# Patient Record
Sex: Female | Born: 1960 | Race: White | Hispanic: No | Marital: Married | Smoking: Never smoker
Health system: Southern US, Community
[De-identification: ages and names within clinical notes are randomized; demographics above are authoritative.]

## PROBLEM LIST (undated history)

## (undated) DIAGNOSIS — I480 Paroxysmal atrial fibrillation: Secondary | ICD-10-CM

## (undated) DIAGNOSIS — R0600 Dyspnea, unspecified: Secondary | ICD-10-CM

## (undated) DIAGNOSIS — I499 Cardiac arrhythmia, unspecified: Secondary | ICD-10-CM

## (undated) DIAGNOSIS — G709 Myoneural disorder, unspecified: Secondary | ICD-10-CM

## (undated) DIAGNOSIS — J189 Pneumonia, unspecified organism: Secondary | ICD-10-CM

## (undated) DIAGNOSIS — M1990 Unspecified osteoarthritis, unspecified site: Secondary | ICD-10-CM

---

## 1998-04-30 ENCOUNTER — Other Ambulatory Visit: Admission: RE | Admit: 1998-04-30 | Discharge: 1998-04-30 | Payer: Self-pay | Admitting: *Deleted

## 2001-09-24 ENCOUNTER — Other Ambulatory Visit: Admission: RE | Admit: 2001-09-24 | Discharge: 2001-09-24 | Payer: Self-pay | Admitting: *Deleted

## 2002-10-07 ENCOUNTER — Other Ambulatory Visit: Admission: RE | Admit: 2002-10-07 | Discharge: 2002-10-07 | Payer: Self-pay | Admitting: *Deleted

## 2002-10-17 ENCOUNTER — Encounter: Admission: RE | Admit: 2002-10-17 | Discharge: 2002-10-17 | Payer: Self-pay | Admitting: *Deleted

## 2003-03-13 ENCOUNTER — Encounter (INDEPENDENT_AMBULATORY_CARE_PROVIDER_SITE_OTHER): Payer: Self-pay | Admitting: Specialist

## 2003-03-13 ENCOUNTER — Ambulatory Visit (HOSPITAL_COMMUNITY): Admission: RE | Admit: 2003-03-13 | Discharge: 2003-03-13 | Payer: Self-pay | Admitting: *Deleted

## 2004-02-29 ENCOUNTER — Encounter: Admission: RE | Admit: 2004-02-29 | Discharge: 2004-02-29 | Payer: Self-pay | Admitting: *Deleted

## 2005-03-28 ENCOUNTER — Encounter: Admission: RE | Admit: 2005-03-28 | Discharge: 2005-03-28 | Payer: Self-pay | Admitting: *Deleted

## 2006-04-12 ENCOUNTER — Encounter: Admission: RE | Admit: 2006-04-12 | Discharge: 2006-04-12 | Payer: Self-pay | Admitting: *Deleted

## 2008-09-24 ENCOUNTER — Encounter: Admission: RE | Admit: 2008-09-24 | Discharge: 2008-09-24 | Payer: Self-pay | Admitting: Obstetrics

## 2009-09-27 ENCOUNTER — Encounter: Admission: RE | Admit: 2009-09-27 | Discharge: 2009-09-27 | Payer: Self-pay | Admitting: Obstetrics

## 2009-10-06 ENCOUNTER — Encounter: Admission: RE | Admit: 2009-10-06 | Discharge: 2009-10-06 | Payer: Self-pay | Admitting: Obstetrics

## 2010-01-04 ENCOUNTER — Encounter (INDEPENDENT_AMBULATORY_CARE_PROVIDER_SITE_OTHER): Payer: Self-pay | Admitting: *Deleted

## 2010-01-21 ENCOUNTER — Encounter (INDEPENDENT_AMBULATORY_CARE_PROVIDER_SITE_OTHER): Payer: Self-pay

## 2010-01-25 ENCOUNTER — Ambulatory Visit: Payer: Self-pay | Admitting: Gastroenterology

## 2010-02-16 ENCOUNTER — Ambulatory Visit: Payer: Self-pay | Admitting: Gastroenterology

## 2010-09-20 NOTE — Letter (Signed)
Summary: [facility] Instructions  [facility]  [address] [place], [place] [address]   Phone: [phone]  Fax: [phone]       Terri Drake    Jan 31, 1961    MRN: [id number]        Procedure Day Dorna Bloom:  [facility]  02/16/10     Arrival Time:  8:30AM     Procedure Time:  9:30AM     Location of Procedure:                    _X _  [facility] (4th Floor)                        PREPARATION FOR COLONOSCOPY WITH MOVIPREP   Starting 5 days prior to your procedure 02/11/10 do not eat nuts, seeds, popcorn, corn, beans, peas,  salads, or any raw vegetables.  Do not take any fiber supplements (e.g. Metamucil, Citrucel, and Benefiber).  THE DAY BEFORE YOUR PROCEDURE         DATE: 02/15/10  DAY: TUESDAY  1.  Drink clear liquids the entire day-NO SOLID FOOD  2.  Do not drink anything colored red or purple.  Avoid juices with pulp.  No orange juice.  3.  Drink at least 64 oz. (8 glasses) of fluid/clear liquids during the day to prevent dehydration and help the prep work efficiently.  CLEAR LIQUIDS INCLUDE: Water Jello Ice Popsicles Tea (sugar ok, no milk/cream) Powdered fruit flavored drinks Coffee (sugar ok, no milk/cream) Gatorade Juice: apple, white grape, white cranberry  Lemonade Clear bullion, consomm, broth Carbonated beverages (any kind) Strained chicken noodle soup Hard Candy                             4.  In the morning, mix first dose of MoviPrep solution:    Empty 1 Pouch A and 1 Pouch B into the disposable container    Add lukewarm drinking water to the top line of the container. Mix to dissolve    Refrigerate (mixed solution should be used within 24 hrs)  5.  Begin drinking the prep at 5:00 p.m. The MoviPrep container is divided by 4 marks.   Every 15 minutes drink the solution down to the next mark (approximately 8 oz) until the full liter is complete.   6.  Follow completed prep with 16 oz of clear liquid of your choice  (Nothing red or purple).  Continue to drink clear liquids until bedtime.  7.  Before going to bed, mix second dose of MoviPrep solution:    Empty 1 Pouch A and 1 Pouch B into the disposable container    Add lukewarm drinking water to the top line of the container. Mix to dissolve    Refrigerate  THE DAY OF YOUR PROCEDURE      DATE: 02/16/10  DAY: WEDNESDAY  Beginning at 4:30AM (5 hours before procedure):         1. Every 15 minutes, drink the solution down to the next mark (approx 8 oz) until the full liter is complete.  2. Follow completed prep with 16 oz. of clear liquid of your choice.    3. You may drink clear liquids until 7:30AM (2 HOURS BEFORE PROCEDURE).   MEDICATION INSTRUCTIONS  Unless otherwise instructed, you should take regular prescription medications with a small sip of water   as early as possible the morning  of your procedure.         OTHER INSTRUCTIONS  You will need a responsible adult at least 51 years of age to accompany you and drive you home.   This person must remain in the waiting room during your procedure.  Wear loose fitting clothing that is easily removed.  Leave jewelry and other valuables at home.  However, you may wish to bring a book to read or  an iPod/MP3 player to listen to music as you wait for your procedure to start.  Remove all body piercing jewelry and leave at home.  Total time from sign-in until discharge is approximately 2-3 hours.  You should go home directly after your procedure and rest.  You can resume normal activities the  day after your procedure.  The day of your procedure you should not:   Drive   Make legal decisions   Operate machinery   Drink alcohol   Return to work  You will receive specific instructions about eating, activities and medications before you leave.    The above instructions have been reviewed and explained to me by   Ulis Rias RN  January 25, 2010 10:14 AM     I fully understand  and can verbalize these instructions _____________________________ Date _________

## 2010-09-20 NOTE — Procedures (Signed)
Summary: Colonoscopy  Patient: [place] Spainhower Note: All result statuses are Final unless otherwise noted.  Tests: (1) Colonoscopy (COL)   COL Colonoscopy           DONE     [facility]     [address]     [place], [place]  [address]           COLONOSCOPY PROCEDURE REPORT           PATIENT:  Terri Drake, Terri Drake  MR#:  [id number]     BIRTHDATE:  04/18/1961, 49 yrs. old  GENDER:  female     ENDOSCOPIST:  Rachael Fee, MD     REF. BY:  Noland Fordyce, M.D.     PROCEDURE DATE:  02/16/2010     PROCEDURE:  Diagnostic Colonoscopy     ASA CLASS:  Class II     INDICATIONS:  Routine Risk Screening     MEDICATIONS:   Fentanyl 75 mcg IV, Versed 8 mg IV           DESCRIPTION OF PROCEDURE:   After the risks benefits and     alternatives of the procedure were thoroughly explained, informed     consent was obtained.  Digital rectal exam was performed and     revealed no rectal masses.   The LB PCF-H180AL [id number] endoscope     was introduced through the anus and advanced to the cecum, which     was identified by both the appendix and ileocecal valve, without     limitations.  The quality of the prep was good, using MoviPrep.     The instrument was then slowly withdrawn as the colon was fully     examined.     <<PROCEDUREIMAGES>>           FINDINGS:  A normal appearing cecum, ileocecal valve, and     appendiceal orifice were identified. The ascending, hepatic     flexure, transverse, splenic flexure, descending, sigmoid colon,     and rectum appeared unremarkable (see image1, image2, and image3).     Retroflexed views in the rectum revealed no abnormalities.    The     scope was then withdrawn from the patient and the procedure     completed.           COMPLICATIONS:  None           ENDOSCOPIC IMPRESSION:     1) Normal colon     2) No polyps or cancers           RECOMMENDATIONS:     1) Continue current colorectal screening recommendations for     "routine risk" patients  with a repeat colonoscopy in 10 years.           REPEAT EXAM:  10 years           ______________________________     Rachael Fee, MD           n.     eSIGNED:   Rachael Fee at 02/16/2010 09:20 AM           Carlynn Spry, [id number]  Note: An exclamation mark (!) indicates a result that was not dispersed into the flowsheet. Document Creation Date: 02/16/2010 9:20 AM _______________________________________________________________________  (1) Order result status: Final Collection or observation date-time: 02/16/2010 09:16 Requested date-time:  Receipt date-time:  Reported date-time:  Referring Physician:   Ordering Physician: Rob Bunting [phone]) Specimen Source:  Source: Launa Grill  Order Number: [id number] Lab site:   Appended Document: Colonoscopy    Clinical Lists Changes  Observations: Added new observation of COLONNXTDUE: 01/2020 (02/16/2010 12:12)

## 2010-09-20 NOTE — Letter (Signed)
Summary: Previsit letter  [facility]  [address] [place], [place] [address]   Phone: [phone]  Fax: [phone]       01/04/2010 MRN: [id number]  Terri Drake [address] [place], [place]  [address]  Dear Ms. Fofana,  Welcome to the Gastroenterology Division at [facility].    You are scheduled to see a nurse for your pre-procedure visit on 01/25/2010 at 10:00AM on the 3rd floor at [facility], [address]  We ask that you try to arrive at our office 15 minutes prior to your appointment time to allow for check-in.  Your nurse visit will consist of discussing your medical and surgical history, your immediate family medical history, and your medications.    Please bring a complete list of all your medications or, if you prefer, bring the medication bottles and we will list them.  We will need to be aware of both prescribed and over the counter drugs.  We will need to know exact dosage information as well.  If you are on blood thinners (Coumadin, Plavix, Aggrenox, Ticlid, etc.) please call our office today/prior to your appointment, as we need to consult with your physician about holding your medication.   Please be prepared to read and sign documents such as consent forms, a financial agreement, and acknowledgement forms.  If necessary, and with your consent, a friend or relative is welcome to sit-in on the nurse visit with you.  Please bring your insurance card so that we may make a copy of it.  If your insurance requires a referral to see a specialist, please bring your referral form from your primary care physician.  No co-pay is required for this nurse visit.     If you cannot keep your appointment, please call [phone] to cancel or reschedule prior to your appointment date.  This allows [place] the opportunity to schedule an appointment for another patient in need of care.    Thank you for choosing  Gastroenterology for your medical  needs.  We appreciate the opportunity to care for you.  Please visit [place] at our website  to learn more about our practice.                     Sincerely.                                                                                                                   The Gastroenterology Division

## 2010-09-20 NOTE — Miscellaneous (Signed)
Summary: Lec previsit  Clinical Lists Changes  Medications: Added new medication of MOVIPREP 100 GM  SOLR (PEG-KCL-NACL-NASULF-NA ASC-C) As per prep instructions. - Signed Rx of MOVIPREP 100 GM  SOLR (PEG-KCL-NACL-NASULF-NA ASC-C) As per prep instructions.;  #1 x 0;  Signed;  Entered by: Ulis Rias RN;  Authorized by: Rachael Fee MD;  Method used: Electronically to [facility]. #[id number]*, [address] [place], [place], [place]  [address], Ph: [phone], Fax: [phone] Allergies: Added new allergy or adverse reaction of * SULFUR Observations: Added new observation of NKA: F (01/25/2010 9:48)    Prescriptions: MOVIPREP 100 GM  SOLR (PEG-KCL-NACL-NASULF-NA ASC-C) As per prep instructions.  #1 x 0   Entered by:   Ulis Rias RN   Authorized by:   Rachael Fee MD   Signed by:   Ulis Rias RN on 01/25/2010   Method used:   Electronically to        [facility]. [phone]* (retail)       [address]       [place], [place]  [address]       Ph: [phone]       Fax: [phone]   RxID:   [id number]

## 2011-01-06 NOTE — Op Note (Signed)
NAME:  Terri Drake, Terri Drake                       ACCOUNT NO.:  [id number]   MEDICAL RECORD NO.:  [id number]                   PATIENT TYPE:  AMB   LOCATION:  DAY                                  FACILITY:  [facility]   PHYSICIAN:  Pershing Cox, M.D.            DATE OF BIRTH:  Mar 30, 1961   DATE OF PROCEDURE:  03/13/2003  DATE OF DISCHARGE:                                 OPERATIVE REPORT   PREOPERATIVE DIAGNOSES:  1. Dysfunctional uterine bleeding.  2. Abnormal hydrosonogram with suggested submucosal myoma and endometrial     polyp.   POSTOPERATIVE DIAGNOSES:  1. Dysfunctional uterine bleeding.  2. Normal endometrial cavity.  3. No evidence of submucosal myoma.   PROCEDURES:  1. Exam under anesthesia.  2. Fractional D&C hysteroscopy.   SURGEON:  Pershing Cox, M.D.   ANESTHESIA:  MAC plus Marcaine paracervical block.   SPECIMENS:  1. Endocervical curettings.  2. Endometrial curettings.   INDICATIONS FOR PROCEDURE:  Since March of this year, the patient has had  two menstrual cycles.  Each of these lasted 7-8 days, and then there was  bleeding between the cycles lasting 3-4 days.  She had a hydrosonogram  performed in my office which showed a very thickened lining of the anterior  uterine wall and question of a submucosal myoma in the lower uterine  segment.  This led [place] today to the operating room to assess the presence of  a submucosal myoma and to remove any small polyps.   FINDINGS:  Three inch cavity, no submucosal myoma seen, fragments of  polypoid endometrium submitted for D&C.  No uterine descensus.  The  patient's uterus is anteverted.  It is approximately eight weeks in size.  No uterine myomas were palpated on an exam under anesthesia, and no uterine  myomas were seen in the endometrial canal.  Both tubal ostia were seen.  There were fragments of polypoid endometrium, and attempts were made to  curette all of these fragments at the end of the procedure.   Photographs  were taken to document our activities.   DESCRIPTION OF PROCEDURE:  Terri Drake was brought to the operating room  with an IV in place.  She received 1 g of Ancef in the holding area.  Supine  on the OR table, IV sedation was administered.  General anesthesia was  established by placement of an LMA without difficulty.  The patient was  placed into Allen stirrups.  Exam under anesthesia was performed.  Hibiclens  was used to prep the anterior abdominal wall, perineum, and vagina.  The  patient was draped for a sterile vaginal procedure.   Bivalve speculum was inserted to the vagina.  Cervix was visualized.  It was  grasped with a single-tooth tenaculum and 0.25% Marcaine was used to  administer a paracervical block to the 3, 4, 7, and 8 positions.  Kevorkian  curette was used to obtain endocervical curettings onto  a Telfa.  Serial  Pratt dilators were used to dilate the cervix to size 31 Pratt.  The  hysteroscope was then inserted.  Using using through-and-through sorbitol  irrigation, the cavity was visualized and photographed.  Seeing no evidence  of large polyp and no evidence of submucosal myoma, the hysteroscope was  removed.  Small sharp curette was used to curette the wall in a serial  fashion.  The serrated curette was then used to curette the walls as well.  There was a feeling of a sliding piece of tissue on the anterior uterine  wall, and so I curetted this vigorously and used the Randall-Stone forceps  to try to explore for polypoid fragments.  All of the resected material was  placed on Telfa and sent to pathology for diagnosis.  The procedure was  terminated after the uterine passed again to a depth of three inches.  There  was no evidence of perforation during this procedure.  The patient was taken  to the recovery room in excellent condition where she will be discharged to  home.                                               Pershing Cox,  M.D.    MAJ/MEDQ  D:  03/13/2003  T:  03/13/2003  Job:  [id number]

## 2011-02-03 ENCOUNTER — Other Ambulatory Visit: Payer: Self-pay | Admitting: Obstetrics

## 2011-02-03 DIAGNOSIS — Z1231 Encounter for screening mammogram for malignant neoplasm of breast: Secondary | ICD-10-CM

## 2011-03-01 ENCOUNTER — Ambulatory Visit
Admission: RE | Admit: 2011-03-01 | Discharge: 2011-03-01 | Disposition: A | Discharge status: Home / Self Care | Payer: BC Managed Care – PPO | Source: Ambulatory Visit | Attending: Obstetrics | Admitting: Obstetrics

## 2011-03-01 DIAGNOSIS — Z1231 Encounter for screening mammogram for malignant neoplasm of breast: Secondary | ICD-10-CM

## 2011-03-21 ENCOUNTER — Other Ambulatory Visit: Payer: Self-pay | Admitting: Podiatry

## 2011-03-21 DIAGNOSIS — M79672 Pain in left foot: Secondary | ICD-10-CM

## 2011-03-21 DIAGNOSIS — M25572 Pain in left ankle and joints of left foot: Secondary | ICD-10-CM

## 2011-03-27 ENCOUNTER — Other Ambulatory Visit: Payer: BC Managed Care – PPO

## 2011-03-29 ENCOUNTER — Ambulatory Visit
Admission: RE | Admit: 2011-03-29 | Discharge: 2011-03-29 | Disposition: A | Discharge status: Home / Self Care | Payer: BC Managed Care – PPO | Source: Ambulatory Visit | Attending: Podiatry | Admitting: Podiatry

## 2011-03-29 DIAGNOSIS — M25572 Pain in left ankle and joints of left foot: Secondary | ICD-10-CM

## 2011-03-29 DIAGNOSIS — M79672 Pain in left foot: Secondary | ICD-10-CM

## 2012-10-29 ENCOUNTER — Other Ambulatory Visit: Payer: Self-pay

## 2012-10-29 DIAGNOSIS — Z1231 Encounter for screening mammogram for malignant neoplasm of breast: Secondary | ICD-10-CM

## 2012-11-28 ENCOUNTER — Ambulatory Visit
Admission: RE | Admit: 2012-11-28 | Discharge: 2012-11-28 | Disposition: A | Discharge status: Home / Self Care | Payer: BC Managed Care – PPO | Source: Ambulatory Visit

## 2012-11-28 DIAGNOSIS — Z1231 Encounter for screening mammogram for malignant neoplasm of breast: Secondary | ICD-10-CM

## 2013-03-28 ENCOUNTER — Other Ambulatory Visit: Payer: Self-pay | Admitting: Orthopaedic Surgery

## 2013-03-28 DIAGNOSIS — M25531 Pain in right wrist: Secondary | ICD-10-CM

## 2013-04-04 ENCOUNTER — Ambulatory Visit
Admission: RE | Admit: 2013-04-04 | Discharge: 2013-04-04 | Disposition: A | Discharge status: Home / Self Care | Payer: BC Managed Care – PPO | Source: Ambulatory Visit | Attending: Orthopaedic Surgery | Admitting: Orthopaedic Surgery

## 2013-04-04 DIAGNOSIS — M25531 Pain in right wrist: Secondary | ICD-10-CM

## 2013-04-04 MED ORDER — IOHEXOL 180 MG/ML  SOLN
7.5000 mL | Freq: Once | INTRAMUSCULAR | Status: AC | PRN
  Administered 2013-04-04: 7.5 mL via INTRA_ARTICULAR

## 2013-12-23 ENCOUNTER — Other Ambulatory Visit: Payer: Self-pay

## 2013-12-23 DIAGNOSIS — Z1231 Encounter for screening mammogram for malignant neoplasm of breast: Secondary | ICD-10-CM

## 2014-01-05 ENCOUNTER — Ambulatory Visit
Admission: RE | Admit: 2014-01-05 | Discharge: 2014-01-05 | Disposition: A | Discharge status: Home / Self Care | Payer: BC Managed Care – PPO | Source: Ambulatory Visit

## 2014-01-05 DIAGNOSIS — Z1231 Encounter for screening mammogram for malignant neoplasm of breast: Secondary | ICD-10-CM

## 2014-05-19 ENCOUNTER — Encounter: Payer: Self-pay | Admitting: Gastroenterology

## 2014-08-25 ENCOUNTER — Other Ambulatory Visit: Payer: Self-pay | Admitting: Family Medicine

## 2014-08-25 ENCOUNTER — Ambulatory Visit
Admission: RE | Admit: 2014-08-25 | Discharge: 2014-08-25 | Disposition: A | Discharge status: Home / Self Care | Payer: BC Managed Care – PPO | Source: Ambulatory Visit | Attending: Family Medicine | Admitting: Family Medicine

## 2014-08-25 DIAGNOSIS — M545 Low back pain: Secondary | ICD-10-CM

## 2021-01-13 ENCOUNTER — Telehealth: Payer: Self-pay | Admitting: Cardiovascular Disease

## 2021-01-13 NOTE — Telephone Encounter (Signed)
Dr. Excell Seltzer personally spoke with Dr. Luciana Axe. Per Dr. Luciana Axe, HR in 130s, EKG shows afib/flutter. She will start the patient on Eliquis 5 mg BID, Toprol 25 mg daily, and will arrange AFib Clinic visit.  Med list updated.  Will call the patient tomorrow to confirm appointment 6/1 at 1400 and give directions.    AFIB CLINIC INFORMATION: Your appointment is scheduled on: 01/19/21 at 2:00PM Please arrive 15 minutes early for check-in. The AFib Clinic is located in the Heart and Vascular [facility]ty Clinics at [facility]. Parking instructions/directions: Government social research [address] ([address]). When you pull in to Entrance C, there is an underground parking garage to your right. The code to enter the garage is 4233. Take the elevators to the first floor. Follow the signs to the Heart and Vascular Specialty Clinics. You will see registration at the end of the hallway.  Phone number: [phone]

## 2021-01-13 NOTE — Telephone Encounter (Signed)
Spoke with Dr Luciana Axe. Pt completely asymptomatic. Recommendations as outlined.

## 2021-01-13 NOTE — Telephone Encounter (Signed)
Dr. Luciana Axe calling to speak with DOD in regards to patient having afib flutter and being asymptomatic.

## 2021-01-14 NOTE — Telephone Encounter (Signed)
Spoke with pt and reviewed information about appt with AF Clinic.  Gave directions, parking code and phone number.  Pt appreciative for call.

## 2021-01-19 ENCOUNTER — Encounter (HOSPITAL_COMMUNITY): Payer: Self-pay | Admitting: Physician Assistant

## 2021-01-19 ENCOUNTER — Other Ambulatory Visit: Payer: Self-pay

## 2021-01-19 ENCOUNTER — Ambulatory Visit (HOSPITAL_COMMUNITY)
Admission: RE | Admit: 2021-01-19 | Discharge: 2021-01-19 | Disposition: A | Discharge status: Home / Self Care | Payer: BC Managed Care – PPO | Source: Ambulatory Visit | Attending: Physician Assistant | Admitting: Physician Assistant

## 2021-01-19 VITALS — BP 152/96 | HR 53 | Ht 66.75 in | Wt 187.2 lb

## 2021-01-19 DIAGNOSIS — R030 Elevated blood-pressure reading, without diagnosis of hypertension: Secondary | ICD-10-CM | POA: Insufficient documentation

## 2021-01-19 DIAGNOSIS — Z79899 Other long term (current) drug therapy: Secondary | ICD-10-CM | POA: Insufficient documentation

## 2021-01-19 DIAGNOSIS — I480 Paroxysmal atrial fibrillation: Secondary | ICD-10-CM | POA: Diagnosis present

## 2021-01-19 DIAGNOSIS — Z7901 Long term (current) use of anticoagulants: Secondary | ICD-10-CM | POA: Insufficient documentation

## 2021-01-19 DIAGNOSIS — Z882 Allergy status to sulfonamides status: Secondary | ICD-10-CM | POA: Diagnosis not present

## 2021-01-19 DIAGNOSIS — R001 Bradycardia, unspecified: Secondary | ICD-10-CM | POA: Insufficient documentation

## 2021-01-19 NOTE — Progress Notes (Addendum)
Primary Care Physician: Clayborn Heron, MD Primary Cardiologist: none Primary Electrophysiologist: none Referring Physician: Dr Oneida Arenas is a 60 y.o. female with a history of new onset atrial fibrillation who presents for consultation in the [facility].  The patient was initially diagnosed with atrial fibrillation 01/13/21 incidentally at a routine follow up at PCP. ECG showed afib with RVR. Patient is on Eliquis for a CHADS2VASC score of 1. She was also started on metoprolol for rate control. She denies significant snore but does have a history of alcohol and caffeine use. She is back in SR today.  Today, she denies symptoms of palpitations, chest pain, shortness of breath, orthopnea, PND, lower extremity edema, dizziness, presyncope, syncope, snoring, daytime somnolence, bleeding, or neurologic sequela. The patient is tolerating medications without difficulties and is otherwise without complaint today.    Atrial Fibrillation Risk Factors:  she does not have symptoms or diagnosis of sleep apnea. she does not have a history of rheumatic fever. she does have a history of alcohol use. The patient does not have a history of early familial atrial fibrillation or other arrhythmias.  she has a BMI of Body mass index is 29.54 kg/m.Marland Kitchen Filed Weights   01/19/21 1400  Weight: 84.9 kg    History reviewed. No pertinent family history.   Atrial Fibrillation Management history:  Previous antiarrhythmic drugs: none Previous cardioversions: none Previous ablations: none CHADS2VASC score: 1 Anticoagulation history: Eliquis   History reviewed. No pertinent past medical history. History reviewed. No pertinent surgical history.  Current Outpatient Medications  Medication Sig Dispense Refill  . apixaban (ELIQUIS) 5 MG TABS tablet Take 5 mg by mouth 2 (two) times daily.    Marland Kitchen latanoprost (XALATAN) 0.005 % ophthalmic solution 1 drop at  bedtime.    . metoprolol succinate (TOPROL-XL) 25 MG 24 hr tablet Take 25 mg by mouth daily.     No current facility-administered medications for this encounter.    Allergies  Allergen Reactions  . Elemental Sulfur     REACTION: hives  . Sulfa Antibiotics Rash    Social History   Socioeconomic History  . Marital status: Married    Spouse name: Not on file  . Number of children: Not on file  . Years of education: Not on file  . Highest education level: Not on file  Occupational History  . Not on file  Tobacco Use  . Smoking status: Not on file  . Smokeless tobacco: Not on file  Substance and Sexual Activity  . Alcohol use: Not on file  . Drug use: Not on file  . Sexual activity: Not on file  Other Topics Concern  . Not on file  Social History Narrative  . Not on file   Social Determinants of Health   Financial Resource Strain: Not on file  Food Insecurity: Not on file  Transportation Needs: Not on file  Physical Activity: Not on file  Stress: Not on file  Social Connections: Not on file  Intimate Partner Violence: Not on file     ROS- All systems are reviewed and negative except as per the HPI above.  Physical Exam: Vitals:   01/19/21 1400  BP: (!) 152/96  Pulse: (!) 53  Weight: 84.9 kg  Height: 5' 6.75" (1.695 m)    GEN- The patient is a well appearing female, alert and oriented x 3 today.   Head- normocephalic, atraumatic Eyes-  Sclera clear, conjunctiva pink Ears- hearing intact  Oropharynx- clear Neck- supple  Lungs- Clear to ausculation bilaterally, normal work of breathing Heart- Regular rate and rhythm, bradycardia, no murmurs, rubs or gallops  GI- soft, NT, ND, + BS Extremities- no clubbing, cyanosis, or edema MS- no significant deformity or atrophy Skin- no rash or lesion Psych- euthymic mood, full affect Neuro- strength and sensation are intact  Wt Readings from Last 3 Encounters:  01/19/21 84.9 kg    EKG today demonstrates   SB Vent. rate 53 BPM PR interval 162 ms QRS duration 78 ms QT/QTcB 468/439 ms  Epic records are reviewed at length today  Labs from PCP 01/13/21 Cr 0.88, K+ 4.6, AST 21, ALT 19 WBC 5.9, Hgb 14.8, Hct 43.2 TSH 2.18  CHA2DS2-VASc Score = 1  The patient's score is based upon: CHF History: No HTN History: No Diabetes History: No Stroke History: No Vascular Disease History: No Age Score: 0 Gender Score: 1      ASSESSMENT AND PLAN: 1. Paroxysmal Atrial Fibrillation (ICD10:  I48.0) The patient's CHA2DS2-VASc score is 1, indicating a 0.6% annual risk of stroke.   General education about afib provided and questions answered. We also discussed her stroke risk and the risks and benefits of anticoagulation. Check echocardiogram Continue Eliquis 5 mg BID for at least one month post spontaneous conversion. Continue Toprol 25 mg daily  2. Elevated BP BP mildly elevated today, she does not have a h/o HTN. Encouraged her to get home BP machine to trend over time.   Follow up in the AF clinic in one month.    Jorja Loa PA-C Afib Clinic [facility] [address] [place], [place] [address] [phone] 01/19/2021 3:58 PM

## 2021-01-19 NOTE — Addendum Note (Signed)
Encounter addended by: Danice Goltz, PA on: 01/19/2021 4:04 PM  Actions taken: Clinical Note Signed

## 2021-01-20 ENCOUNTER — Other Ambulatory Visit (HOSPITAL_COMMUNITY): Payer: Self-pay

## 2021-01-20 MED ORDER — METOPROLOL SUCCINATE ER 25 MG PO TB24: 25.0000 mg | ORAL_TABLET | Freq: Every day | ORAL | 1 refills | Status: DC

## 2021-02-24 ENCOUNTER — Ambulatory Visit (HOSPITAL_COMMUNITY)
Admission: RE | Admit: 2021-02-24 | Discharge: 2021-02-24 | Disposition: A | Discharge status: Home / Self Care | Payer: BC Managed Care – PPO | Source: Ambulatory Visit | Attending: Physician Assistant | Admitting: Physician Assistant

## 2021-02-24 ENCOUNTER — Other Ambulatory Visit: Payer: Self-pay

## 2021-02-24 DIAGNOSIS — I082 Rheumatic disorders of both aortic and tricuspid valves: Secondary | ICD-10-CM | POA: Diagnosis not present

## 2021-02-24 DIAGNOSIS — I119 Hypertensive heart disease without heart failure: Secondary | ICD-10-CM | POA: Insufficient documentation

## 2021-02-24 DIAGNOSIS — I480 Paroxysmal atrial fibrillation: Secondary | ICD-10-CM | POA: Diagnosis present

## 2021-02-24 LAB — ECHOCARDIOGRAM COMPLETE
Area-P 1/2: 2.67 cm2
S' Lateral: 2.7 cm
Single Plane A4C EF: 67.1 %

## 2021-02-24 NOTE — Progress Notes (Signed)
  Echocardiogram 2D Echocardiogram has been performed.  Terri Drake 02/24/2021, 2:36 PM

## 2021-03-03 ENCOUNTER — Encounter (HOSPITAL_COMMUNITY): Payer: Self-pay | Admitting: Physician Assistant

## 2021-03-03 ENCOUNTER — Other Ambulatory Visit: Payer: Self-pay

## 2021-03-03 ENCOUNTER — Ambulatory Visit (HOSPITAL_COMMUNITY)
Admission: RE | Admit: 2021-03-03 | Discharge: 2021-03-03 | Disposition: A | Discharge status: Home / Self Care | Payer: BC Managed Care – PPO | Source: Ambulatory Visit | Attending: Physician Assistant | Admitting: Physician Assistant

## 2021-03-03 VITALS — BP 136/90 | HR 49 | Ht 66.75 in | Wt 186.8 lb

## 2021-03-03 DIAGNOSIS — Z882 Allergy status to sulfonamides status: Secondary | ICD-10-CM | POA: Diagnosis not present

## 2021-03-03 DIAGNOSIS — Z8616 Personal history of COVID-19: Secondary | ICD-10-CM | POA: Insufficient documentation

## 2021-03-03 DIAGNOSIS — I480 Paroxysmal atrial fibrillation: Secondary | ICD-10-CM

## 2021-03-03 DIAGNOSIS — Z79899 Other long term (current) drug therapy: Secondary | ICD-10-CM | POA: Insufficient documentation

## 2021-03-03 DIAGNOSIS — Z7901 Long term (current) use of anticoagulants: Secondary | ICD-10-CM | POA: Insufficient documentation

## 2021-03-03 HISTORY — DX: Paroxysmal atrial fibrillation: I48.0

## 2021-03-03 MED ORDER — METOPROLOL SUCCINATE ER 25 MG PO TB24: 12.5000 mg | ORAL_TABLET | Freq: Every day | ORAL | 1 refills | Status: DC

## 2021-03-03 NOTE — Progress Notes (Signed)
Primary Care Physician: Clayborn Heron, MD Primary Cardiologist: none Primary Electrophysiologist: none Referring Physician: Dr Oneida Arenas is a 60 y.o. female with a history of new onset atrial fibrillation who presents for follow up in the [facility].  The patient was initially diagnosed with atrial fibrillation 01/13/21 incidentally at a routine follow up at PCP. ECG showed afib with RVR. Patient is on Eliquis for a CHADS2VASC score of 1. She was also started on metoprolol for rate control. She denies significant snore but does have a history of alcohol and caffeine use.   On follow up today, patient reports that she has done well since her last visit. She is in SR today. Echo showed normal EF 60-65%. She denies any bleeding issues on anticoagulation. Of note, she did have COVID since her previous visit.   Today, she denies symptoms of palpitations, chest pain, shortness of breath, orthopnea, PND, lower extremity edema, dizziness, presyncope, syncope, snoring, daytime somnolence, bleeding, or neurologic sequela. The patient is tolerating medications without difficulties and is otherwise without complaint today.    Atrial Fibrillation Risk Factors:  she does not have symptoms or diagnosis of sleep apnea. she does not have a history of rheumatic fever. she does have a history of alcohol use. The patient does not have a history of early familial atrial fibrillation or other arrhythmias.  she has a BMI of Body mass index is 29.48 kg/m.Marland Kitchen Filed Weights   03/03/21 1357  Weight: 84.7 kg     No family history on file.   Atrial Fibrillation Management history:  Previous antiarrhythmic drugs: none Previous cardioversions: none Previous ablations: none CHADS2VASC score: 1 Anticoagulation history: Eliquis   Past Medical History:  Diagnosis Date   Paroxysmal atrial fibrillation (HCC)    No past surgical history on file.  Current  Outpatient Medications  Medication Sig Dispense Refill   latanoprost (XALATAN) 0.005 % ophthalmic solution 1 drop at bedtime.     metoprolol succinate (TOPROL-XL) 25 MG 24 hr tablet Take 0.5 tablets (12.5 mg total) by mouth daily. 45 tablet 1   No current facility-administered medications for this encounter.    Allergies  Allergen Reactions   Elemental Sulfur     REACTION: hives   Sulfa Antibiotics Rash    Social History   Socioeconomic History   Marital status: Married    Spouse name: Not on file   Number of children: Not on file   Years of education: Not on file   Highest education level: Not on file  Occupational History   Not on file  Tobacco Use   Smoking status: Never   Smokeless tobacco: Never  Substance and Sexual Activity   Alcohol use: Yes    Alcohol/week: 2.0 standard drinks    Types: 2 Glasses of wine per week    Comment: 2 glasses nightly   Drug use: Never   Sexual activity: Not on file  Other Topics Concern   Not on file  Social History Narrative   Not on file   Social Determinants of Health   Financial Resource Strain: Not on file  Food Insecurity: Not on file  Transportation Needs: Not on file  Physical Activity: Not on file  Stress: Not on file  Social Connections: Not on file  Intimate Partner Violence: Not on file     ROS- All systems are reviewed and negative except as per the HPI above.  Physical Exam: Vitals:   03/03/21 1357  BP: 136/90  Pulse: (!) 49  Weight: 84.7 kg  Height: 5' 6.75" (1.695 m)    GEN- The patient is a well appearing female, alert and oriented x 3 today.   HEENT-head normocephalic, atraumatic, sclera clear, conjunctiva pink, hearing intact, trachea midline. Lungs- Clear to ausculation bilaterally, normal work of breathing Heart- Regular rate and rhythm, bradycardia, no murmurs, rubs or gallops  GI- soft, NT, ND, + BS Extremities- no clubbing, cyanosis, or edema MS- no significant deformity or atrophy Skin-  no rash or lesion Psych- euthymic mood, full affect Neuro- strength and sensation are intact   Wt Readings from Last 3 Encounters:  03/03/21 84.7 kg  01/19/21 84.9 kg    EKG today demonstrates  SB Vent. rate 49 BPM PR interval 152 ms QRS duration 74 ms QT/QTcB 486/439 ms  Echo 02/24/21 demonstrated   1. Left ventricular ejection fraction, by estimation, is 60 to 65%. The  left ventricle has normal function. The left ventricle has no regional  wall motion abnormalities. Left ventricular diastolic parameters were  normal.   2. Right ventricular systolic function is normal. The right ventricular  size is normal. Tricuspid regurgitation signal is inadequate for assessing  PA pressure.   3. The mitral valve is normal in structure. No evidence of mitral valve  regurgitation.   4. The aortic valve is tricuspid. Aortic valve regurgitation is trivial.  No aortic stenosis is present.   5. The inferior vena cava is normal in size with greater than 50%  respiratory variability, suggesting right atrial pressure of 3 mmHg.   Epic records are reviewed at length today  Labs from PCP 01/13/21 Cr 0.88, K+ 4.6, AST 21, ALT 19 WBC 5.9, Hgb 14.8, Hct 43.2 TSH 2.18  CHA2DS2-VASc Score = 1  The patient's score is based upon: CHF History: No HTN History: No Diabetes History: No Stroke History: No Vascular Disease History: No Age Score: 0 Gender Score: 1      ASSESSMENT AND PLAN: 1. Paroxysmal Atrial Fibrillation (ICD10:  I48.0) The patient's CHA2DS2-VASc score is 1, indicating a 0.6% annual risk of stroke.   Patient appears to be maintaining SR. Stop Eliquis given low CV score. Decrease Toprol to 12.5 mg daily    Follow up in the AF clinic in 6 months.    Jorja Loa PA-C Afib Clinic [facility] [address] [place], [place] [address] [phone] 03/03/2021 3:07 PM

## 2021-03-03 NOTE — Patient Instructions (Signed)
Stop Eliquis  Decrease metoprolol to 1/2 tablet once a day  Follow up in 6 months

## 2021-04-03 ENCOUNTER — Emergency Department (HOSPITAL_COMMUNITY)
Admission: EM | Admit: 2021-04-03 | Discharge: 2021-04-03 | Disposition: A | Discharge status: Home / Self Care | Payer: BC Managed Care – PPO | Attending: Emergency Medicine | Admitting: Emergency Medicine

## 2021-04-03 ENCOUNTER — Encounter (HOSPITAL_COMMUNITY): Payer: Self-pay | Admitting: Emergency Medicine

## 2021-04-03 ENCOUNTER — Emergency Department (HOSPITAL_COMMUNITY): Payer: BC Managed Care – PPO

## 2021-04-03 DIAGNOSIS — R110 Nausea: Secondary | ICD-10-CM | POA: Diagnosis not present

## 2021-04-03 DIAGNOSIS — R42 Dizziness and giddiness: Secondary | ICD-10-CM | POA: Diagnosis not present

## 2021-04-03 DIAGNOSIS — R002 Palpitations: Secondary | ICD-10-CM | POA: Diagnosis present

## 2021-04-03 DIAGNOSIS — Z7982 Long term (current) use of aspirin: Secondary | ICD-10-CM | POA: Diagnosis not present

## 2021-04-03 DIAGNOSIS — I480 Paroxysmal atrial fibrillation: Secondary | ICD-10-CM | POA: Insufficient documentation

## 2021-04-03 DIAGNOSIS — Z20822 Contact with and (suspected) exposure to covid-19: Secondary | ICD-10-CM | POA: Insufficient documentation

## 2021-04-03 LAB — CBC
HCT: 45 % (ref 36.0–46.0)
Hemoglobin: 15.7 g/dL — ABNORMAL HIGH (ref 12.0–15.0)
MCH: 34.3 pg — ABNORMAL HIGH (ref 26.0–34.0)
MCHC: 34.9 g/dL (ref 30.0–36.0)
MCV: 98.3 fL (ref 80.0–100.0)
Platelets: 240 10*3/uL (ref 150–400)
RBC: 4.58 MIL/uL (ref 3.87–5.11)
RDW: 12.8 % (ref 11.5–15.5)
WBC: 7.7 10*3/uL (ref 4.0–10.5)
nRBC: 0 % (ref 0.0–0.2)

## 2021-04-03 LAB — RESP PANEL BY RT-PCR (FLU A&B, COVID) ARPGX2
Influenza A by PCR: NEGATIVE
Influenza B by PCR: NEGATIVE
SARS Coronavirus 2 by RT PCR: NEGATIVE

## 2021-04-03 LAB — TROPONIN I (HIGH SENSITIVITY): Troponin I (High Sensitivity): 3 ng/L (ref <18)

## 2021-04-03 LAB — T4, FREE: Free T4: 1.09 ng/dL (ref 0.61–1.12)

## 2021-04-03 LAB — TSH: TSH: 0.695 u[IU]/mL (ref 0.350–4.500)

## 2021-04-03 MED ORDER — ASPIRIN 325 MG PO TABS
325.0000 mg | ORAL_TABLET | Freq: Once | ORAL | Status: AC
  Administered 2021-04-03: 325 mg via ORAL
  Filled 2021-04-03: qty 1

## 2021-04-03 MED ORDER — ASPIRIN 81 MG PO CHEW: 81.0000 mg | CHEWABLE_TABLET | Freq: Every day | ORAL | 0 refills | Status: DC

## 2021-04-03 MED ORDER — METOPROLOL TARTRATE 25 MG PO TABS
25.0000 mg | ORAL_TABLET | Freq: Once | ORAL | Status: AC
  Administered 2021-04-03: 25 mg via ORAL
  Filled 2021-04-03: qty 1

## 2021-04-03 MED ORDER — DILTIAZEM HCL-DEXTROSE 125-5 MG/125ML-% IV SOLN (PREMIX): 5.0000 mg/h | INTRAVENOUS | Status: DC

## 2021-04-03 MED ORDER — METOPROLOL SUCCINATE ER 25 MG PO TB24: 25.0000 mg | ORAL_TABLET | Freq: Every day | ORAL | 1 refills | Status: DC

## 2021-04-03 NOTE — ED Notes (Signed)
Urine sample at bedside

## 2021-04-03 NOTE — Discharge Instructions (Addendum)
Please follow up with AFIB clinic in next 24 hours. Thanks!

## 2021-04-03 NOTE — ED Notes (Signed)
Spoke with MD, decided to cancel dilt drip due to HR being better and second troponin due to first being regular

## 2021-04-03 NOTE — ED Triage Notes (Signed)
Pt bib ems from [facility] c/o afib rvr. Pt was recently dx afib in May 2022. She was placed on a beta blocker and blood thinner at the time. The beta blocker was reduced July 14th and the blood thinners were stopped. She presented with dizziness, diaphoresis, and nauseous during onset of afib rvr HR 140. Pt denies chest pain and sob.   BP 164/100 CBG 114 Room air 98%

## 2021-04-03 NOTE — ED Provider Notes (Signed)
[facility] EMERGENCY DEPARTMENT Provider Note   CSN: [id number] Arrival date & time: 04/03/21  1315     History No chief complaint on file.   Terri Drake is a 60 y.o. female.  History of atrial fibrillation, not currently on anticoagulation due to discontinue proxy 1 month ago atrial fibrillation clinic.  She is on metoprolol for rate control.  She was sent to emergency department from clinic secondary to elevated heart rate, diaphoretic, nauseated, lightheaded. Room spinning sensation, worsened with head moving or ambulating; improved with rest and sitting still. Symptom onset yesterday afternoon after eating lunch. She has been compliant with her home medications. Last took beta blocker this morning. She has ongoing chest discomfort, palpations. No dyspnea, leg swelling. No fevers or chills. No longer nauseated.   The history is provided by the patient. No language interpreter was used.      Past Medical History:  Diagnosis Date   Paroxysmal atrial fibrillation [facility])     Patient Active Problem List   Diagnosis Date Noted   Paroxysmal atrial fibrillation (HCC) 01/19/2021    History reviewed. No pertinent surgical history.   OB History   No obstetric history on file.     History reviewed. No pertinent family history.  Social History   Tobacco Use   Smoking status: Never   Smokeless tobacco: Never  Substance Use Topics   Alcohol use: Yes    Alcohol/week: 2.0 standard drinks    Types: 2 Glasses of wine per week    Comment: 2 glasses nightly   Drug use: Never    Home Medications Prior to Admission medications   Medication Sig Start Date End Date Taking? Authorizing Provider  aspirin 81 MG chewable tablet Chew 1 tablet (81 mg total) by mouth daily. 04/03/21 05/03/21 Yes Tanda Rockers A, DO  latanoprost (XALATAN) 0.005 % ophthalmic solution Place 1 drop into both eyes at bedtime. 01/07/21  Yes [provider]  metoprolol succinate  (TOPROL-XL) 25 MG 24 hr tablet Take 1 tablet (25 mg total) by mouth daily. 04/03/21   Sloan Leiter, DO    Allergies    Elemental sulfur and Sulfa antibiotics  Review of Systems   Review of Systems  Constitutional:  Positive for diaphoresis. Negative for chills and fever.  HENT:  Negative for facial swelling and trouble swallowing.   Eyes:  Negative for photophobia and visual disturbance.  Respiratory:  Negative for cough and shortness of breath.   Cardiovascular:  Positive for chest pain. Negative for palpitations.  Gastrointestinal:  Positive for nausea. Negative for abdominal pain and vomiting.  Endocrine: Negative for polydipsia and polyuria.  Genitourinary:  Negative for difficulty urinating and hematuria.  Musculoskeletal:  Negative for gait problem and joint swelling.  Skin:  Negative for pallor and rash.  Neurological:  Positive for light-headedness. Negative for syncope and headaches.  Psychiatric/Behavioral:  Negative for agitation and confusion.    Physical Exam Updated Vital Signs BP 121/81   Pulse 99   Temp (!) 97.5 F (36.4 C) (Oral)   Resp 15   SpO2 98%   Physical Exam Vitals and nursing note reviewed.  Constitutional:      General: She is not in acute distress.    Appearance: Normal appearance.  HENT:     Head: Normocephalic and atraumatic.     Right Ear: External ear normal.     Left Ear: External ear normal.     Nose: Nose normal.     Mouth/Throat:  Mouth: Mucous membranes are moist.  Eyes:     General: No scleral icterus.       Right eye: No discharge.        Left eye: No discharge.     Extraocular Movements: Extraocular movements intact.     Pupils: Pupils are equal, round, and reactive to light.  Cardiovascular:     Rate and Rhythm: Tachycardia present. Rhythm irregular.     Pulses: Normal pulses.     Heart sounds: Normal heart sounds.  Pulmonary:     Effort: Pulmonary effort is normal. No respiratory distress.     Breath sounds: Normal  breath sounds.  Abdominal:     General: Abdomen is flat.     Tenderness: There is no abdominal tenderness.  Musculoskeletal:        General: Normal range of motion.     Cervical back: Normal range of motion.     Right lower leg: No edema.     Left lower leg: No edema.  Skin:    General: Skin is warm and dry.     Capillary Refill: Capillary refill takes less than 2 seconds.  Neurological:     Mental Status: She is alert and oriented to person, place, and time.     GCS: GCS eye subscore is 4. GCS verbal subscore is 5. GCS motor subscore is 6.     Cranial Nerves: Cranial nerves are intact.     Sensory: Sensation is intact.     Motor: Motor function is intact.     Coordination: Coordination is intact.     Gait: Gait is intact.  Psychiatric:        Mood and Affect: Mood normal.        Behavior: Behavior normal.    ED Results / Procedures / Treatments   Labs (all labs ordered are listed, but only abnormal results are displayed) Labs Reviewed  CBC - Abnormal; Notable for the following components:      Result Value   Hemoglobin 15.7 (*)    MCH 34.3 (*)    All other components within normal limits  RESP PANEL BY RT-PCR (FLU A&B, COVID) [id number]  TSH  T4, FREE  TROPONIN I (HIGH SENSITIVITY)    EKG EKG Interpretation  Date/Time:  Sunday April 03 2021 13:22:42 EDT Ventricular Rate:  134 PR Interval:    QRS Duration: 80 QT Interval:  305 QTC Calculation: 456 R Axis:   72 Text Interpretation: Atrial fibrillation with RVR Borderline repolarization abnormality Confirmed by Tanda Rockers ([id number]) on 04/03/2021 1:27:23 PM  Radiology DG Chest Port 1 View  Result Date: 04/03/2021 CLINICAL DATA:  Atrial fibrillation, diaphoresis, chest pain. EXAM: PORTABLE CHEST 1 VIEW COMPARISON:  None. FINDINGS: Heart size and mediastinal contours are within normal limits. Coarse interstitial markings bilaterally, basilar predominant, of uncertain chronicity. No pleural effusion or pneumothorax is seen.  Osseous structures about the chest are unremarkable. IMPRESSION: 1. Coarse interstitial markings bilaterally, basilar predominant, without prior exams to establish chronicity, compatible with either chronic interstitial lung disease/fibrosis or acute edema of infectious or inflammatory etiology. 2. No evidence of consolidating pneumonia or alveolar pulmonary edema. Electronically Signed   By: Bary Richard M.D.   On: 04/03/2021 13:49    Procedures .Critical Care  Date/Time: 04/03/2021 9:25 PM Performed by: Sloan Leiter, DO Authorized by: Sloan Leiter, DO   Critical care provider statement:    Critical care time (minutes):  32   Critical care time was exclusive of:  Separately billable procedures and treating other patients   Critical care was necessary to treat or prevent imminent or life-threatening deterioration of the following conditions: atrial fibrillation with RVR.   Critical care was time spent personally by me on the following activities:  Discussions with consultants, evaluation of patient's response to treatment, examination of patient, ordering and performing treatments and interventions, ordering and review of laboratory studies, ordering and review of radiographic studies, pulse oximetry, re-evaluation of patient's condition, obtaining history from patient or surrogate and review of old charts   Medications Ordered in ED Medications  metoprolol tartrate (LOPRESSOR) tablet 25 mg (25 mg Oral Given 04/03/21 1406)  aspirin tablet 325 mg (325 mg Oral Given 04/03/21 1357)    ED Course  I have reviewed the triage vital signs and the nursing notes.  Pertinent labs & imaging results that were available during my care of the patient were reviewed by me and considered in my medical decision making (see chart for details).    MDM Rules/Calculators/A&P                           This is 60 yo female with history as above presenting to ER with chest pain/ palpitations/ afib with RVR.  Symptoms have improved since arrival. Vital signs reviewed, hr remains elevated, BP is stable. Her mentation is appropriate. Serious etiology considered.   ECG with afib with RVR.  No troponin leak, symptoms ongoing >3 hours. No chest pain. CXR reviewed and is stable.   Labs reviewed otherwise stable  Given Lopressor, her rate is controlled around 90. CHADSVASC score is 1, she was previously on eliquis, will give her ASA. Advised to take this daily. Will not restart eliquis at this time and will defer to afib clinc/cards as they recently d/c's it. Advised her to resume increased lopressor dose going forward (25mg  from 12.5mg  qd), daily asa, and to f/u with cardiology tomorrow in office. She is agreeable. Will RTED if unable to see cardiology.  She is ambulatory, tolerating PO, asymptomatic.   The patient improved significantly and was discharged in stable condition. Detailed discussions were had with the patient regarding current findings, and need for close f/u with PCP or on call doctor. The patient has been instructed to return immediately if the symptoms worsen in any way for re-evaluation. Patient verbalized understanding and is in agreement with current care plan. All questions answered prior to discharge.   Final Clinical Impression(s) / ED Diagnoses Final diagnoses:  Paroxysmal atrial fibrillation with rapid ventricular response (HCC)    Rx / DC Orders ED Discharge Orders          Ordered    metoprolol succinate (TOPROL-XL) 25 MG 24 hr tablet  Daily        04/03/21 1559    aspirin 81 MG chewable tablet  Daily        04/03/21 1559             04/05/21, DO 04/03/21 2127

## 2021-04-03 NOTE — ED Notes (Signed)
Patient given discharge instructions, all questions answered. Patient in possession of all belongings, directed to the discharge area  

## 2021-04-03 NOTE — ED Notes (Signed)
X-ray at bedside

## 2021-04-04 ENCOUNTER — Other Ambulatory Visit (HOSPITAL_COMMUNITY): Payer: Self-pay

## 2021-04-04 ENCOUNTER — Telehealth (HOSPITAL_COMMUNITY): Payer: Self-pay

## 2021-04-04 MED ORDER — APIXABAN 5 MG PO TABS: 5.0000 mg | ORAL_TABLET | Freq: Two times a day (BID) | ORAL | 0 refills | Status: DC

## 2021-04-04 NOTE — Telephone Encounter (Signed)
Contacted patient and she states she is feeling real tired since she was seen at the ER over the weekend. On Saturday her blood pressure  was registering at 185/90, heart rate- 140 range. Her metoprolol medication was changed over the weekend by one of the ER doctors to 25mg  once daily. She seems to be doing better.   Consulted with and patient informed her tiredness is probably due to the increase in her Metoprolol medication and from the A-fib over the weekend. Blood pressure this morning was 101/75 and her heart rate 57. She will contact A-fib clinic back if she has any further concerns with her A-fib. Scheduled her appointment to be seen next week with Clint Fenton-PA on August 23rd @ 9:00am. Patient verbalized understanding.

## 2021-04-12 ENCOUNTER — Ambulatory Visit (HOSPITAL_COMMUNITY)
Admission: RE | Admit: 2021-04-12 | Discharge: 2021-04-12 | Disposition: A | Discharge status: Home / Self Care | Payer: BC Managed Care – PPO | Source: Ambulatory Visit | Attending: Physician Assistant | Admitting: Physician Assistant

## 2021-04-12 ENCOUNTER — Encounter (HOSPITAL_COMMUNITY): Payer: Self-pay | Admitting: Physician Assistant

## 2021-04-12 ENCOUNTER — Other Ambulatory Visit: Payer: Self-pay

## 2021-04-12 VITALS — BP 120/90 | HR 93 | Ht 66.75 in | Wt 184.4 lb

## 2021-04-12 DIAGNOSIS — I4819 Other persistent atrial fibrillation: Secondary | ICD-10-CM | POA: Insufficient documentation

## 2021-04-12 DIAGNOSIS — Z7901 Long term (current) use of anticoagulants: Secondary | ICD-10-CM | POA: Diagnosis not present

## 2021-04-12 MED ORDER — METOPROLOL SUCCINATE ER 25 MG PO TB24: ORAL_TABLET | ORAL | 1 refills | Status: DC

## 2021-04-12 MED ORDER — APIXABAN 5 MG PO TABS: 5.0000 mg | ORAL_TABLET | Freq: Two times a day (BID) | ORAL | 3 refills | Status: DC

## 2021-04-12 NOTE — Patient Instructions (Signed)
Stop aspirin  Start Eliquis 5mg  twice a day  Increase metoprolol to 1 tablet in the morning and 1/2 tablet in the evening.

## 2021-04-12 NOTE — Progress Notes (Signed)
Primary Care Physician: Clayborn Heron, MD Primary Cardiologist: none Primary Electrophysiologist: none Referring Physician: Dr Oneida Arenas is a 60 y.o. female with a history of new onset atrial fibrillation who presents for follow up in the [facility].  The patient was initially diagnosed with atrial fibrillation 01/13/21 incidentally at a routine follow up at PCP. ECG showed afib with RVR. Patient is on Eliquis for a CHADS2VASC score of 1. She was also started on metoprolol for rate control. She denies significant snore but does have a history of alcohol and caffeine use.   On follow up today, patient presented to the ED 04/03/21 with symptoms of elevated heart rate, nausea, and lightheadedness. She was rate controlled on a higher dose of BB and discharged. She remains in afib with symptoms of fatigue, chest fullness, and lightheadedness. Her heart rate at home ranges from 90s-130s on Kardia mobile device. There were no specific triggers that she could identify.   Today, she denies symptoms of shortness of breath, orthopnea, PND, lower extremity edema, presyncope, syncope, snoring, daytime somnolence, bleeding, or neurologic sequela. The patient is tolerating medications without difficulties and is otherwise without complaint today.    Atrial Fibrillation Risk Factors:  she does not have symptoms or diagnosis of sleep apnea. she does not have a history of rheumatic fever. she does have a history of alcohol use. The patient does not have a history of early familial atrial fibrillation or other arrhythmias.  she has a BMI of Body mass index is 29.1 kg/m.Marland Kitchen Filed Weights   04/12/21 0915  Weight: 83.6 kg      No family history on file.   Atrial Fibrillation Management history:  Previous antiarrhythmic drugs: none Previous cardioversions: none Previous ablations: none CHADS2VASC score: 1 Anticoagulation history: Eliquis   Past  Medical History:  Diagnosis Date   Paroxysmal atrial fibrillation (HCC)    No past surgical history on file.  Current Outpatient Medications  Medication Sig Dispense Refill   latanoprost (XALATAN) 0.005 % ophthalmic solution Place 1 drop into both eyes at bedtime.     apixaban (ELIQUIS) 5 MG TABS tablet Take 1 tablet (5 mg total) by mouth 2 (two) times daily. 60 tablet 3   metoprolol succinate (TOPROL-XL) 25 MG 24 hr tablet Take 1 tablet in the AM and 1/2 tablet in the PM 45 tablet 1   No current facility-administered medications for this encounter.    Allergies  Allergen Reactions   Elemental Sulfur     REACTION: hives   Sulfa Antibiotics Rash    Social History   Socioeconomic History   Marital status: Married    Spouse name: Not on file   Number of children: Not on file   Years of education: Not on file   Highest education level: Not on file  Occupational History   Not on file  Tobacco Use   Smoking status: Never   Smokeless tobacco: Never  Substance and Sexual Activity   Alcohol use: Not Currently    Alcohol/week: 2.0 standard drinks    Types: 2 Glasses of wine per week    Comment: 2 glasses nightly   Drug use: Never   Sexual activity: Not on file  Other Topics Concern   Not on file  Social History Narrative   Not on file   Social Determinants of Health   Financial Resource Strain: Not on file  Food Insecurity: Not on file  Transportation Needs: Not on  file  Physical Activity: Not on file  Stress: Not on file  Social Connections: Not on file  Intimate Partner Violence: Not on file     ROS- All systems are reviewed and negative except as per the HPI above.  Physical Exam: Vitals:   04/12/21 0915  BP: 120/90  Pulse: 93  Weight: 83.6 kg  Height: 5' 6.75" (1.695 m)    GEN- The patient is a well appearing female, alert and oriented x 3 today.   HEENT-head normocephalic, atraumatic, sclera clear, conjunctiva pink, hearing intact, trachea  midline. Lungs- Clear to ausculation bilaterally, normal work of breathing Heart- irregular rate and rhythm, no murmurs, rubs or gallops  GI- soft, NT, ND, + BS Extremities- no clubbing, cyanosis, or edema MS- no significant deformity or atrophy Skin- no rash or lesion Psych- euthymic mood, full affect Neuro- strength and sensation are intact   Wt Readings from Last 3 Encounters:  04/12/21 83.6 kg  03/03/21 84.7 kg  01/19/21 84.9 kg    EKG today demonstrates  Afib Vent. rate 93 BPM PR interval * ms QRS duration 72 ms QT/QTcB 378/469 ms  Echo 02/24/21 demonstrated   1. Left ventricular ejection fraction, by estimation, is 60 to 65%. The  left ventricle has normal function. The left ventricle has no regional  wall motion abnormalities. Left ventricular diastolic parameters were  normal.   2. Right ventricular systolic function is normal. The right ventricular  size is normal. Tricuspid regurgitation signal is inadequate for assessing  PA pressure.   3. The mitral valve is normal in structure. No evidence of mitral valve  regurgitation.   4. The aortic valve is tricuspid. Aortic valve regurgitation is trivial.  No aortic stenosis is present.   5. The inferior vena cava is normal in size with greater than 50%  respiratory variability, suggesting right atrial pressure of 3 mmHg.   Epic records are reviewed at length today  Labs from PCP 01/13/21 Cr 0.88, K+ 4.6, AST 21, ALT 19 WBC 5.9, Hgb 14.8, Hct 43.2 TSH 2.18  CHA2DS2-VASc Score = 1  The patient's score is based upon: CHF History: No HTN History: No Diabetes History: No Stroke History: No Vascular Disease History: No Age Score: 0 Gender Score: 1      ASSESSMENT AND PLAN: 1. Persistent atrial fibrillation  The patient's CHA2DS2-VASc score is 1, indicating a 0.6% annual risk of stroke. Patient now appears persistently in afib.  Will stop ASA and resume Eliquis 5 mg BID.  We discussed therapeutic options today  including DCCV, AAD (flecainide), and ablation. Offered TEE guided DCCV but patient opted to wait for 3 weeks of uninterrupted anticoagulation. At that time, will discuss starting flecainide for chemical conversion vs DCCV.  Increase Toprol to 25 mg AM and 12.5 mg PM for better rate control.   Follow up in the AF clinic in 3 weeks.    Jorja Loa PA-C Afib Clinic [facility] [address] [place], [place] [address] [phone] 04/12/2021 1:27 PM

## 2021-05-03 ENCOUNTER — Encounter (HOSPITAL_COMMUNITY): Payer: Self-pay | Admitting: Physician Assistant

## 2021-05-03 ENCOUNTER — Other Ambulatory Visit: Payer: Self-pay

## 2021-05-03 ENCOUNTER — Ambulatory Visit (HOSPITAL_COMMUNITY)
Admission: RE | Admit: 2021-05-03 | Discharge: 2021-05-03 | Disposition: A | Discharge status: Home / Self Care | Payer: BC Managed Care – PPO | Source: Ambulatory Visit | Attending: Physician Assistant | Admitting: Physician Assistant

## 2021-05-03 VITALS — BP 128/88 | HR 51 | Ht 66.75 in | Wt 190.6 lb

## 2021-05-03 DIAGNOSIS — I4819 Other persistent atrial fibrillation: Secondary | ICD-10-CM | POA: Insufficient documentation

## 2021-05-03 DIAGNOSIS — Z79899 Other long term (current) drug therapy: Secondary | ICD-10-CM | POA: Insufficient documentation

## 2021-05-03 DIAGNOSIS — Z7901 Long term (current) use of anticoagulants: Secondary | ICD-10-CM | POA: Insufficient documentation

## 2021-05-03 MED ORDER — FLECAINIDE ACETATE 50 MG PO TABS: 50.0000 mg | ORAL_TABLET | Freq: Two times a day (BID) | ORAL | 3 refills | Status: DC

## 2021-05-03 MED ORDER — METOPROLOL SUCCINATE ER 25 MG PO TB24: 25.0000 mg | ORAL_TABLET | Freq: Every day | ORAL | 3 refills | Status: DC

## 2021-05-03 MED ORDER — APIXABAN 5 MG PO TABS
5.0000 mg | ORAL_TABLET | Freq: Two times a day (BID) | ORAL | 3 refills | Status: DC
Start: 2021-05-03 — End: 2021-05-11

## 2021-05-03 MED ORDER — METOPROLOL SUCCINATE ER 25 MG PO TB24
25.0000 mg | ORAL_TABLET | Freq: Every day | ORAL | 1 refills | Status: DC
Start: 2021-05-03 — End: 2021-05-03

## 2021-05-03 NOTE — Progress Notes (Signed)
Primary Care Physician: Clayborn Heron, MD Primary Cardiologist: none Primary Electrophysiologist: none Referring Physician: Dr Terri Drake is a 60 y.o. female with a history of new onset atrial fibrillation who presents for follow up in the [facility].  The patient was initially diagnosed with atrial fibrillation 01/13/21 incidentally at a routine follow up at PCP. ECG showed afib with RVR. Patient is on Eliquis for a CHADS2VASC score of 1. She was also started on metoprolol for rate control. She denies significant snore but does have a history of alcohol and caffeine use. Patient presented to the ED 04/03/21 with symptoms of elevated heart rate, nausea, and lightheadedness. She was rate controlled on a higher dose of BB and discharged. There were no specific triggers that she could identify.   On follow up today, patient reports that she was back in SR the day after her last visit. However, she continues to have paroxysms of afib. She denies any bleeding issues on anticoagulation. She has noted bradycardia in the mornings on the higher dose of BB but is asymptomatic.   Today, she denies symptoms of chest pain, shortness of breath, orthopnea, PND, lower extremity edema, presyncope, syncope, snoring, daytime somnolence, bleeding, or neurologic sequela. The patient is tolerating medications without difficulties and is otherwise without complaint today.    Atrial Fibrillation Risk Factors:  she does not have symptoms or diagnosis of sleep apnea. she does not have a history of rheumatic fever. she does have a history of alcohol use. The patient does not have a history of early familial atrial fibrillation or other arrhythmias.  she has a BMI of Body mass index is 30.08 kg/m.Marland Kitchen Filed Weights   05/03/21 0843  Weight: 86.5 kg      No family history on file.   Atrial Fibrillation Management history:  Previous antiarrhythmic drugs:  none Previous cardioversions: none Previous ablations: none CHADS2VASC score: 1 Anticoagulation history: Eliquis   Past Medical History:  Diagnosis Date   Paroxysmal atrial fibrillation (HCC)    No past surgical history on file.  Current Outpatient Medications  Medication Sig Dispense Refill   flecainide (TAMBOCOR) 50 MG tablet Take 1 tablet (50 mg total) by mouth 2 (two) times daily. 60 tablet 3   latanoprost (XALATAN) 0.005 % ophthalmic solution Place 1 drop into both eyes at bedtime.     apixaban (ELIQUIS) 5 MG TABS tablet Take 1 tablet (5 mg total) by mouth 2 (two) times daily. 60 tablet 3   metoprolol succinate (TOPROL-XL) 25 MG 24 hr tablet Take 1 tablet (25 mg total) by mouth daily. 30 tablet 3   No current facility-administered medications for this encounter.    Allergies  Allergen Reactions   Elemental Sulfur     REACTION: hives   Sulfa Antibiotics Rash    Social History   Socioeconomic History   Marital status: Married    Spouse name: Not on file   Number of children: Not on file   Years of education: Not on file   Highest education level: Not on file  Occupational History   Not on file  Tobacco Use   Smoking status: Never   Smokeless tobacco: Never  Substance and Sexual Activity   Alcohol use: Yes    Alcohol/week: 2.0 standard drinks    Types: 2 Glasses of wine per week    Comment: 2 glasses nightly   Drug use: Never   Sexual activity: Not on file  Other  Topics Concern   Not on file  Social History Narrative   Not on file   Social Determinants of Health   Financial Resource Strain: Not on file  Food Insecurity: Not on file  Transportation Needs: Not on file  Physical Activity: Not on file  Stress: Not on file  Social Connections: Not on file  Intimate Partner Violence: Not on file     ROS- All systems are reviewed and negative except as per the HPI above.  Physical Exam: Vitals:   05/03/21 0843  BP: 128/88  Pulse: (!) 51  Weight:  86.5 kg  Height: 5' 6.75" (1.695 m)    GEN- The patient is a well appearing obese female, alert and oriented x 3 today.   HEENT-head normocephalic, atraumatic, sclera clear, conjunctiva pink, hearing intact, trachea midline. Lungs- Clear to ausculation bilaterally, normal work of breathing Heart- Regular rate and rhythm, bradycardia, no murmurs, rubs or gallops  GI- soft, NT, ND, + BS Extremities- no clubbing, cyanosis, or edema MS- no significant deformity or atrophy Skin- no rash or lesion Psych- euthymic mood, full affect Neuro- strength and sensation are intact   Wt Readings from Last 3 Encounters:  05/03/21 86.5 kg  04/12/21 83.6 kg  03/03/21 84.7 kg    EKG today demonstrates  SB Vent. rate 51 BPM PR interval 168 ms QRS duration 74 ms QT/QTcB 498/458 ms  Echo 02/24/21 demonstrated   1. Left ventricular ejection fraction, by estimation, is 60 to 65%. The  left ventricle has normal function. The left ventricle has no regional  wall motion abnormalities. Left ventricular diastolic parameters were  normal.   2. Right ventricular systolic function is normal. The right ventricular  size is normal. Tricuspid regurgitation signal is inadequate for assessing  PA pressure.   3. The mitral valve is normal in structure. No evidence of mitral valve  regurgitation.   4. The aortic valve is tricuspid. Aortic valve regurgitation is trivial.  No aortic stenosis is present.   5. The inferior vena cava is normal in size with greater than 50%  respiratory variability, suggesting right atrial pressure of 3 mmHg.   Epic records are reviewed at length today  Labs from PCP 01/13/21 Cr 0.88, K+ 4.6, AST 21, ALT 19 WBC 5.9, Hgb 14.8, Hct 43.2 TSH 2.18  CHA2DS2-VASc Score = 1  The patient's score is based upon: CHF History: 0 HTN History: 0 Diabetes History: 0 Stroke History: 0 Vascular Disease History: 0 Age Score: 0 Gender Score: 1      ASSESSMENT AND PLAN: 1. Persistent  atrial fibrillation  The patient's CHA2DS2-VASc score is 1, indicating a 0.6% annual risk of stroke. Patient back in SR but still having frequent episodes.  Will start flecainide 50 mg BID.  Continue Eliquis 5 mg BID for now.  Decrease Toprol to 25 mg daily.   Follow up later this week for ECG after starting flecainide.    Jorja Loa PA-C [facility] [address] [place], [place] [address] [phone] 05/03/2021 10:55 AM

## 2021-05-03 NOTE — Patient Instructions (Signed)
Start flecainide 50mg  twice a day  Decrease metoprolol to 25mg  once a day

## 2021-05-06 ENCOUNTER — Other Ambulatory Visit: Payer: Self-pay

## 2021-05-06 ENCOUNTER — Ambulatory Visit (HOSPITAL_COMMUNITY)
Admission: RE | Admit: 2021-05-06 | Discharge: 2021-05-06 | Disposition: A | Discharge status: Home / Self Care | Payer: BC Managed Care – PPO | Source: Ambulatory Visit | Attending: Physician Assistant | Admitting: Physician Assistant

## 2021-05-06 DIAGNOSIS — Z7901 Long term (current) use of anticoagulants: Secondary | ICD-10-CM | POA: Insufficient documentation

## 2021-05-06 DIAGNOSIS — I4819 Other persistent atrial fibrillation: Secondary | ICD-10-CM | POA: Diagnosis not present

## 2021-05-06 MED ORDER — METOPROLOL SUCCINATE ER 25 MG PO TB24: 12.5000 mg | ORAL_TABLET | Freq: Every day | ORAL | 3 refills | Status: DC

## 2021-05-06 NOTE — Progress Notes (Signed)
Patient returns for ECG after starting flecainide. ECG shows SB HR 47, PR 164, QRS 74, QTc 447. Will decrease Toprol to 12.5 mg daily given bradycardia. Patient to continue Eliquis until 05/27/21, then discontinue with low CV score. F/u in the AF clinic in 3 months.

## 2021-05-06 NOTE — Patient Instructions (Signed)
Decrease metoprolol to 1/2 tablet once a day (12.5mg )  After current Eliquis finished can stop

## 2021-05-11 ENCOUNTER — Other Ambulatory Visit (HOSPITAL_COMMUNITY): Payer: Self-pay

## 2021-05-11 MED ORDER — APIXABAN 5 MG PO TABS: 5.0000 mg | ORAL_TABLET | Freq: Two times a day (BID) | ORAL | 0 refills | Status: DC

## 2021-08-01 ENCOUNTER — Other Ambulatory Visit (HOSPITAL_COMMUNITY): Payer: Self-pay | Admitting: Physician Assistant

## 2021-08-05 ENCOUNTER — Other Ambulatory Visit: Payer: Self-pay

## 2021-08-05 ENCOUNTER — Ambulatory Visit (HOSPITAL_COMMUNITY)
Admission: RE | Admit: 2021-08-05 | Discharge: 2021-08-05 | Disposition: A | Discharge status: Home / Self Care | Payer: BC Managed Care – PPO | Source: Ambulatory Visit | Attending: Physician Assistant | Admitting: Physician Assistant

## 2021-08-05 ENCOUNTER — Encounter (HOSPITAL_COMMUNITY): Payer: Self-pay | Admitting: Physician Assistant

## 2021-08-05 VITALS — BP 132/88 | HR 57 | Ht 66.75 in | Wt 190.0 lb

## 2021-08-05 DIAGNOSIS — F1090 Alcohol use, unspecified, uncomplicated: Secondary | ICD-10-CM | POA: Diagnosis not present

## 2021-08-05 DIAGNOSIS — Z79899 Other long term (current) drug therapy: Secondary | ICD-10-CM | POA: Insufficient documentation

## 2021-08-05 DIAGNOSIS — I4819 Other persistent atrial fibrillation: Secondary | ICD-10-CM | POA: Insufficient documentation

## 2021-08-05 DIAGNOSIS — R001 Bradycardia, unspecified: Secondary | ICD-10-CM | POA: Diagnosis not present

## 2021-08-05 MED ORDER — FLECAINIDE ACETATE 50 MG PO TABS: 50.0000 mg | ORAL_TABLET | Freq: Two times a day (BID) | ORAL | 1 refills | Status: DC

## 2021-08-05 MED ORDER — DILTIAZEM HCL ER COATED BEADS 120 MG PO CP24: 120.0000 mg | ORAL_CAPSULE | Freq: Every day | ORAL | 1 refills | Status: DC

## 2021-08-05 NOTE — Patient Instructions (Signed)
Stop metoprolol  Start Cardizem (Diltiazem) 120mg  once a day

## 2021-08-05 NOTE — Progress Notes (Addendum)
Primary Care Physician: Clayborn Heron, MD Primary Cardiologist: none Primary Electrophysiologist: none Referring Physician: Dr Oneida Arenas is a 60 y.o. female with a history of new onset atrial fibrillation who presents for follow up in the [facility].  The patient was initially diagnosed with atrial fibrillation 01/13/21 incidentally at a routine follow up at PCP. ECG showed afib with RVR. Patient is on Eliquis for a CHADS2VASC score of 1. She was also started on metoprolol for rate control. She denies significant snore but does have a history of alcohol and caffeine use. Patient presented to the ED 04/03/21 with symptoms of elevated heart rate, nausea, and lightheadedness. She was rate controlled on a higher dose of BB and discharged. There were no specific triggers that she could identify.   On follow up today, patient reports that she has done well from a cardiac standpoint. She has not had any heart racing or palpitations since starting flecainide. However, she has noticed fatigue and weight gain on the BB.   Today, she denies symptoms of palpitations, chest pain, shortness of breath, orthopnea, PND, lower extremity edema, presyncope, syncope, snoring, daytime somnolence, bleeding, or neurologic sequela. The patient is tolerating medications without difficulties and is otherwise without complaint today.    Atrial Fibrillation Risk Factors:  she does not have symptoms or diagnosis of sleep apnea. she does not have a history of rheumatic fever. she does have a history of alcohol use. The patient does not have a history of early familial atrial fibrillation or other arrhythmias.  she has a BMI of Body mass index is 29.98 kg/m.Marland Kitchen Filed Weights   08/05/21 0828  Weight: 86.2 kg       No family history on file.   Atrial Fibrillation Management history:  Previous antiarrhythmic drugs: flecainide  Previous cardioversions:  none Previous ablations: none CHADS2VASC score: 1 Anticoagulation history: Eliquis   Past Medical History:  Diagnosis Date   Paroxysmal atrial fibrillation (HCC)    No past surgical history on file.  Current Outpatient Medications  Medication Sig Dispense Refill   diltiazem (CARDIZEM CD) 120 MG 24 hr capsule Take 1 capsule (120 mg total) by mouth daily. 30 capsule 1   latanoprost (XALATAN) 0.005 % ophthalmic solution Place 1 drop into both eyes at bedtime.     flecainide (TAMBOCOR) 50 MG tablet Take 1 tablet (50 mg total) by mouth 2 (two) times daily. 180 tablet 1   No current facility-administered medications for this encounter.    Allergies  Allergen Reactions   Elemental Sulfur     REACTION: hives   Sulfa Antibiotics Rash    Social History   Socioeconomic History   Marital status: Married    Spouse name: Not on file   Number of children: Not on file   Years of education: Not on file   Highest education level: Not on file  Occupational History   Not on file  Tobacco Use   Smoking status: Never   Smokeless tobacco: Never  Substance and Sexual Activity   Alcohol use: Yes    Alcohol/week: 5.0 - 10.0 standard drinks    Types: 5 - 10 Glasses of wine per week    Comment: 2 glasses nightly 08/05/2021   Drug use: Never   Sexual activity: Not on file  Other Topics Concern   Not on file  Social History Narrative   Not on file   Social Determinants of Health   Financial Resource  Strain: Not on file  Food Insecurity: Not on file  Transportation Needs: Not on file  Physical Activity: Not on file  Stress: Not on file  Social Connections: Not on file  Intimate Partner Violence: Not on file     ROS- All systems are reviewed and negative except as per the HPI above.  Physical Exam: Vitals:   08/05/21 0828  BP: 132/88  Pulse: (!) 57  Weight: 86.2 kg  Height: 5' 6.75" (1.695 m)   GEN- The patient is a well appearing female, alert and oriented x 3 today.    HEENT-head normocephalic, atraumatic, sclera clear, conjunctiva pink, hearing intact, trachea midline. Lungs- Clear to ausculation bilaterally, normal work of breathing Heart- Regular rate and rhythm, no murmurs, rubs or gallops  GI- soft, NT, ND, + BS Extremities- no clubbing, cyanosis, or edema MS- no significant deformity or atrophy Skin- no rash or lesion Psych- euthymic mood, full affect Neuro- strength and sensation are intact   Wt Readings from Last 3 Encounters:  08/05/21 86.2 kg  05/03/21 86.5 kg  04/12/21 83.6 kg    EKG today demonstrates  SB Vent. rate 57 BPM PR interval 192 ms QRS duration 80 ms QT/QTcB 482/469 ms  Echo 02/24/21 demonstrated   1. Left ventricular ejection fraction, by estimation, is 60 to 65%. The  left ventricle has normal function. The left ventricle has no regional  wall motion abnormalities. Left ventricular diastolic parameters were  normal.   2. Right ventricular systolic function is normal. The right ventricular  size is normal. Tricuspid regurgitation signal is inadequate for assessing PA pressure.   3. The mitral valve is normal in structure. No evidence of mitral valve  regurgitation.   4. The aortic valve is tricuspid. Aortic valve regurgitation is trivial.  No aortic stenosis is present.   5. The inferior vena cava is normal in size with greater than 50%  respiratory variability, suggesting right atrial pressure of 3 mmHg.   Epic records are reviewed at length today  Labs from PCP 01/13/21 Cr 0.88, K+ 4.6, AST 21, ALT 19 WBC 5.9, Hgb 14.8, Hct 43.2 TSH 2.18  CHA2DS2-VASc Score = 1  The patient's score is based upon: CHF History: 0 HTN History: 0 Diabetes History: 0 Stroke History: 0 Vascular Disease History: 0 Age Score: 0 Gender Score: 1      ASSESSMENT AND PLAN: 1. Persistent atrial fibrillation  The patient's CHA2DS2-VASc score is 1, indicating a 0.6% annual risk of stroke. Patient appears to be maintaining SR on  present dose of flecainide.  Will arrange for treadmill stress test.  Continue flecainide 50 mg BID Will change Toprol to diltiazem 120 mg daily given side effects.  Anticoagulation not indicated at this time with low CV score.    Follow up in the AF clinic in 3 months.    [facility] [address] [place], [place] [address] [phone] 08/05/2021 10:02 AM

## 2021-08-09 ENCOUNTER — Telehealth (HOSPITAL_COMMUNITY): Payer: Self-pay | Admitting: *Deleted

## 2021-08-09 NOTE — Telephone Encounter (Signed)
Close encounter 

## 2021-08-11 ENCOUNTER — Ambulatory Visit (HOSPITAL_COMMUNITY)
Admission: RE | Admit: 2021-08-11 | Discharge: 2021-08-11 | Disposition: A | Discharge status: Home / Self Care | Payer: BC Managed Care – PPO | Source: Ambulatory Visit | Attending: Cardiovascular Disease | Admitting: Cardiovascular Disease

## 2021-08-11 ENCOUNTER — Other Ambulatory Visit: Payer: Self-pay

## 2021-08-11 DIAGNOSIS — I4819 Other persistent atrial fibrillation: Secondary | ICD-10-CM

## 2021-08-12 LAB — EXERCISE TOLERANCE TEST
Angina Index: 0
Duke Treadmill Score: 8
Estimated workload: 10.1
Exercise duration (min): 8 min
Exercise duration (sec): 0 s
MPHR: 159 {beats}/min
Peak HR: 146 {beats}/min
Percent HR: 91 %
Rest HR: 59 {beats}/min
ST Depression (mm): 0 mm

## 2021-08-18 ENCOUNTER — Other Ambulatory Visit (HOSPITAL_COMMUNITY): Payer: Self-pay | Admitting: Physician Assistant

## 2021-08-19 ENCOUNTER — Telehealth (HOSPITAL_COMMUNITY): Payer: Self-pay | Admitting: *Deleted

## 2021-08-19 MED ORDER — METOPROLOL SUCCINATE ER 25 MG PO TB24: 12.5000 mg | ORAL_TABLET | Freq: Every day | ORAL | 3 refills | Status: DC

## 2021-08-19 NOTE — Telephone Encounter (Signed)
Patient decided she would prefer to stay on metoprolol rather than change to cardizem after reading possible side effects of cardizem Rx updated for patient per her request.

## 2021-11-03 NOTE — Progress Notes (Signed)
? ? ?Primary Care Physician: Clayborn Heron, MD ?Primary Cardiologist: none ?Primary Electrophysiologist: none ?Referring Physician: Dr Barbaraann Barthel ? ? ?Terri Drake is a 61 y.o. female with a history of new onset atrial fibrillation who presents for follow up in the [facility].  The patient was initially diagnosed with atrial fibrillation 01/13/21 incidentally at a routine follow up at PCP. ECG showed afib with RVR. Patient is on Eliquis for a CHADS2VASC score of 1. She was also started on metoprolol for rate control. She denies significant snore but does have a history of alcohol and caffeine use. Patient presented to the ED 04/03/21 with symptoms of elevated heart rate, nausea, and lightheadedness. She was rate controlled on a higher dose of BB and discharged. There were no specific triggers that she could identify.  ? ?On follow up today, patient reports that she has done well since her last visit. She denies any heart racing or palpitations.  ? ?Today, she denies symptoms of palpitations, chest pain, shortness of breath, orthopnea, PND, lower extremity edema, presyncope, syncope, snoring, daytime somnolence, bleeding, or neurologic sequela. The patient is tolerating medications without difficulties and is otherwise without complaint today.  ? ? ?Atrial Fibrillation Risk Factors: ? ?she does not have symptoms or diagnosis of sleep apnea. ?she does not have a history of rheumatic fever. ?she does have a history of alcohol use. ?The patient does not have a history of early familial atrial fibrillation or other arrhythmias. ? ?she has a BMI of Body mass index is 30.71 kg/m?Marland KitchenMarland Kitchen ?Filed Weights  ? 11/04/21 0831  ?Weight: 88.3 kg  ? ? ? ?No family history on file. ? ? ?Atrial Fibrillation Management history: ? ?Previous antiarrhythmic drugs: flecainide  ?Previous cardioversions: none ?Previous ablations: none ?CHADS2VASC score: 1 ?Anticoagulation history: Eliquis ? ? ?Past Medical  History:  ?Diagnosis Date  ? Paroxysmal atrial fibrillation (HCC)   ? ?No past surgical history on file. ? ?Current Outpatient Medications  ?Medication Sig Dispense Refill  ? flecainide (TAMBOCOR) 50 MG tablet TAKE 1 TABLET(50 MG) BY MOUTH TWICE DAILY 60 tablet 3  ? fluticasone (CUTIVATE) 0.05 % cream Apply topically 2 (two) times daily as needed.    ? latanoprost (XALATAN) 0.005 % ophthalmic solution Place 1 drop into both eyes at bedtime.    ? metoprolol succinate (TOPROL XL) 25 MG 24 hr tablet Take 0.5 tablets (12.5 mg total) by mouth daily. 30 tablet 3  ? ?No current facility-administered medications for this encounter.  ? ? ?Allergies  ?Allergen Reactions  ? Elemental Sulfur   ?  REACTION: hives  ? Sulfa Antibiotics Rash  ? ? ?Social History  ? ?Socioeconomic History  ? Marital status: Married  ?  Spouse name: Not on file  ? Number of children: Not on file  ? Years of education: Not on file  ? Highest education level: Not on file  ?Occupational History  ? Not on file  ?Tobacco Use  ? Smoking status: Never  ? Smokeless tobacco: Never  ? Tobacco comments:  ?  Never smoke 11/04/21  ?Substance and Sexual Activity  ? Alcohol use: Yes  ?  Alcohol/week: 5.0 - 10.0 standard drinks  ?  Types: 5 - 10 Glasses of wine per week  ?  Comment: 1-2 glasses nightly 08/05/2021  ? Drug use: Never  ? Sexual activity: Not on file  ?Other Topics Concern  ? Not on file  ?Social History Narrative  ? Not on file  ? ?Social Determinants  of Health  ? ?Financial Resource Strain: Not on file  ?Food Insecurity: Not on file  ?Transportation Needs: Not on file  ?Physical Activity: Not on file  ?Stress: Not on file  ?Social Connections: Not on file  ?Intimate Partner Violence: Not on file  ? ? ? ?ROS- All systems are reviewed and negative except as per the HPI above. ? ?Physical Exam: ?Vitals:  ? 11/04/21 0831  ?BP: 124/80  ?Pulse: 61  ?Weight: 88.3 kg  ?Height: 5' 6.75" (1.695 m)  ? ? ?GEN- The patient is a well appearing obese female, alert  and oriented x 3 today.   ?HEENT-head normocephalic, atraumatic, sclera clear, conjunctiva pink, hearing intact, trachea midline. ?Lungs- Clear to ausculation bilaterally, normal work of breathing ?Heart- Regular rate and rhythm, no murmurs, rubs or gallops  ?GI- soft, NT, ND, + BS ?Extremities- no clubbing, cyanosis, or edema ?MS- no significant deformity or atrophy ?Skin- no rash or lesion ?Psych- euthymic mood, full affect ?Neuro- strength and sensation are intact ? ? ?Wt Readings from Last 3 Encounters:  ?11/04/21 88.3 kg  ?08/05/21 86.2 kg  ?05/03/21 86.5 kg  ? ? ?EKG today demonstrates  ?SR, PACs ?Vent. rate 61 BPM ?PR interval 176 ms ?QRS duration 76 ms ?QT/QTcB 474/477 ms ? ?Echo 02/24/21 demonstrated  ? 1. Left ventricular ejection fraction, by estimation, is 60 to 65%. The  ?left ventricle has normal function. The left ventricle has no regional  ?wall motion abnormalities. Left ventricular diastolic parameters were  ?normal.  ? 2. Right ventricular systolic function is normal. The right ventricular  ?size is normal. Tricuspid regurgitation signal is inadequate for assessing PA pressure.  ? 3. The mitral valve is normal in structure. No evidence of mitral valve  ?regurgitation.  ? 4. The aortic valve is tricuspid. Aortic valve regurgitation is trivial.  ?No aortic stenosis is present.  ? 5. The inferior vena cava is normal in size with greater than 50%  ?respiratory variability, suggesting right atrial pressure of 3 mmHg.  ? ?Epic records are reviewed at length today ? ? ?CHA2DS2-VASc Score = 1  ?The patient's score is based upon: ?CHF History: 0 ?HTN History: 0 ?Diabetes History: 0 ?Stroke History: 0 ?Vascular Disease History: 0 ?Age Score: 0 ?Gender Score: 1 ? ?   ? ?ASSESSMENT AND PLAN: ?1. Persistent atrial fibrillation  ?The patient's CHA2DS2-VASc score is 1, indicating a 0.6% annual risk of stroke. ?Patient maintaining SR. 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 If she has return of  her arrhythmia, would restart flecainide vs using PIP vs consider ablation.  ?Continue flecainide 50 mg BID for now ?Continue Toprol 12.5 mg daily ?Anticoagulation not indicated at this time with low CV score.  ? ? ?Follow up in the AF clinic in 3 months.  ? ? ?Ricky Aidin Doane PA-C ?Afib Clinic ?[facility] ?[address] ?[place], [place] [address] ?[phone] ?11/04/2021 ?8:45 AM ?

## 2021-11-04 ENCOUNTER — Encounter (HOSPITAL_COMMUNITY): Payer: Self-pay | Admitting: Physician Assistant

## 2021-11-04 ENCOUNTER — Ambulatory Visit (HOSPITAL_COMMUNITY)
Admission: RE | Admit: 2021-11-04 | Discharge: 2021-11-04 | Disposition: A | Discharge status: Home / Self Care | Payer: BC Managed Care – PPO | Source: Ambulatory Visit | Attending: Physician Assistant | Admitting: Physician Assistant

## 2021-11-04 ENCOUNTER — Other Ambulatory Visit: Payer: Self-pay

## 2021-11-04 VITALS — BP 124/80 | HR 61 | Ht 66.75 in | Wt 194.6 lb

## 2021-11-04 DIAGNOSIS — E669 Obesity, unspecified: Secondary | ICD-10-CM | POA: Insufficient documentation

## 2021-11-04 DIAGNOSIS — Z6830 Body mass index (BMI) 30.0-30.9, adult: Secondary | ICD-10-CM | POA: Diagnosis not present

## 2021-11-04 DIAGNOSIS — I491 Atrial premature depolarization: Secondary | ICD-10-CM | POA: Diagnosis not present

## 2021-11-04 DIAGNOSIS — I4819 Other persistent atrial fibrillation: Secondary | ICD-10-CM | POA: Insufficient documentation

## 2021-11-04 DIAGNOSIS — Z79899 Other long term (current) drug therapy: Secondary | ICD-10-CM | POA: Diagnosis not present

## 2021-11-04 DIAGNOSIS — F1090 Alcohol use, unspecified, uncomplicated: Secondary | ICD-10-CM | POA: Diagnosis not present

## 2021-11-04 MED ORDER — FLECAINIDE ACETATE 50 MG PO TABS: ORAL_TABLET | ORAL | 3 refills | Status: DC

## 2021-11-04 MED ORDER — METOPROLOL SUCCINATE ER 25 MG PO TB24: 12.5000 mg | ORAL_TABLET | Freq: Every day | ORAL | 3 refills | Status: DC

## 2021-12-27 ENCOUNTER — Inpatient Hospital Stay (HOSPITAL_COMMUNITY)
Admission: EM | Admit: 2021-12-27 | Discharge: 2022-01-04 | DRG: 958 | Disposition: A | Discharge status: Rehabilitation Facility | Payer: No Typology Code available for payment source | Attending: General Surgery | Admitting: General Surgery

## 2021-12-27 ENCOUNTER — Inpatient Hospital Stay (HOSPITAL_COMMUNITY): Payer: No Typology Code available for payment source

## 2021-12-27 ENCOUNTER — Emergency Department (HOSPITAL_COMMUNITY): Payer: No Typology Code available for payment source

## 2021-12-27 DIAGNOSIS — S22078A Other fracture of T9-T10 vertebra, initial encounter for closed fracture: Secondary | ICD-10-CM | POA: Diagnosis present

## 2021-12-27 DIAGNOSIS — S82851S Displaced trimalleolar fracture of right lower leg, sequela: Secondary | ICD-10-CM | POA: Diagnosis not present

## 2021-12-27 DIAGNOSIS — E559 Vitamin D deficiency, unspecified: Secondary | ICD-10-CM | POA: Diagnosis present

## 2021-12-27 DIAGNOSIS — Z79899 Other long term (current) drug therapy: Secondary | ICD-10-CM

## 2021-12-27 DIAGNOSIS — A499 Bacterial infection, unspecified: Secondary | ICD-10-CM | POA: Diagnosis not present

## 2021-12-27 DIAGNOSIS — S82851A Displaced trimalleolar fracture of right lower leg, initial encounter for closed fracture: Secondary | ICD-10-CM | POA: Diagnosis present

## 2021-12-27 DIAGNOSIS — N390 Urinary tract infection, site not specified: Secondary | ICD-10-CM | POA: Diagnosis not present

## 2021-12-27 DIAGNOSIS — I480 Paroxysmal atrial fibrillation: Secondary | ICD-10-CM | POA: Diagnosis not present

## 2021-12-27 DIAGNOSIS — S42112D Displaced fracture of body of scapula, left shoulder, subsequent encounter for fracture with routine healing: Secondary | ICD-10-CM | POA: Diagnosis not present

## 2021-12-27 DIAGNOSIS — S42109A Fracture of unspecified part of scapula, unspecified shoulder, initial encounter for closed fracture: Secondary | ICD-10-CM | POA: Diagnosis present

## 2021-12-27 DIAGNOSIS — S40212A Abrasion of left shoulder, initial encounter: Secondary | ICD-10-CM | POA: Diagnosis present

## 2021-12-27 DIAGNOSIS — S93431A Sprain of tibiofibular ligament of right ankle, initial encounter: Secondary | ICD-10-CM | POA: Diagnosis present

## 2021-12-27 DIAGNOSIS — R339 Retention of urine, unspecified: Secondary | ICD-10-CM | POA: Diagnosis not present

## 2021-12-27 DIAGNOSIS — S2242XA Multiple fractures of ribs, left side, initial encounter for closed fracture: Secondary | ICD-10-CM | POA: Diagnosis present

## 2021-12-27 DIAGNOSIS — S9304XA Dislocation of right ankle joint, initial encounter: Secondary | ICD-10-CM | POA: Diagnosis present

## 2021-12-27 DIAGNOSIS — S42102A Fracture of unspecified part of scapula, left shoulder, initial encounter for closed fracture: Principal | ICD-10-CM

## 2021-12-27 DIAGNOSIS — W208XXA Other cause of strike by thrown, projected or falling object, initial encounter: Secondary | ICD-10-CM | POA: Diagnosis present

## 2021-12-27 DIAGNOSIS — I4819 Other persistent atrial fibrillation: Secondary | ICD-10-CM | POA: Diagnosis present

## 2021-12-27 DIAGNOSIS — S5402XA Injury of ulnar nerve at forearm level, left arm, initial encounter: Secondary | ICD-10-CM | POA: Diagnosis present

## 2021-12-27 DIAGNOSIS — I10 Essential (primary) hypertension: Secondary | ICD-10-CM | POA: Diagnosis present

## 2021-12-27 DIAGNOSIS — Z91018 Allergy to other foods: Secondary | ICD-10-CM | POA: Diagnosis not present

## 2021-12-27 DIAGNOSIS — M898X9 Other specified disorders of bone, unspecified site: Secondary | ICD-10-CM | POA: Diagnosis present

## 2021-12-27 DIAGNOSIS — G8918 Other acute postprocedural pain: Secondary | ICD-10-CM | POA: Diagnosis not present

## 2021-12-27 DIAGNOSIS — S46002S Unspecified injury of muscle(s) and tendon(s) of the rotator cuff of left shoulder, sequela: Secondary | ICD-10-CM | POA: Diagnosis not present

## 2021-12-27 DIAGNOSIS — E876 Hypokalemia: Secondary | ICD-10-CM | POA: Diagnosis not present

## 2021-12-27 DIAGNOSIS — R7401 Elevation of levels of liver transaminase levels: Secondary | ICD-10-CM | POA: Diagnosis not present

## 2021-12-27 DIAGNOSIS — Z23 Encounter for immunization: Secondary | ICD-10-CM | POA: Diagnosis not present

## 2021-12-27 DIAGNOSIS — Y92214 College as the place of occurrence of the external cause: Secondary | ICD-10-CM

## 2021-12-27 DIAGNOSIS — S2242XD Multiple fractures of ribs, left side, subsequent encounter for fracture with routine healing: Secondary | ICD-10-CM | POA: Diagnosis not present

## 2021-12-27 DIAGNOSIS — Z20822 Contact with and (suspected) exposure to covid-19: Secondary | ICD-10-CM | POA: Diagnosis present

## 2021-12-27 DIAGNOSIS — S93431D Sprain of tibiofibular ligament of right ankle, subsequent encounter: Secondary | ICD-10-CM | POA: Diagnosis not present

## 2021-12-27 DIAGNOSIS — K5900 Constipation, unspecified: Secondary | ICD-10-CM | POA: Diagnosis not present

## 2021-12-27 DIAGNOSIS — Z882 Allergy status to sulfonamides status: Secondary | ICD-10-CM

## 2021-12-27 DIAGNOSIS — S42112A Displaced fracture of body of scapula, left shoulder, initial encounter for closed fracture: Secondary | ICD-10-CM | POA: Diagnosis present

## 2021-12-27 DIAGNOSIS — Y990 Civilian activity done for income or pay: Secondary | ICD-10-CM

## 2021-12-27 DIAGNOSIS — I959 Hypotension, unspecified: Secondary | ICD-10-CM | POA: Diagnosis not present

## 2021-12-27 DIAGNOSIS — I4891 Unspecified atrial fibrillation: Secondary | ICD-10-CM | POA: Diagnosis not present

## 2021-12-27 DIAGNOSIS — S82851D Displaced trimalleolar fracture of right lower leg, subsequent encounter for closed fracture with routine healing: Secondary | ICD-10-CM | POA: Diagnosis not present

## 2021-12-27 DIAGNOSIS — D62 Acute posthemorrhagic anemia: Secondary | ICD-10-CM | POA: Diagnosis not present

## 2021-12-27 DIAGNOSIS — R001 Bradycardia, unspecified: Secondary | ICD-10-CM | POA: Diagnosis not present

## 2021-12-27 DIAGNOSIS — S27321A Contusion of lung, unilateral, initial encounter: Secondary | ICD-10-CM | POA: Diagnosis not present

## 2021-12-27 DIAGNOSIS — M7989 Other specified soft tissue disorders: Secondary | ICD-10-CM | POA: Diagnosis not present

## 2021-12-27 DIAGNOSIS — T148XXA Other injury of unspecified body region, initial encounter: Secondary | ICD-10-CM | POA: Diagnosis not present

## 2021-12-27 DIAGNOSIS — I951 Orthostatic hypotension: Secondary | ICD-10-CM | POA: Diagnosis not present

## 2021-12-27 HISTORY — DX: Cardiac arrhythmia, unspecified: I49.9

## 2021-12-27 LAB — COMPREHENSIVE METABOLIC PANEL
ALT: 25 U/L (ref 0–44)
AST: 32 U/L (ref 15–41)
Albumin: 3.9 g/dL (ref 3.5–5.0)
Alkaline Phosphatase: 62 U/L (ref 38–126)
Anion gap: 9 (ref 5–15)
BUN: 8 mg/dL (ref 8–23)
CO2: 23 mmol/L (ref 22–32)
Calcium: 9.1 mg/dL (ref 8.9–10.3)
Chloride: 108 mmol/L (ref 98–111)
Creatinine, Ser: 0.85 mg/dL (ref 0.44–1.00)
GFR, Estimated: >60 mL/min (ref >60)
Glucose, Bld: 106 mg/dL — ABNORMAL HIGH (ref 70–99)
Potassium: 4.4 mmol/L (ref 3.5–5.1)
Sodium: 140 mmol/L (ref 135–145)
Total Bilirubin: 0.6 mg/dL (ref 0.3–1.2)
Total Protein: 6.2 g/dL — ABNORMAL LOW (ref 6.5–8.1)

## 2021-12-27 LAB — PROTIME-INR
INR: 1 (ref 0.8–1.2)
Prothrombin Time: 13.1 seconds (ref 11.4–15.2)

## 2021-12-27 LAB — I-STAT CHEM 8, ED
BUN: 10 mg/dL (ref 8–23)
Calcium, Ion: 1.11 mmol/L — ABNORMAL LOW (ref 1.15–1.40)
Chloride: 106 mmol/L (ref 98–111)
Creatinine, Ser: 0.8 mg/dL (ref 0.44–1.00)
Glucose, Bld: 106 mg/dL — ABNORMAL HIGH (ref 70–99)
HCT: 42 % (ref 36.0–46.0)
Hemoglobin: 14.3 g/dL (ref 12.0–15.0)
Potassium: 4.3 mmol/L (ref 3.5–5.1)
Sodium: 139 mmol/L (ref 135–145)
TCO2: 23 mmol/L (ref 22–32)

## 2021-12-27 LAB — RESP PANEL BY RT-PCR (FLU A&B, COVID) ARPGX2
Influenza A by PCR: NEGATIVE
Influenza B by PCR: NEGATIVE
SARS Coronavirus 2 by RT PCR: NEGATIVE

## 2021-12-27 LAB — CBC
HCT: 40.6 % (ref 36.0–46.0)
Hemoglobin: 14.3 g/dL (ref 12.0–15.0)
MCH: 35 pg — ABNORMAL HIGH (ref 26.0–34.0)
MCHC: 35.2 g/dL (ref 30.0–36.0)
MCV: 99.3 fL (ref 80.0–100.0)
Platelets: 194 10*3/uL (ref 150–400)
RBC: 4.09 MIL/uL (ref 3.87–5.11)
RDW: 12.8 % (ref 11.5–15.5)
WBC: 9.1 10*3/uL (ref 4.0–10.5)
nRBC: 0 % (ref 0.0–0.2)

## 2021-12-27 LAB — SAMPLE TO BLOOD BANK

## 2021-12-27 LAB — ETHANOL: Alcohol, Ethyl (B): <10 mg/dL (ref <10)

## 2021-12-27 LAB — LACTIC ACID, PLASMA: Lactic Acid, Venous: 1.7 mmol/L (ref 0.5–1.9)

## 2021-12-27 MED ORDER — OXYCODONE HCL 5 MG PO TABS: 5.0000 mg | ORAL_TABLET | ORAL | Status: DC | PRN

## 2021-12-27 MED ORDER — IOHEXOL 300 MG/ML  SOLN
100.0000 mL | Freq: Once | INTRAMUSCULAR | Status: AC | PRN
  Administered 2021-12-27: 100 mL via INTRAVENOUS

## 2021-12-27 MED ORDER — PROPOFOL 10 MG/ML IV BOLUS
1.0000 mg/kg | Freq: Once | INTRAVENOUS | Status: DC
  Filled 2021-12-27: qty 20

## 2021-12-27 MED ORDER — LIDOCAINE HCL (PF) 1 % IJ SOLN
5.0000 mL | Freq: Once | INTRAMUSCULAR | Status: AC
Start: 2021-12-27 — End: 2021-12-27
  Administered 2021-12-27: 5 mL
  Filled 2021-12-27: qty 5

## 2021-12-27 MED ORDER — ONDANSETRON HCL 4 MG/2ML IJ SOLN: 4.0000 mg | Freq: Four times a day (QID) | INTRAMUSCULAR | Status: DC | PRN

## 2021-12-27 MED ORDER — HYDROMORPHONE HCL 1 MG/ML IJ SOLN
1.0000 mg | Freq: Once | INTRAMUSCULAR | Status: DC
  Administered 2021-12-27: 1 mg via INTRAVENOUS
  Filled 2021-12-27: qty 1

## 2021-12-27 MED ORDER — PHENYLEPHRINE 80 MCG/ML (10ML) SYRINGE FOR IV PUSH (FOR BLOOD PRESSURE SUPPORT)
80.0000 ug | PREFILLED_SYRINGE | Freq: Once | INTRAVENOUS | Status: DC | PRN
  Filled 2021-12-27: qty 10

## 2021-12-27 MED ORDER — MORPHINE SULFATE (PF) 2 MG/ML IV SOLN
2.0000 mg | INTRAVENOUS | Status: DC | PRN
  Administered 2021-12-28 (×3): 4 mg via INTRAVENOUS
  Filled 2021-12-27 (×3): qty 2

## 2021-12-27 MED ORDER — ACETAMINOPHEN 500 MG PO TABS
1000.0000 mg | ORAL_TABLET | Freq: Four times a day (QID) | ORAL | Status: DC
  Administered 2021-12-27 – 2022-01-04 (×30): 1000 mg via ORAL
  Filled 2021-12-27 (×31): qty 2

## 2021-12-27 MED ORDER — CEFAZOLIN SODIUM-DEXTROSE 2-4 GM/100ML-% IV SOLN
2.0000 g | Freq: Once | INTRAVENOUS | Status: AC
Start: 2021-12-27 — End: 2021-12-27
  Administered 2021-12-27: 2 g via INTRAVENOUS
  Filled 2021-12-27: qty 100

## 2021-12-27 MED ORDER — ONDANSETRON 4 MG PO TBDP: 4.0000 mg | ORAL_TABLET | Freq: Four times a day (QID) | ORAL | Status: DC | PRN

## 2021-12-27 MED ORDER — ONDANSETRON HCL 4 MG/2ML IJ SOLN
4.0000 mg | Freq: Once | INTRAMUSCULAR | Status: AC
  Administered 2021-12-27: 4 mg via INTRAVENOUS
  Filled 2021-12-27: qty 2

## 2021-12-27 MED ORDER — TETANUS-DIPHTH-ACELL PERTUSSIS 5-2.5-18.5 LF-MCG/0.5 IM SUSY
0.5000 mL | PREFILLED_SYRINGE | Freq: Once | INTRAMUSCULAR | Status: AC
  Administered 2021-12-27: 0.5 mL via INTRAMUSCULAR
  Filled 2021-12-27: qty 0.5

## 2021-12-27 MED ORDER — LIDOCAINE 5 % EX PTCH
1.0000 | MEDICATED_PATCH | CUTANEOUS | Status: DC
  Administered 2021-12-27 – 2022-01-01 (×5): 1 via TRANSDERMAL
  Filled 2021-12-27 (×7): qty 1

## 2021-12-27 MED ORDER — KETOROLAC TROMETHAMINE 15 MG/ML IJ SOLN
30.0000 mg | Freq: Four times a day (QID) | INTRAMUSCULAR | Status: DC
  Administered 2021-12-27 – 2021-12-31 (×14): 30 mg via INTRAVENOUS
  Filled 2021-12-27 (×15): qty 2

## 2021-12-27 MED ORDER — ENOXAPARIN SODIUM 30 MG/0.3ML IJ SOSY
30.0000 mg | PREFILLED_SYRINGE | Freq: Two times a day (BID) | INTRAMUSCULAR | Status: DC
  Administered 2021-12-28 – 2022-01-04 (×14): 30 mg via SUBCUTANEOUS
  Filled 2021-12-27 (×16): qty 0.3

## 2021-12-27 MED ORDER — PROPOFOL 10 MG/ML IV BOLUS
INTRAVENOUS | Status: AC | PRN
  Administered 2021-12-27 (×2): 20 mg via INTRAVENOUS
  Administered 2021-12-27: 40 mg via INTRAVENOUS

## 2021-12-27 MED ORDER — DOCUSATE SODIUM 100 MG PO CAPS
100.0000 mg | ORAL_CAPSULE | Freq: Two times a day (BID) | ORAL | Status: DC
  Administered 2021-12-27 – 2022-01-04 (×15): 100 mg via ORAL
  Filled 2021-12-27 (×15): qty 1

## 2021-12-27 MED ORDER — METHOCARBAMOL 500 MG PO TABS
1000.0000 mg | ORAL_TABLET | Freq: Three times a day (TID) | ORAL | Status: DC
  Administered 2021-12-27 – 2021-12-28 (×2): 1000 mg via ORAL
  Filled 2021-12-27 (×2): qty 2

## 2021-12-27 MED ORDER — LACTATED RINGERS IV SOLN: INTRAVENOUS | Status: DC

## 2021-12-27 MED ORDER — MORPHINE SULFATE (PF) 4 MG/ML IV SOLN
4.0000 mg | Freq: Once | INTRAVENOUS | Status: AC
  Administered 2021-12-27: 4 mg via INTRAVENOUS
  Filled 2021-12-27: qty 1

## 2021-12-27 NOTE — ED Provider Notes (Signed)
?[facility] ?Provider Note ? ?CSN: [id number] ?Arrival date & time: 12/27/21 1728 ? ?Chief Complaint(s) ?No chief complaint on file. ? ?HPI ?Daron OfferSusan L Jeon is a 61 y.o. female with PMH paroxysmal A-fib who presents emergency department as a level 2 trauma after being struck in the back by a large tree branch.  Patient states that she was gardening with a community garden project at the [employer] when a large open branch fell from the sky and crushed her from behind.  She arrives with complaints of left shoulder pain, left back pain, difficulty breathing and right ankle pain.  She is alert and oriented on arrival with stable vital signs. ? ? ?Past Medical History ?Past Medical History:  ?Diagnosis Date  ? Paroxysmal atrial fibrillation (HCC)   ? ?Patient Active Problem List  ? Diagnosis Date Noted  ? Persistent atrial fibrillation (HCC) 04/12/2021  ? Paroxysmal atrial fibrillation (HCC) 01/19/2021  ? ?Home Medication(s) ?Prior to Admission medications   ?Medication Sig Start Date End Date Taking? Authorizing Provider  ?flecainide (TAMBOCOR) 50 MG tablet TAKE 1 TABLET(50 MG) BY MOUTH TWICE DAILY 11/04/21   Fenton, Clint R, PA  ?fluticasone (CUTIVATE) 0.05 % cream Apply topically 2 (two) times daily as needed. 08/29/21   [provider]  ?latanoprost (XALATAN) 0.005 % ophthalmic solution Place 1 drop into both eyes at bedtime. 01/07/21   [provider]  ?metoprolol succinate (TOPROL XL) 25 MG 24 hr tablet Take 0.5 tablets (12.5 mg total) by mouth daily. 11/04/21   Fenton, Clint R, PA  ?                                                                                                                                  ?Past Surgical History ?No past surgical history on file. ?Family History ?No family history on file. ? ?Social History ?Social History  ? ?Tobacco Use  ? Smoking status: Never  ? Smokeless tobacco: Never  ? Tobacco comments:  ?  Never smoke 11/04/21   ?Substance Use Topics  ? Alcohol use: Yes  ?  Alcohol/week: 5.0 - 10.0 standard drinks  ?  Types: 5 - 10 Glasses of wine per week  ?  Comment: 1-2 glasses nightly 08/05/2021  ? Drug use: Never  ? ?Allergies ?Elemental sulfur and Sulfa antibiotics ? ?Review of Systems ?Review of Systems  ?Respiratory:  Positive for shortness of breath.   ?Cardiovascular:  Positive for chest pain.  ?Musculoskeletal:  Positive for back pain, joint swelling and myalgias.  ? ?Physical Exam ?Vital Signs  ?I have reviewed the triage vital signs ?BP 128/64   Pulse 67   Temp 98.8 ?F (37.1 ?C)   Resp (!) 23   Ht 5\' 7"  (1.702 m)   Wt 86.6 kg   SpO2 95%   BMI 29.91 kg/m?  ? ?Physical Exam ?Vitals and nursing note reviewed.  ?Constitutional:   ?  General: She is not in acute distress. ?   Appearance: She is well-developed. She is ill-appearing.  ?HENT:  ?   Head: Normocephalic and atraumatic.  ?Eyes:  ?   Conjunctiva/sclera: Conjunctivae normal.  ?Cardiovascular:  ?   Rate and Rhythm: Normal rate. Rhythm irregular.  ?   Heart sounds: No murmur heard. ?Pulmonary:  ?   Effort: Pulmonary effort is normal. No respiratory distress.  ?   Breath sounds: Normal breath sounds.  ?Chest:  ?   Chest wall: Tenderness present.  ?Abdominal:  ?   Palpations: Abdomen is soft.  ?   Tenderness: There is abdominal tenderness.  ?Musculoskeletal:     ?   General: Swelling, tenderness, deformity and signs of injury present.  ?   Cervical back: Neck supple.  ?Skin: ?   General: Skin is warm and dry.  ?   Capillary Refill: Capillary refill takes less than 2 seconds.  ?Neurological:  ?   Mental Status: She is alert.  ?Psychiatric:     ?   Mood and Affect: Mood normal.  ? ? ?ED Results and Treatments ?Labs ?(all labs ordered are listed, but only abnormal results are displayed) ?Labs Reviewed  ?COMPREHENSIVE METABOLIC PANEL - Abnormal; Notable for the following components:  ?    Result Value  ? Glucose, Bld 106 (*)   ? Total Protein 6.2 (*)   ? All other  components within normal limits  ?CBC - Abnormal; Notable for the following components:  ? MCH 35.0 (*)   ? All other components within normal limits  ?I-STAT CHEM 8, ED - Abnormal; Notable for the following components:  ? Glucose, Bld 106 (*)   ? Calcium, Ion 1.11 (*)   ? All other components within normal limits  ?RESP PANEL BY RT-PCR (FLU A&B, COVID) ARPGX2  ?ETHANOL  ?LACTIC ACID, PLASMA  ?PROTIME-INR  ?URINALYSIS, ROUTINE W REFLEX MICROSCOPIC  ?SAMPLE TO BLOOD BANK  ?                                                                                                                       ? ?Radiology ?DG Ankle Complete Right ? ?Result Date: 12/27/2021 ?CLINICAL DATA:  Fracture, postreduction. EXAM: RIGHT ANKLE - COMPLETE 3+ VIEW COMPARISON:  Radiograph earlier today. FINDINGS: Improved alignment of trimalleolar fracture postreduction. Improved mortise alignment with minimal residual displacement. Overlying splint material in place which limits osseous and soft tissue fine detail. IMPRESSION: Improved alignment of trimalleolar fracture postreduction. Electronically Signed   By: Narda Rutherford M.D.   On: 12/27/2021 19:24  ? ?CT Head Wo Contrast ? ?Result Date: 12/27/2021 ?CLINICAL DATA:  Head trauma, moderate to severe. Struck by a tree branch. Obvious deformity of the right ankle and left shoulder/arm. EXAM: CT HEAD WITHOUT CONTRAST TECHNIQUE: Contiguous axial images were obtained from the base of the skull through the vertex without intravenous contrast. RADIATION DOSE REDUCTION: This exam was performed according to the departmental dose-optimization program which includes automated exposure control, adjustment  of the mA and/or kV according to patient size and/or use of iterative reconstruction technique. COMPARISON:  None Available. FINDINGS: Brain: The ventricles are normal in size and configuration. The basilar cisterns are patent. No mass, mass effect, or midline shift. No acute intracranial hemorrhage is seen.  No abnormal extra-axial fluid collection. Preservation of the normal cortical gray-white interface without CT evidence of an acute major vascular territorial cortical based infarction. Vascular: No hyperdense vessel or unexpected calcification. Skull: Normal. Negative for fracture or focal lesion. Sinuses/Orbits: The visualized orbits are unremarkable. Mild inferior left maxillary sinus mucosal opacification. The visualized mastoid air cells are clear. Other: None. IMPRESSION: No acute intracranial process. Mild inferior left maxillary sinus mucosal opacification. Electronically Signed   By: Neita Garnet M.D.   On: 12/27/2021 18:51  ? ?CT Cervical Spine Wo Contrast ? ?Result Date: 12/27/2021 ?CLINICAL DATA:  Trauma. EXAM: CT CERVICAL SPINE WITHOUT CONTRAST TECHNIQUE: Multidetector CT imaging of the cervical spine was performed without intravenous contrast. Multiplanar CT image reconstructions were also generated. RADIATION DOSE REDUCTION: This exam was performed according to the departmental dose-optimization program which includes automated exposure control, adjustment of the mA and/or kV according to patient size and/or use of iterative reconstruction technique. COMPARISON:  Cervical spine radiographs 11/29/2017 FINDINGS: Alignment: There is 1-2 mm grade 1 anterolisthesis of C5 on C6, 2 mm grade 1 anterolisthesis of C6 on C7, and 1-2 mm grade 1 anterolisthesis of C7 on T1, the atlantodens interval is intact with mild-to-moderate degenerative change. The facet joints are appropriately aligned. Unchanged from prior 11/29/2017 radiographs. Skull base and vertebrae: Vertebral body heights are maintained. Minimal anterior C5-6 endplate spurring with tiny ossicle anterior to the C5-6 disc space. Minimal posterior C5-6: Mild-to-moderate diffuse C7-T1 and mild diffuse T1-2 disc space narrowing. No acute fracture is seen. Soft tissues and spinal canal: No prevertebral fluid or swelling. No visible canal hematoma. Disc  levels: Multilevel degenerative changes including disc space narrowing, uncovertebral hypertrophy, and facet joint hypertrophy contribute to moderate right and mild left C5-6, mild-to-moderate right and mild left C6-7, an

## 2021-12-27 NOTE — Progress Notes (Signed)
Chaplain responded to Level 2 Trauma. Pt resting in bed with physician attending.  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  Pt denied needing spiritual care at this time. ? ?Terri Drake ?Chaplain ?

## 2021-12-27 NOTE — H&P (Addendum)
Reason for Consult/Chief Complaint:rib fx, ankle fx Consultant: Kommor, MD  Terri Drake is an 61 y.o. female.   HPI: 67F s/p hit by tree branch. No LOC. pAF at baseline: metoprolol, flecainide  Past Medical History:  Diagnosis Date   Paroxysmal atrial fibrillation (HCC)     No past surgical history on file.  No family history on file.  Social History:  reports that she has never smoked. She has never used smokeless tobacco. She reports current alcohol use of about 5.0 - 10.0 standard drinks per week. She reports that she does not use drugs.  Allergies:  Allergies  Allergen Reactions   Elemental Sulfur     REACTION: hives   Sulfa Antibiotics Rash    Medications: I have reviewed the patient's current medications.  Results for orders placed or performed during the hospital encounter of 12/27/21 (from the past 48 hour(s))  Comprehensive metabolic panel     Status: Abnormal   Collection Time: 12/27/21  5:46 PM  Result Value Ref Range   Sodium 140 135 - 145 mmol/L   Potassium 4.4 3.5 - 5.1 mmol/L   Chloride 108 98 - 111 mmol/L   CO2 23 22 - 32 mmol/L   Glucose, Bld 106 (H) 70 - 99 mg/dL    Comment: Glucose reference range applies only to samples taken after fasting for at least 8 hours.   BUN 8 8 - 23 mg/dL   Creatinine, Ser 9.52 0.44 - 1.00 mg/dL   Calcium 9.1 8.9 - 84.1 mg/dL   Total Protein 6.2 (L) 6.5 - 8.1 g/dL   Albumin 3.9 3.5 - 5.0 g/dL   AST 32 15 - 41 U/L   ALT 25 0 - 44 U/L   Alkaline Phosphatase 62 38 - 126 U/L   Total Bilirubin 0.6 0.3 - 1.2 mg/dL   GFR, Estimated >32 >44 mL/min    Comment: (NOTE) Calculated using the CKD-EPI Creatinine Equation (2021)    Anion gap 9 5 - 15    Comment: Performed at [facility], [address], [place], [place] [address]  CBC     Status: Abnormal   Collection Time: 12/27/21  5:46 PM  Result Value Ref Range   WBC 9.1 4.0 - 10.5 K/uL   RBC 4.09 3.87 - 5.11 MIL/uL   Hemoglobin 14.3 12.0 - 15.0 g/dL    HCT 25.3 66.4 - 40.3 %   MCV 99.3 80.0 - 100.0 fL   MCH 35.0 (H) 26.0 - 34.0 pg   MCHC 35.2 30.0 - 36.0 g/dL   RDW 47.4 25.9 - 56.3 %   Platelets 194 150 - 400 K/uL   nRBC 0.0 0.0 - 0.2 %    Comment: Performed at [facility], [address], [place], [place] [address]  Ethanol     Status: None   Collection Time: 12/27/21  5:46 PM  Result Value Ref Range   Alcohol, Ethyl (B) <10 <10 mg/dL    Comment: (NOTE) Lowest detectable limit for serum alcohol is 10 mg/dL.  For medical purposes only. Performed at [facility], [address], [place], [place] [address]   Lactic acid, plasma     Status: None   Collection Time: 12/27/21  5:46 PM  Result Value Ref Range   Lactic Acid, Venous 1.7 0.5 - 1.9 mmol/L    Comment: Performed at [facility], [address], [place], [place] [address]  Protime-INR     Status:  None   Collection Time: 12/27/21  5:46 PM  Result Value Ref Range   Prothrombin Time 13.1 11.4 - 15.2 seconds   INR 1.0 0.8 - 1.2    Comment: (NOTE) INR goal varies based on device and disease states. Performed at [facility], [address] [place], [place] [address]   Sample to Blood Bank     Status: None   Collection Time: 12/27/21  5:46 PM  Result Value Ref Range   Blood Bank Specimen SAMPLE AVAILABLE FOR TESTING    Sample Expiration      12/28/2021,2359 Performed at [facility], [address], [place], [place] [address]   I-Stat Chem 8, ED     Status: Abnormal   Collection Time: 12/27/21  5:55 PM  Result Value Ref Range   Sodium 139 135 - 145 mmol/L   Potassium 4.3 3.5 - 5.1 mmol/L   Chloride 106 98 - 111 mmol/L   BUN 10 8 - 23 mg/dL   Creatinine, Ser 3.24 0.44 - 1.00 mg/dL   Glucose, Bld 401 (H) 70 - 99 mg/dL    Comment: Glucose reference range applies only to samples taken after fasting for at least 8 hours.   Calcium, Ion 1.11 (L) 1.15 - 1.40 mmol/L   TCO2 23 22 - 32 mmol/L   Hemoglobin 14.3 12.0 - 15.0 g/dL   HCT 02.7  25.3 - 66.4 %    DG Ankle Complete Right  Result Date: 12/27/2021 CLINICAL DATA:  Fracture, postreduction. EXAM: RIGHT ANKLE - COMPLETE 3+ VIEW COMPARISON:  Radiograph earlier today. FINDINGS: Improved alignment of trimalleolar fracture postreduction. Improved mortise alignment with minimal residual displacement. Overlying splint material in place which limits osseous and soft tissue fine detail. IMPRESSION: Improved alignment of trimalleolar fracture postreduction. Electronically Signed   By: Narda Rutherford M.D.   On: 12/27/2021 19:24   CT Head Wo Contrast  Result Date: 12/27/2021 CLINICAL DATA:  Head trauma, moderate to severe. Struck by a tree branch. Obvious deformity of the right ankle and left shoulder/arm. EXAM: CT HEAD WITHOUT CONTRAST TECHNIQUE: Contiguous axial images were obtained from the base of the skull through the vertex without intravenous contrast. RADIATION DOSE REDUCTION: This exam was performed according to the departmental dose-optimization program which includes automated exposure control, adjustment of the mA and/or kV according to patient size and/or use of iterative reconstruction technique. COMPARISON:  None Available. FINDINGS: Brain: The ventricles are normal in size and configuration. The basilar cisterns are patent. No mass, mass effect, or midline shift. No acute intracranial hemorrhage is seen. No abnormal extra-axial fluid collection. Preservation of the normal cortical gray-white interface without CT evidence of an acute major vascular territorial cortical based infarction. Vascular: No hyperdense vessel or unexpected calcification. Skull: Normal. Negative for fracture or focal lesion. Sinuses/Orbits: The visualized orbits are unremarkable. Mild inferior left maxillary sinus mucosal opacification. The visualized mastoid air cells are clear. Other: None. IMPRESSION: No acute intracranial process. Mild inferior left maxillary sinus mucosal opacification. Electronically  Signed   By: Neita Garnet M.D.   On: 12/27/2021 18:51   CT Cervical Spine Wo Contrast  Result Date: 12/27/2021 CLINICAL DATA:  Trauma. EXAM: CT CERVICAL SPINE WITHOUT CONTRAST TECHNIQUE: Multidetector CT imaging of the cervical spine was performed without intravenous contrast. Multiplanar CT image reconstructions were also generated. RADIATION DOSE REDUCTION: This exam was performed according to the departmental dose-optimization program which includes automated exposure control, adjustment of the mA and/or kV according to patient size and/or  use of iterative reconstruction technique. COMPARISON:  Cervical spine radiographs 11/29/2017 FINDINGS: Alignment: There is 1-2 mm grade 1 anterolisthesis of C5 on C6, 2 mm grade 1 anterolisthesis of C6 on C7, and 1-2 mm grade 1 anterolisthesis of C7 on T1, the atlantodens interval is intact with mild-to-moderate degenerative change. The facet joints are appropriately aligned. Unchanged from prior 11/29/2017 radiographs. Skull base and vertebrae: Vertebral body heights are maintained. Minimal anterior C5-6 endplate spurring with tiny ossicle anterior to the C5-6 disc space. Minimal posterior C5-6: Mild-to-moderate diffuse C7-T1 and mild diffuse T1-2 disc space narrowing. No acute fracture is seen. Soft tissues and spinal canal: No prevertebral fluid or swelling. No visible canal hematoma. Disc levels: Multilevel degenerative changes including disc space narrowing, uncovertebral hypertrophy, and facet joint hypertrophy contribute to moderate right and mild left C5-6, mild-to-moderate right and mild left C6-7, and mild left C7-T1 neuroforaminal narrowing. No significant central canal stenosis. Upper chest: There is ground-glass opacification within the lung apices. Other: There is mild scattered air within the soft tissues of the predominantly posterolateral left superior hemithorax peripheral extrapleural space. Tiny foci of air seen within the left subclavian vein (axial  series 6 images 94 through 98). This may be related to placement of an IV. Note is made that IV contrast was utilized for contemporaneous CT chest abdomen and pelvis. IMPRESSION:: IMPRESSION: 1. No acute fracture. 2. Multilevel degenerative disc and joint changes as above. 3. Tiny foci of air superior to the posterolateral left lung. Correlating with contemporaneous CT of the chest, there appears to be a nondisplaced left second rib fracture, and there is a displaced left scapular fracture. These findings likely relate to a superior left pleural hematoma. These findings were discussed with Dr. Glendora Score by Dr. Jasmine Pang at time of interpretation of the contemporaneous CT of the chest. 4. Tiny foci of air within the nondependent aspect of the left subclavian vein, likely incidental from placement of an IV. Electronically Signed   By: Neita Garnet M.D.   On: 12/27/2021 19:09   DG Pelvis Portable  Result Date: 12/27/2021 CLINICAL DATA:  Struck by tree branch. EXAM: PORTABLE PELVIS 1-2 VIEWS COMPARISON:  None Available. FINDINGS: There is no evidence of pelvic fracture or diastasis. No pelvic bone lesions are seen. IMPRESSION: Negative. Electronically Signed   By: Paulina Fusi M.D.   On: 12/27/2021 17:58   CT CHEST ABDOMEN PELVIS W CONTRAST  Result Date: 12/27/2021 CLINICAL DATA:  Hit by tree branch EXAM: CT CHEST, ABDOMEN, AND PELVIS WITH CONTRAST TECHNIQUE: Multidetector CT imaging of the chest, abdomen and pelvis was performed following the standard protocol during bolus administration of intravenous contrast. RADIATION DOSE REDUCTION: This exam was performed according to the departmental dose-optimization program which includes automated exposure control, adjustment of the mA and/or kV according to patient size and/or use of iterative reconstruction technique. CONTRAST:  OMNIPAQUE IOHEXOL 300 MG/ML  SOLN COMPARISON:  Chest x-ray 12/27/2021 FINDINGS: CT CHEST FINDINGS Cardiovascular:  Nonaneurysmal aorta. Normal cardiac size. No pericardial effusion. Normal aortic contour. Mediastinum/Nodes: Midline trachea. No thyroid mass. Esophagus within normal limits. No suspicious lymph nodes Lungs/Pleura: Mild left apical pleural thickening. Minimal foci of gas in the pleural space at left apex without sizable pneumothorax. No consolidation or pleural effusion. Musculoskeletal: Sternum is intact. Acute mildly displaced inferior scapular fracture. Small amount of gas between the left first and second ribs with findings suspicious for nondisplaced left second rib fracture on sagittal reconstructions. Edema within the subcutaneous posterior soft tissues overlying  the shoulder. CT ABDOMEN PELVIS FINDINGS Hepatobiliary: Subcentimeter hypodensities too small to further characterize. No calcified gallstone. No biliary dilatation Pancreas: Unremarkable. No pancreatic ductal dilatation or surrounding inflammatory changes. Spleen: Normal in size without focal abnormality. Adrenals/Urinary Tract: Adrenal glands are unremarkable. Kidneys are normal, without renal calculi, focal lesion, or hydronephrosis. Bladder is unremarkable. Stomach/Bowel: Stomach is within normal limits. Appendix appears normal. No evidence of bowel wall thickening, distention, or inflammatory changes. Vascular/Lymphatic: Mild atherosclerosis. No aneurysm. No suspicious lymph nodes Reproductive: Uterus and bilateral adnexa are unremarkable. Other: Negative for pelvic effusion or free air Musculoskeletal: No acute osseous abnormality. Thoracic and lumbar spine are dictated separately. IMPRESSION: 1. No CT evidence for acute mediastinal injury. No CT evidence for acute solid organ injury within abdomen pelvis. No free air is seen. 2. Acute displaced left inferior scapular fracture. Acute nondisplaced left second rib fracture with small amount of soft tissue gas between the first and second ribs on the left side. Mild left apical pleural thickening  suspect secondary to small hematoma from adjacent rib trauma. Trace left apical pneumothorax. Critical Value/emergent results were called by telephone at the time of interpretation on 12/27/2021 at 7:07 pm to provider [facility] , who verbally acknowledged these results. Electronically Signed   By: Jasmine Pang M.D.   On: 12/27/2021 19:07   CT T-SPINE NO CHARGE  Result Date: 12/27/2021 CLINICAL DATA:  Struck by tree branch EXAM: CT Thoracic and Lumbar spine without contrast TECHNIQUE: Multiplanar CT images of the thoracic and lumbar spine were reconstructed from contemporary CT of the Chest, Abdomen, and Pelvis. RADIATION DOSE REDUCTION: This exam was performed according to the departmental dose-optimization program which includes automated exposure control, adjustment of the mA and/or kV according to patient size and/or use of iterative reconstruction technique. CONTRAST:  None or No additional COMPARISON:  None Available. FINDINGS: CT THORACIC SPINE FINDINGS Alignment: Normal. Vertebrae: Minimal superior endplate deformities at T11 and T12. Slight cortical buckling laterally of the T12 vertebral body, with mild adjacent edema suggestive of acute fracture. Vertebral body heights are maintained. Paraspinal and other soft tissues: Tiny left apical pneumothorax. Small foci of gas between the left first and second ribs. Acute nondisplaced left second rib fracture. Acute nondisplaced left sixth posterior rib fracture. Disc levels: Mild degenerative osteophytes. No abnormal disc space widening or narrowing. CT LUMBAR SPINE FINDINGS Segmentation: 5 lumbar type vertebrae. Alignment: Normal. Vertebrae: No acute fracture or focal pathologic process. Paraspinal and other soft tissues: Negative. Disc levels: No significant canal stenosis or foraminal narrowing at L1-L2 and L2-L3. At L3-L4, maintained disc space. Hypertrophic facet degenerative change. The foramen are patent bilaterally. At L4-L5, maintained disc space.  Hypertrophic facet degenerative changes. At least mild canal stenosis. Mild bilateral foraminal narrowing. At L5-S1, advanced disc space narrowing with endplate sclerosis and vacuum discs. Posterior central disc osteophyte complex with mass effect on thecal sac. No high-grade canal stenosis. Hypertrophic facet degenerative changes. Moderate bilateral foraminal narrowing by bony spurring. IMPRESSION: 1. Possible acute mild superior endplate fractures at T11 and T12, correlate for level of tenderness. 2. No CT evidence for acute osseous abnormality of the lumbar spine 3. Nondisplaced left second and sixth rib fractures. Trace left apical pneumothorax Electronically Signed   By: Jasmine Pang M.D.   On: 12/27/2021 19:22   CT L-SPINE NO CHARGE  Result Date: 12/27/2021 CLINICAL DATA:  Struck by tree branch EXAM: CT Thoracic and Lumbar spine without contrast TECHNIQUE: Multiplanar CT images of the thoracic and lumbar spine were reconstructed  from contemporary CT of the Chest, Abdomen, and Pelvis. RADIATION DOSE REDUCTION: This exam was performed according to the departmental dose-optimization program which includes automated exposure control, adjustment of the mA and/or kV according to patient size and/or use of iterative reconstruction technique. CONTRAST:  None or No additional COMPARISON:  None Available. FINDINGS: CT THORACIC SPINE FINDINGS Alignment: Normal. Vertebrae: Minimal superior endplate deformities at T11 and T12. Slight cortical buckling laterally of the T12 vertebral body, with mild adjacent edema suggestive of acute fracture. Vertebral body heights are maintained. Paraspinal and other soft tissues: Tiny left apical pneumothorax. Small foci of gas between the left first and second ribs. Acute nondisplaced left second rib fracture. Acute nondisplaced left sixth posterior rib fracture. Disc levels: Mild degenerative osteophytes. No abnormal disc space widening or narrowing. CT LUMBAR SPINE FINDINGS  Segmentation: 5 lumbar type vertebrae. Alignment: Normal. Vertebrae: No acute fracture or focal pathologic process. Paraspinal and other soft tissues: Negative. Disc levels: No significant canal stenosis or foraminal narrowing at L1-L2 and L2-L3. At L3-L4, maintained disc space. Hypertrophic facet degenerative change. The foramen are patent bilaterally. At L4-L5, maintained disc space. Hypertrophic facet degenerative changes. At least mild canal stenosis. Mild bilateral foraminal narrowing. At L5-S1, advanced disc space narrowing with endplate sclerosis and vacuum discs. Posterior central disc osteophyte complex with mass effect on thecal sac. No high-grade canal stenosis. Hypertrophic facet degenerative changes. Moderate bilateral foraminal narrowing by bony spurring. IMPRESSION: 1. Possible acute mild superior endplate fractures at T11 and T12, correlate for level of tenderness. 2. No CT evidence for acute osseous abnormality of the lumbar spine 3. Nondisplaced left second and sixth rib fractures. Trace left apical pneumothorax Electronically Signed   By: Jasmine Pang M.D.   On: 12/27/2021 19:22   DG Chest Port 1 View  Result Date: 12/27/2021 CLINICAL DATA:  Level 2 trauma.  Struck by tree branch. EXAM: PORTABLE CHEST 1 VIEW COMPARISON:  04/03/2021 FINDINGS: Heart size is normal. The right chest is clear. Question hazy opacity in the left lung that could be contusion or aspiration. I do not see a pneumothorax or hemothorax. No left rib fracture or scapular region fracture is seen. Old deformity of the distal clavicle. IMPRESSION: No definite acute finding. Question hazy/patchy density in the left lung that could be contusion or aspiration. No other finding. Electronically Signed   By: Paulina Fusi M.D.   On: 12/27/2021 17:57   DG Shoulder Left Port  Result Date: 12/27/2021 CLINICAL DATA:  Trauma, struck by a tree branch. Left arm and shoulder pain. EXAM: LEFT SHOULDER COMPARISON:  Included portion from  chest CT earlier today FINDINGS: Comminuted left scapular body fracture was better delineated on chest CT earlier today. There is no extension to the glenohumeral joint. No additional fracture. Normal shoulder alignment. IMPRESSION: Comminuted left scapular body fracture. No additional fracture of the left shoulder. Electronically Signed   By: Narda Rutherford M.D.   On: 12/27/2021 19:21   DG Knee Left Port  Result Date: 12/27/2021 CLINICAL DATA:  Trauma, struck by a tree branch. EXAM: PORTABLE LEFT KNEE - 1-2 VIEW COMPARISON:  None Available. FINDINGS: No evidence of fracture, dislocation, or joint effusion. Trace patellar spurring. No other evidence of arthropathy or other focal bone abnormality. Soft tissues are unremarkable. IMPRESSION: No fracture or subluxation of the left knee. Electronically Signed   By: Narda Rutherford M.D.   On: 12/27/2021 19:22   DG Ankle Right Port  Result Date: 12/27/2021 CLINICAL DATA:  Struck by tree branch.  Pain and deformity. EXAM: PORTABLE RIGHT ANKLE - 2 VIEW COMPARISON:  None FINDINGS: Fracture dislocation at the ankle joint. Comminuted transverse fracture of the medial malleolus. Oblique fracture of the distal fibula. Fracture of the posterior lip of the tibia. Talus is displaced laterally and posteriorly. No fracture of the talus is seen. IMPRESSION: Trimalleolar fracture dislocation as above. Electronically Signed   By: Paulina Fusi M.D.   On: 12/27/2021 17:59   DG Humerus Left  Result Date: 12/27/2021 CLINICAL DATA:  Trauma, struck by a tree branch. Left shoulder and arm pain. EXAM: LEFT HUMERUS - 2+ VIEW COMPARISON:  None Available. FINDINGS: The cortical margins of the humerus are intact. There is no evidence of fracture or other focal bone lesions. Elbow alignment is maintained. Soft tissues are unremarkable. IMPRESSION: No fracture of the left humerus. Electronically Signed   By: Narda Rutherford M.D.   On: 12/27/2021 19:23    ROS 10 point review of systems  is negative except as listed above in HPI.   Physical Exam Blood pressure 128/64, pulse 67, temperature 98.8 F (37.1 C), resp. rate (!) 23, height 5\' 7"  (1.702 m), weight 86.6 kg, SpO2 95 %. Constitutional: well-developed, well-nourished HEENT: pupils equal, round, reactive to light, 2mm b/l, moist conjunctiva, external inspection of ears and nose normal, hearing intact Oropharynx: normal oropharyngeal mucosa, normal dentition Neck: no thyromegaly, trachea midline, no midline cervical tenderness to palpation Chest: breath sounds equal bilaterally, normal respiratory effort, + L lateral chest wall tenderness to palpation/deformity Abdomen: soft, NT, no bruising, no hepatosplenomegaly GU: normal female genitalia  Back: no wounds, + thoracic spine tenderness to palpation, no thoracic/lumbar spine stepoffs Rectal: deferred Extremities: 2+ radial pulses bilaterally, intact motor and sensation bilateral UE and LE, no peripheral edema MSK: normal gait/station, no clubbing/cyanosis of fingers/toes, normal ROM of all four extremities Skin: warm, dry, no rashes Psych: normal memory, normal mood/affect     Assessment/Plan: 68F s/p hit by tree branch.   T11/12 endplate frx - NSGY c/s, Dr. Conchita Paris R ankle fx - Ortho c/s, Dr. Aundria Rud, CT ankle for operative planning, NWB, surgery pending swelling, can be done IP or OP L rib fx 2,6 - IS, pulm toilet, pain control L PTX - not visible to me, CXR in AM L scapula fx - ortho c/s, sling, WBAT Abrasions to L shoulder and L knee - local wound care FEN - reg diet DVT - SCDs, LMWH Dispo - admit to inpatient, medsurg with tele   Diamantina Monks, MD General and Trauma Surgery [facility]

## 2021-12-27 NOTE — ED Notes (Signed)
Hold Zofran per pharmacy, ?

## 2021-12-27 NOTE — ED Notes (Signed)
Pt transported to CT with Amanda, TRN.  

## 2021-12-27 NOTE — Consult Note (Signed)
? ?ORTHOPAEDIC CONSULTATION ? ?REQUESTING PHYSICIAN: Md, Trauma, MD ? ?PCP:  Aretta Nip, MD ? ?Chief Complaint: Left shoulder and right ankle trauma ? ?HPI: ?Terri Drake is a 61 y.o. female who complains of left shoulder pain as well as left fingertip numbness of the small and ring fingers.  Also complaining of right ankle pain and deformity.  She was involved in a somewhat rare accident when a "branch fell onto her left posterior shoulder knocked her forward.  She landed on her knees as well as twisting injury to the right ankle and potentially her left elbow.  She was noted in the emergency department to have scapular body fracture on the left side as well as ankle fracture and dislocation of the right ankle.  This was a closed injury on both places.  She also has some contusion of the right knee and left knee which x-rays were revealed to be negative.  She is also dealing with some rib fractures and pulmonary contusion.  She will be admitted to the surgical trauma service.  Orthopedic surgery was consulted to evaluate for the right ankle injury and left scapula fracture.  She is right-hand-dominant.  She denies smoking or diabetes.  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  No history of previous trauma or surgery on the shoulder or ankle. ? ?Past Medical History:  ?Diagnosis Date  ? Paroxysmal atrial fibrillation (HCC)   ? ?No past surgical history on file. ?Social History  ? ?Socioeconomic History  ? Marital status: Married  ?  Spouse name: Not on file  ? Number of children: Not on file  ? Years of education: Not on file  ? Highest education level: Not on file  ?Occupational History  ? Not on file  ?Tobacco Use  ? Smoking status: Never  ? Smokeless tobacco: Never  ? Tobacco comments:  ?  Never smoke 11/04/21  ?Substance and Sexual Activity  ? Alcohol use: Yes  ?  Alcohol/week: 5.0 - 10.0 standard drinks  ?  Types: 5 - 10 Glasses of wine per week  ?  Comment: 1-2  glasses nightly 08/05/2021  ? Drug use: Never  ? Sexual activity: Not on file  ?Other Topics Concern  ? Not on file  ?Social History Narrative  ? Not on file  ? ?Social Determinants of Health  ? ?Financial Resource Strain: Not on file  ?Food Insecurity: Not on file  ?Transportation Needs: Not on file  ?Physical Activity: Not on file  ?Stress: Not on file  ?Social Connections: Not on file  ? ?No family history on file. ?Allergies  ?Allergen Reactions  ? Elemental Sulfur   ?  REACTION: hives  ? Gluten Meal Diarrhea  ? Sulfa Antibiotics Rash  ? ?Prior to Admission medications   ?Medication Sig Start Date End Date Taking? Authorizing Provider  ?flecainide (TAMBOCOR) 50 MG tablet TAKE 1 TABLET(50 MG) BY MOUTH TWICE DAILY ?Patient taking differently: Take 50 mg by mouth 2 (two) times daily. TAKE 1 TABLET(50 MG) BY MOUTH TWICE DAILY 11/04/21  Yes Fenton, Clint R, PA  ?fluticasone (CUTIVATE) 0.05 % cream Apply 1 application. topically 2 (two) times daily as needed. 08/29/21  Yes [provider]  ?latanoprost (XALATAN) 0.005 % ophthalmic solution Place 1 drop into both eyes at bedtime. 01/07/21  Yes [provider]  ?metoprolol succinate (TOPROL XL) 25 MG 24 hr tablet Take 0.5 tablets (12.5 mg total) by mouth daily. 11/04/21  Yes Fenton, Clint R,  PA  ? ?DG Knee 2 Views Right ? ?Result Date: 12/27/2021 ?CLINICAL DATA:  Rule out fracture. EXAM: RIGHT KNEE - 1-2 VIEW COMPARISON:  None Available. FINDINGS: There is no acute fracture no dislocation. The bones are well mineralized. No significant arthritic changes. No joint effusion. The soft tissues are unremarkable IMPRESSION: Negative. Electronically Signed   By: Anner Crete M.D.   On: 12/27/2021 22:37  ? ?DG Ankle Complete Right ? ?Result Date: 12/27/2021 ?CLINICAL DATA:  Fracture, postreduction. EXAM: RIGHT ANKLE - COMPLETE 3+ VIEW COMPARISON:  Radiograph earlier today. FINDINGS: Improved alignment of trimalleolar fracture postreduction. Improved mortise  alignment with minimal residual displacement. Overlying splint material in place which limits osseous and soft tissue fine detail. IMPRESSION: Improved alignment of trimalleolar fracture postreduction. Electronically Signed   By: Keith Rake M.D.   On: 12/27/2021 19:24  ? ?CT Head Wo Contrast ? ?Result Date: 12/27/2021 ?CLINICAL DATA:  Head trauma, moderate to severe. Struck by a tree branch. Obvious deformity of the right ankle and left shoulder/arm. EXAM: CT HEAD WITHOUT CONTRAST TECHNIQUE: Contiguous axial images were obtained from the base of the skull through the vertex without intravenous contrast. RADIATION DOSE REDUCTION: This exam was performed according to the departmental dose-optimization program which includes automated exposure control, adjustment of the mA and/or kV according to patient size and/or use of iterative reconstruction technique. COMPARISON:  None Available. FINDINGS: Brain: The ventricles are normal in size and configuration. The basilar cisterns are patent. No mass, mass effect, or midline shift. No acute intracranial hemorrhage is seen. No abnormal extra-axial fluid collection. Preservation of the normal cortical gray-white interface without CT evidence of an acute major vascular territorial cortical based infarction. Vascular: No hyperdense vessel or unexpected calcification. Skull: Normal. Negative for fracture or focal lesion. Sinuses/Orbits: The visualized orbits are unremarkable. Mild inferior left maxillary sinus mucosal opacification. The visualized mastoid air cells are clear. Other: None. IMPRESSION: No acute intracranial process. Mild inferior left maxillary sinus mucosal opacification. Electronically Signed   By: Yvonne Kendall M.D.   On: 12/27/2021 18:51  ? ?CT Cervical Spine Wo Contrast ? ?Result Date: 12/27/2021 ?CLINICAL DATA:  Trauma. EXAM: CT CERVICAL SPINE WITHOUT CONTRAST TECHNIQUE: Multidetector CT imaging of the cervical spine was performed without intravenous  contrast. Multiplanar CT image reconstructions were also generated. RADIATION DOSE REDUCTION: This exam was performed according to the departmental dose-optimization program which includes automated exposure control, adjustment of the mA and/or kV according to patient size and/or use of iterative reconstruction technique. COMPARISON:  Cervical spine radiographs 11/29/2017 FINDINGS: Alignment: There is 1-2 mm grade 1 anterolisthesis of C5 on C6, 2 mm grade 1 anterolisthesis of C6 on C7, and 1-2 mm grade 1 anterolisthesis of C7 on T1, the atlantodens interval is intact with mild-to-moderate degenerative change. The facet joints are appropriately aligned. Unchanged from prior 11/29/2017 radiographs. Skull base and vertebrae: Vertebral body heights are maintained. Minimal anterior C5-6 endplate spurring with tiny ossicle anterior to the C5-6 disc space. Minimal posterior C5-6: Mild-to-moderate diffuse C7-T1 and mild diffuse T1-2 disc space narrowing. No acute fracture is seen. Soft tissues and spinal canal: No prevertebral fluid or swelling. No visible canal hematoma. Disc levels: Multilevel degenerative changes including disc space narrowing, uncovertebral hypertrophy, and facet joint hypertrophy contribute to moderate right and mild left C5-6, mild-to-moderate right and mild left C6-7, and mild left C7-T1 neuroforaminal narrowing. No significant central canal stenosis. Upper chest: There is ground-glass opacification within the lung apices. Other: There is mild scattered air within the  soft tissues of the predominantly posterolateral left superior hemithorax peripheral extrapleural space. Tiny foci of air seen within the left subclavian vein (axial series 6 images 94 through 98). This may be related to placement of an IV. Note is made that IV contrast was utilized for contemporaneous CT chest abdomen and pelvis. IMPRESSION:: IMPRESSION: 1. No acute fracture. 2. Multilevel degenerative disc and joint changes as above.  3. Tiny foci of air superior to the posterolateral left lung. Correlating with contemporaneous CT of the chest, there appears to be a nondisplaced left second rib fracture, and there is a displaced left scapular fra

## 2021-12-27 NOTE — Progress Notes (Signed)
Trauma Event Note ? ? ? ?TRN at bedside to round. Provided IS, teachback education completed. Pulled 500. Family at bedside. CAGE AID. ? ?Last imported Vital Signs ?BP (!) 126/52   Pulse 65   Temp 98.8 ?F (37.1 ?C)   Resp 16   Ht 5\' 7"  (1.702 m)   Wt 191 lb (86.6 kg)   SpO2 98%   BMI 29.91 kg/m?  ? ?Trending CBC ?Recent Labs  ?  12/27/21 ?1746 12/27/21 ?1755  ?WBC 9.1  --   ?HGB 14.3 14.3  ?HCT 40.6 42.0  ?PLT 194  --   ? ? ?Trending Coag's ?Recent Labs  ?  12/27/21 ?1746  ?INR 1.0  ? ? ?Trending BMET ?Recent Labs  ?  12/27/21 ?1746 12/27/21 ?1755  ?NA 140 139  ?K 4.4 4.3  ?CL 108 106  ?CO2 23  --   ?BUN 8 10  ?CREATININE 0.85 0.80  ?GLUCOSE 106* 106*  ? ? ? ? ?Terri Drake  ?Trauma Response RN ? ?Please call TRN at [phone] for further assistance. ? ? ?  ?

## 2021-12-27 NOTE — ED Triage Notes (Addendum)
Patient BIB [facility]. Patient was struck by a tree branch, obvious deform right ankle, left shoulder/arm. A&Ox4. VSS. 200 mcg Fentanyl given via EMS. ?

## 2021-12-27 NOTE — Sedation Documentation (Signed)
Right ankle reduced by Kommor, MD.  ?

## 2021-12-27 NOTE — ED Notes (Signed)
Trauma Response Nurse Documentation ? ?Terri Drake is a 61 y.o. female arriving to [facility] ED via EMS ? ?Trauma was activated as a Level 2 by [employer] based on the following trauma criteria Discretion of Emergency Department Physician. Trauma team at the bedside on patient arrival. Patient cleared for CT by Dr. Posey Rea. Patient to CT with team. GCS 15. ? 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 She has an obvious right ankle fracture, pain across back and when breathing. ? ?History  ? Past Medical History:  ?Diagnosis Date  ? Paroxysmal atrial fibrillation (HCC)   ?  ? No past surgical history on file.  ? ?Initial Focused Assessment (If applicable, or please see trauma documentation): ?A&Ox4, GCS 15 ?Obvious R ankle deformity, pulse +1 ?Breath sounds equal bilaterally ?Abrasions to left shoulder, left knee, and back ? ?CT's Completed:   ?CT Head, CT C-Spine, CT Chest w/ contrast, and CT abdomen/pelvis w/ contrast  ? ?Interventions:  ?IV, Labs ?CXR/PXR/R ankle ?CT Head/C/T/Lspine/C/A/P ?Conscious sedation - Relocation of right ankle ? ?Consults completed:  ?Orthopaedic Surgeon at [phone]. ? ?Bedside handoff with ED RN Camryn.   ? ?Terri Drake  ?Trauma Response RN ? ?Please call TRN at [phone] for further assistance. ?  ?

## 2021-12-27 NOTE — TOC CAGE-AID Note (Signed)
Transition of Care (TOC) - CAGE-AID Screening ? ? ?Patient Details  ?Name: NATICA HEADRICK ?MRN: [id number] ?Date of Birth: [birth year] ? ?Transition of Care (TOC) CM/SW Contact:    ?Army Melia, RN ?Phone [phone] ?12/27/2021, 10:43 PM ? ? ? ? ?CAGE-AID Screening: ?  ? ?Have You Ever Felt You Ought to Cut Down on Your Drinking or Drug Use?: No ?Have People Annoyed You By Critizing Your Drinking Or Drug Use?: No ?Have You Felt Bad Or Guilty About Your Drinking Or Drug Use?: No ?Have You Ever Had a Drink or Used Drugs First Thing In The Morning to Steady Your Nerves or to Get Rid of a Hangover?: No ?CAGE-AID Score: 0 ? ?Substance Abuse Education Offered: No ? ?  ? ? ? ? ? ? ?

## 2021-12-27 NOTE — Progress Notes (Signed)
Orthopedic Tech Progress Note ?Patient Details:  ?Terri Drake ?01-30-1961 ?[id number] ? ?Level 2 trauma, not needed at this minute  ? ?Patient ID: Terri Drake, female   DOB: 05-21-1961, 61 y.o.   MRN: [id number] ? ?[name] ?12/27/2021, 6:13 PM ? ?

## 2021-12-27 NOTE — Progress Notes (Signed)
Case d/w EDP.  For the left scapula, sling for comfort only, and ok for ROM as toelrated and WBAT. ? ?For the ankle fracture, will need noncontrast CT post reduction, for surgical planning.  Otherwise NWB in splint.  If able to dc home from ER follow up in our office next week to discuss surgery.  If admitted we will see as full consult and will potentially plan for surgery on ankle later this week, depending on soft tissues. ?

## 2021-12-28 ENCOUNTER — Inpatient Hospital Stay (HOSPITAL_COMMUNITY): Payer: No Typology Code available for payment source

## 2021-12-28 LAB — BASIC METABOLIC PANEL
Anion gap: 4 — ABNORMAL LOW (ref 5–15)
BUN: 9 mg/dL (ref 8–23)
CO2: 22 mmol/L (ref 22–32)
Calcium: 8.3 mg/dL — ABNORMAL LOW (ref 8.9–10.3)
Chloride: 110 mmol/L (ref 98–111)
Creatinine, Ser: 0.74 mg/dL (ref 0.44–1.00)
GFR, Estimated: >60 mL/min (ref >60)
Glucose, Bld: 121 mg/dL — ABNORMAL HIGH (ref 70–99)
Potassium: 4.2 mmol/L (ref 3.5–5.1)
Sodium: 136 mmol/L (ref 135–145)

## 2021-12-28 LAB — CBC
HCT: 34.9 % — ABNORMAL LOW (ref 36.0–46.0)
Hemoglobin: 12.1 g/dL (ref 12.0–15.0)
MCH: 34.7 pg — ABNORMAL HIGH (ref 26.0–34.0)
MCHC: 34.7 g/dL (ref 30.0–36.0)
MCV: 100 fL (ref 80.0–100.0)
Platelets: 147 10*3/uL — ABNORMAL LOW (ref 150–400)
RBC: 3.49 MIL/uL — ABNORMAL LOW (ref 3.87–5.11)
RDW: 12.7 % (ref 11.5–15.5)
WBC: 7.1 10*3/uL (ref 4.0–10.5)
nRBC: 0 % (ref 0.0–0.2)

## 2021-12-28 LAB — URINALYSIS, ROUTINE W REFLEX MICROSCOPIC
Bilirubin Urine: NEGATIVE
Glucose, UA: NEGATIVE mg/dL
Hgb urine dipstick: NEGATIVE
Ketones, ur: NEGATIVE mg/dL
Leukocytes,Ua: NEGATIVE
Nitrite: NEGATIVE
Protein, ur: NEGATIVE mg/dL
Specific Gravity, Urine: 1.042 — ABNORMAL HIGH (ref 1.005–1.030)
pH: 5 (ref 5.0–8.0)

## 2021-12-28 LAB — SURGICAL PCR SCREEN
MRSA, PCR: NEGATIVE
Staphylococcus aureus: NEGATIVE

## 2021-12-28 LAB — HIV ANTIBODY (ROUTINE TESTING W REFLEX): HIV Screen 4th Generation wRfx: NONREACTIVE

## 2021-12-28 MED ORDER — LATANOPROST 0.005 % OP SOLN
1.0000 [drp] | Freq: Every day | OPHTHALMIC | Status: DC
  Administered 2022-01-02: 1 [drp] via OPHTHALMIC
  Filled 2021-12-28: qty 2.5

## 2021-12-28 MED ORDER — FLECAINIDE ACETATE 50 MG PO TABS
50.0000 mg | ORAL_TABLET | Freq: Two times a day (BID) | ORAL | Status: DC
Start: 2021-12-28 — End: 2022-01-02
  Administered 2021-12-28 – 2022-01-02 (×11): 50 mg via ORAL
  Filled 2021-12-28 (×13): qty 1

## 2021-12-28 MED ORDER — BACITRACIN ZINC 500 UNIT/GM EX OINT
TOPICAL_OINTMENT | Freq: Two times a day (BID) | CUTANEOUS | Status: DC
Start: 2021-12-28 — End: 2022-01-04
  Administered 2021-12-29 – 2022-01-04 (×11): 31.5 via TOPICAL
  Filled 2021-12-28: qty 28.35
  Filled 2021-12-28: qty 2.7
  Filled 2021-12-28 (×3): qty 28.35

## 2021-12-28 MED ORDER — LIDOCAINE HCL 1 % IJ SOLN
10.0000 mL | INTRAMUSCULAR | Status: AC
  Administered 2021-12-28: 10 mL

## 2021-12-28 MED ORDER — FENTANYL CITRATE PF 50 MCG/ML IJ SOSY
PREFILLED_SYRINGE | INTRAMUSCULAR | Status: AC
  Administered 2021-12-28: 50 ug via INTRAVENOUS
  Filled 2021-12-28: qty 1

## 2021-12-28 MED ORDER — METHOCARBAMOL 500 MG PO TABS
1000.0000 mg | ORAL_TABLET | Freq: Four times a day (QID) | ORAL | Status: DC
  Administered 2021-12-28 – 2022-01-02 (×19): 1000 mg via ORAL
  Filled 2021-12-28 (×24): qty 2

## 2021-12-28 MED ORDER — LIDOCAINE HCL (PF) 1 % IJ SOLN
INTRAMUSCULAR | Status: AC
  Filled 2021-12-28: qty 30

## 2021-12-28 MED ORDER — CEFAZOLIN SODIUM-DEXTROSE 2-4 GM/100ML-% IV SOLN
2.0000 g | INTRAVENOUS | Status: AC
  Administered 2021-12-29: 2 g via INTRAVENOUS
  Filled 2021-12-28: qty 100

## 2021-12-28 MED ORDER — CHLORHEXIDINE GLUCONATE CLOTH 2 % EX PADS
6.0000 | MEDICATED_PAD | Freq: Every day | CUTANEOUS | Status: DC
  Administered 2021-12-28 – 2022-01-04 (×7): 6 via TOPICAL

## 2021-12-28 MED ORDER — BUPIVACAINE HCL (PF) 0.5 % IJ SOLN
10.0000 mL | INTRAMUSCULAR | Status: AC
  Administered 2021-12-28: 10 mL
  Filled 2021-12-28: qty 10

## 2021-12-28 MED ORDER — OXYCODONE HCL 5 MG PO TABS
10.0000 mg | ORAL_TABLET | ORAL | Status: DC | PRN
  Administered 2021-12-28 – 2021-12-29 (×3): 10 mg via ORAL
  Filled 2021-12-28 (×3): qty 2

## 2021-12-28 MED ORDER — METOPROLOL SUCCINATE ER 25 MG PO TB24
12.5000 mg | ORAL_TABLET | Freq: Every day | ORAL | Status: DC
  Administered 2021-12-28 – 2022-01-01 (×5): 12.5 mg via ORAL
  Filled 2021-12-28 (×5): qty 1

## 2021-12-28 MED ORDER — FENTANYL CITRATE PF 50 MCG/ML IJ SOSY: 50.0000 ug | PREFILLED_SYRINGE | Freq: Once | INTRAMUSCULAR | Status: AC

## 2021-12-28 NOTE — Progress Notes (Signed)
Orthopedic Tech Progress Note ?Patient Details:  ?WANA MOUNT ?1961-06-06 ?[id number] ? ?Ortho Devices ?Type of Ortho Device: Post (short leg) splint, Stirrup splint ?Ortho Device/Splint Location: RLE ?Ortho Device/Splint Interventions: Application ?  ?Post Interventions ?Patient Tolerated: Well ? ?Kaysie Michelini E Geovanna Simko ?12/28/2021, 2:05 PM ? ?

## 2021-12-28 NOTE — Progress Notes (Signed)
Orthopedic Tech Progress Note ?Patient Details:  ?Terri Drake ?[birth year] ?[id number] ?Orthopedic PA wanted to redo a reduction of this patient's ankle and has [place] apply a plaster short leg splint with stirrups and heavy padding.  ?Patient ID: Terri Drake, female   DOB: June 04, 1961, 61 y.o.   MRN: [id number] ? ?[name] ?12/28/2021, 3:12 PM ? ?

## 2021-12-28 NOTE — ED Notes (Signed)
Breakfast orders placed 

## 2021-12-28 NOTE — Progress Notes (Addendum)
? ?Progress Note ? ?   ?Subjective: ?Pt reports pain in L shoulder and ribs and RLE. Breathing feels better than it did initially and she is already pulling 750 on IS. She denies abdominal pain or nausea and vomiting. She is concerned that she has had IVF and plenty of PO fluids and has not peed yet. She is concerned that wounds on her back may need to be cleansed given where she fell. She reports some numbness and weakness to LUE. She reports her husband is bringing her home meds for A. Fib.  ? ?Objective: ?Vital signs in last 24 hours: ?Temp:  [97.9 ?F (36.6 ?C)-98.8 ?F (37.1 ?C)] 97.9 ?F (36.6 ?C) (05/10 0543) ?Pulse Rate:  [55-71] 56 (05/10 0630) ?Resp:  [12-23] 16 (05/10 0630) ?BP: (114-148)/(52-72) 128/60 (05/10 0630) ?SpO2:  [91 %-100 %] 98 % (05/10 0630) ?Weight:  [86.6 kg] 86.6 kg (05/09 1748) ?  ? ?Intake/Output from previous day: ?05/09 0701 - 05/10 0700 ?In: 790.8 [I.V.:790.8] ?Out: 0  ?Intake/Output this shift: ?Total I/O ?In: 790.8 [I.V.:790.8] ?Out: 0  ? ?PE: ?General: pleasant, WD, overweight female who is laying in bed in NAD ?HEENT: head is normocephalic, atraumatic.  Sclera are noninjected.  Pupils equal and round.  Ears and nose without any masses or lesions.  Mouth is pink and moist ?Heart: regular, rate, and rhythm.  Normal s1,s2. No obvious murmurs, gallops, or rubs noted.  Palpable radial pulses bilaterally ?Lungs: CTAB, no wheezes, rhonchi, or rales noted.  Respiratory effort nonlabored ?Abd: soft, NT, ND, +BS, no masses, hernias, or organomegaly ?MS: RLE in splint, R toes WWP and NVI; LUE mildly edematous with some ecchymosis at shoulder, L grip 3/5 ?Skin: abrasion to L knee and partially examined at L shoulder/back ?Neuro: Cranial nerves 2-12 grossly intact, FC and speech clear ?Psych: A&Ox4 with an appropriate affect.  ? ? ?Lab Results:  ?Recent Labs  ?  12/27/21 ?1746 12/27/21 ?[id number]  ?WBC 9.1  --   ?HGB 14.3 14.3  ?HCT 40.6 42.0  ?PLT 194  --   ? ?BMET ?Recent Labs  ?  12/27/21 ?1746  12/27/21 ?[id number]  ?NA 140 139  ?K 4.4 4.3  ?CL 108 106  ?CO2 23  --   ?GLUCOSE 106* 106*  ?BUN 8 10  ?CREATININE 0.85 0.80  ?CALCIUM 9.1  --   ? ?PT/INR ?Recent Labs  ?  12/27/21 ?1746  ?LABPROT 13.1  ?INR 1.0  ? ?CMP  ?   ?Component Value Date/Time  ? NA 139 12/27/2021 1755  ? K 4.3 12/27/2021 1755  ? CL 106 12/27/2021 1755  ? CO2 23 12/27/2021 1746  ? GLUCOSE 106 (H) 12/27/2021 1755  ? BUN 10 12/27/2021 1755  ? CREATININE 0.80 12/27/2021 1755  ? CALCIUM 9.1 12/27/2021 1746  ? PROT 6.2 (L) 12/27/2021 1746  ? ALBUMIN 3.9 12/27/2021 1746  ? AST 32 12/27/2021 1746  ? ALT 25 12/27/2021 1746  ? ALKPHOS 62 12/27/2021 1746  ? BILITOT 0.6 12/27/2021 1746  ? GFRNONAA >60 12/27/2021 1746  ? ?Lipase  ?No results found for: LIPASE ? ? ? ? ?Studies/Results: ?DG Knee 2 Views Right ? ?Result Date: 12/27/2021 ?CLINICAL DATA:  Rule out fracture. EXAM: RIGHT KNEE - 1-2 VIEW COMPARISON:  None Available. FINDINGS: There is no acute fracture no dislocation. The bones are well mineralized. No significant arthritic changes. No joint effusion. The soft tissues are unremarkable IMPRESSION: Negative. Electronically Signed   By: Anner Crete M.D.   On: 12/27/2021 22:37  ? ?  DG Ankle Complete Right ? ?Result Date: 12/27/2021 ?CLINICAL DATA:  Fracture, postreduction. EXAM: RIGHT ANKLE - COMPLETE 3+ VIEW COMPARISON:  Radiograph earlier today. FINDINGS: Improved alignment of trimalleolar fracture postreduction. Improved mortise alignment with minimal residual displacement. Overlying splint material in place which limits osseous and soft tissue fine detail. IMPRESSION: Improved alignment of trimalleolar fracture postreduction. Electronically Signed   By: Keith Rake M.D.   On: 12/27/2021 19:24  ? ?CT Head Wo Contrast ? ?Result Date: 12/27/2021 ?CLINICAL DATA:  Head trauma, moderate to severe. Struck by a tree branch. Obvious deformity of the right ankle and left shoulder/arm. EXAM: CT HEAD WITHOUT CONTRAST TECHNIQUE: Contiguous axial images were  obtained from the base of the skull through the vertex without intravenous contrast. RADIATION DOSE REDUCTION: This exam was performed according to the departmental dose-optimization program which includes automated exposure control, adjustment of the mA and/or kV according to patient size and/or use of iterative reconstruction technique. COMPARISON:  None Available. FINDINGS: Brain: The ventricles are normal in size and configuration. The basilar cisterns are patent. No mass, mass effect, or midline shift. No acute intracranial hemorrhage is seen. No abnormal extra-axial fluid collection. Preservation of the normal cortical gray-white interface without CT evidence of an acute major vascular territorial cortical based infarction. Vascular: No hyperdense vessel or unexpected calcification. Skull: Normal. Negative for fracture or focal lesion. Sinuses/Orbits: The visualized orbits are unremarkable. Mild inferior left maxillary sinus mucosal opacification. The visualized mastoid air cells are clear. Other: None. IMPRESSION: No acute intracranial process. Mild inferior left maxillary sinus mucosal opacification. Electronically Signed   By: Yvonne Kendall M.D.   On: 12/27/2021 18:51  ? ?CT Cervical Spine Wo Contrast ? ?Result Date: 12/27/2021 ?CLINICAL DATA:  Trauma. EXAM: CT CERVICAL SPINE WITHOUT CONTRAST TECHNIQUE: Multidetector CT imaging of the cervical spine was performed without intravenous contrast. Multiplanar CT image reconstructions were also generated. RADIATION DOSE REDUCTION: This exam was performed according to the departmental dose-optimization program which includes automated exposure control, adjustment of the mA and/or kV according to patient size and/or use of iterative reconstruction technique. COMPARISON:  Cervical spine radiographs 11/29/2017 FINDINGS: Alignment: There is 1-2 mm grade 1 anterolisthesis of C5 on C6, 2 mm grade 1 anterolisthesis of C6 on C7, and 1-2 mm grade 1 anterolisthesis of C7 on  T1, the atlantodens interval is intact with mild-to-moderate degenerative change. The facet joints are appropriately aligned. Unchanged from prior 11/29/2017 radiographs. Skull base and vertebrae: Vertebral body heights are maintained. Minimal anterior C5-6 endplate spurring with tiny ossicle anterior to the C5-6 disc space. Minimal posterior C5-6: Mild-to-moderate diffuse C7-T1 and mild diffuse T1-2 disc space narrowing. No acute fracture is seen. Soft tissues and spinal canal: No prevertebral fluid or swelling. No visible canal hematoma. Disc levels: Multilevel degenerative changes including disc space narrowing, uncovertebral hypertrophy, and facet joint hypertrophy contribute to moderate right and mild left C5-6, mild-to-moderate right and mild left C6-7, and mild left C7-T1 neuroforaminal narrowing. No significant central canal stenosis. Upper chest: There is ground-glass opacification within the lung apices. Other: There is mild scattered air within the soft tissues of the predominantly posterolateral left superior hemithorax peripheral extrapleural space. Tiny foci of air seen within the left subclavian vein (axial series 6 images 94 through 98). This may be related to placement of an IV. Note is made that IV contrast was utilized for contemporaneous CT chest abdomen and pelvis. IMPRESSION:: IMPRESSION: 1. No acute fracture. 2. Multilevel degenerative disc and joint changes as above. 3. Tiny foci  of air superior to the posterolateral left lung. Correlating with contemporaneous CT of the chest, there appears to be a nondisplaced left second rib fracture, and there is a displaced left scapular fracture. These findings likely relate to a superior left pleural hematoma. These findings were discussed with Dr. Teressa Lower by Dr. Donavan Foil at time of interpretation of the contemporaneous CT of the chest. 4. Tiny foci of air within the nondependent aspect of the left subclavian vein, likely incidental from  placement of an IV. Electronically Signed   By: Yvonne Kendall M.D.   On: 12/27/2021 19:09  ? ?CT Ankle Right Wo Contrast ? ?Result Date: 12/27/2021 ?CLINICAL DATA:  Ankle trauma, fracture, xray done (Age >= 5y

## 2021-12-28 NOTE — Progress Notes (Addendum)
The service was rendered under my overall direction and control, and I was immediately available via phone/ pager or present on site. We will follow up xrays to see how to proceed. ? ?Myrene Galas, MD ?[facility] ?[phone] ?[phone] (p) ? ? ? ? ?  Orthopaedic Trauma Service Procedure  ? ?Dx: closed Right trimalleolar fracture dislocation  ? ?Clinician: Oletha Blend, PA-C ? ?Complications: none ? ?Anesthesia: R ankle intra-articular injection (3 cc 1% lidocaine, 2 cc 0.25% marcaine), 50 mcg fentanyl  ? ?Procedure  ? 61 year old sustained fracture-dislocation right ankle after being hit by a tree branch while at work at [employer]. Patient seen and evaluated in the ED. Reduction attempted by EDP earlier this am, however post reduction xray and CT shows persistent subluxation.  After evaluation by myself in the ED I felt and new splint and another attempt at reduction was advisable, it should be noted that pts skin shows significant trauma.  She already has an eschar medially were the medial tibia nearly came through the skin and there are some early pressure sores noted on dorsum of R foot. After full discussion of the procedure, the pt provided verbal consent, we proceeded with right ankle intra-articular injection. Patient received a total of 50?g of fentanyl ( 50 ?g approximately 5 minutes before removal of splint and reduction).  After adequate analgesia was achieved patient was brought to the right edge of the bed. Knee was brought up to about 45? of flexion. Very gentle longitudinal traction was applied to the ankle. Quigley maneuver was held while the orthotech applied the splint A posterior and stirrup splint was applied. Then a good mold was applied to the splint with a posterior lateral force directed over the distal tibia and an anteromedial force was directed over the calcaneus and distal fibula to help maintain reduction. Patient tolerated this well. Post-splinting x-rays will be  obtained.Patient had significant pain relief at the conclusion of the procedure.  ? ?Mearl Latin, PA-C ?[phone] (C) ?12/28/2021, 1:12 PM ? ?[facility] ?[address] ?[place] [address] ?[phone] (O) ?[phone] (F) ? Patient ID: Terri Drake, female   DOB: 1960-10-24, 61 y.o.   MRN: [id number] ? ?

## 2021-12-28 NOTE — Consult Note (Signed)
? ?     Orthopaedic Trauma Service (OTS) Consult  ? ?Patient ID: ?Terri Drake ?MRN: [id number] ?DOB/AGE: 1961/05/01 61 y.o. ? ? ?Reason for Consult: Right trimalleolar ankle fracture dislocation  ?Referring Physician: Victorino December, MD (ortho) ? ? ?HPI: Terri Drake is an 60 y.o. female who sustained injury  on 12/27/2021 while at work. Pt was hit by a tree branch which caused her to fall. Pt was brought to [facility] and due to her injury mechanism was admitted to trauma surgery.  Pt was seen and evaluated by dr. Stann Mainland with ortho and due to the complexity of her injury consultation was requested from the orthopedic trauma service as Dr. Stann Mainland asserted that the level of care the pt requires is outside the scope of his practice.  Pt was seen and evaluated the orthopaedic trauma service on 12/28/2021 in the ED.  ? ?Pt complains of L shoulder pain due to her scapula fracture and some tingling in her R ankle with continued R ankle pain.  ?Pain is somewhat relieved with pain medication.  Movement exacerbates her pain.  She has been trying to elevate her leg is much as possible. ? ? ?Past Medical History:  ?Diagnosis Date  ? Paroxysmal atrial fibrillation (HCC)   ? ? ?No past surgical history on file. ? ?No family history on file. ? ?Social History:  reports that she has never smoked. She has never used smokeless tobacco. She reports current alcohol use of about 5.0 - 10.0 standard drinks per week. She reports that she does not use drugs. ? ?Allergies:  ?Allergies  ?Allergen Reactions  ? Elemental Sulfur   ?  REACTION: hives  ? Gluten Meal Diarrhea  ? Sulfa Antibiotics Rash  ? ? ?Medications: I have reviewed the patient's current medications. ?Current Meds  ?Medication Sig  ? flecainide (TAMBOCOR) 50 MG tablet TAKE 1 TABLET(50 MG) BY MOUTH TWICE DAILY (Patient taking differently: Take 50 mg by mouth 2 (two) times daily. TAKE 1 TABLET(50 MG) BY MOUTH TWICE DAILY)  ? fluticasone (CUTIVATE) 0.05 % cream  Apply 1 application. topically 2 (two) times daily as needed.  ? latanoprost (XALATAN) 0.005 % ophthalmic solution Place 1 drop into both eyes at bedtime.  ? metoprolol succinate (TOPROL XL) 25 MG 24 hr tablet Take 0.5 tablets (12.5 mg total) by mouth daily.  ? ? ? ?Results for orders placed or performed during the hospital encounter of 12/27/21 (from the past 48 hour(s))  ?Resp Panel by RT-PCR (Flu A&B, Covid) Nasopharyngeal Swab     Status: None  ? Collection Time: 12/27/21  5:37 PM  ? Specimen: Nasopharyngeal Swab; Nasopharyngeal(NP) swabs in vial transport medium  ?Result Value Ref Range  ? SARS Coronavirus 2 by RT PCR NEGATIVE NEGATIVE  ?  Comment: (NOTE) ?SARS-CoV-2 target nucleic acids are NOT DETECTED. ? ?The SARS-CoV-2 RNA is generally detectable in upper respiratory ?specimens during the acute phase of infection. The lowest ?concentration of SARS-CoV-2 viral copies this assay can detect is ?138 copies/mL. A negative result does not preclude SARS-Cov-2 ?infection and should not be used as the sole basis for treatment or ?other patient management decisions. A negative result may occur with  ?improper specimen collection/handling, submission of specimen other ?than nasopharyngeal swab, presence of viral mutation(s) within the ?areas targeted by this assay, and inadequate number of viral ?copies(<138 copies/mL). A negative result must be combined with ?clinical observations, patient history, and epidemiological ?information. The expected result is Negative. ? ?Fact Sheet for Patients:  ?  EntrepreneurPulse.com.au ? ?Fact Sheet for Healthcare Providers:  ?IncredibleEmployment.be ? ?This test is no t yet approved or cleared by the [place] FDA and  ?has been authorized for detection and/or diagnosis of SARS-CoV-2 by ?FDA under an Emergency Use Authorization (EUA). This EUA will remain  ?in effect (meaning this test can be used) for the duration of the ?COVID-19 declaration  under Section 564(b)(1) of the Act, 21 ?U.S.C.section 360bbb-3(b)(1), unless the authorization is terminated  ?or revoked sooner.  ? ? ?  ? Influenza A by PCR NEGATIVE NEGATIVE  ? Influenza B by PCR NEGATIVE NEGATIVE  ?  Comment: (NOTE) ?The Xpert Xpress SARS-CoV-2/FLU/RSV plus assay is intended as an aid ?in the diagnosis of influenza from Nasopharyngeal swab specimens and ?should not be used as a sole basis for treatment. Nasal washings and ?aspirates are unacceptable for Xpert Xpress SARS-CoV-2/FLU/RSV ?testing. ? ?Fact Sheet for Patients: ?EntrepreneurPulse.com.au ? ?Fact Sheet for Healthcare Providers: ?IncredibleEmployment.be ? ?This test is not yet approved or cleared by the [place] FDA and ?has been authorized for detection and/or diagnosis of SARS-CoV-2 by ?FDA under an Emergency Use Authorization (EUA). This EUA will remain ?in effect (meaning this test can be used) for the duration of the ?COVID-19 declaration under Section 564(b)(1) of the Act, 21 U.S.C. ?section 360bbb-3(b)(1), unless the authorization is terminated or ?revoked. ? ?Performed at [facility], [place] [address], [place], [place] ?[address] ?  ?Comprehensive metabolic panel     Status: Abnormal  ? Collection Time: 12/27/21  5:46 PM  ?Result Value Ref Range  ? Sodium 140 135 - 145 mmol/L  ? Potassium 4.4 3.5 - 5.1 mmol/L  ? Chloride 108 98 - 111 mmol/L  ? CO2 23 22 - 32 mmol/L  ? Glucose, Bld 106 (H) 70 - 99 mg/dL  ?  Comment: Glucose reference range applies only to samples taken after fasting for at least 8 hours.  ? BUN 8 8 - 23 mg/dL  ? Creatinine, Ser 0.85 0.44 - 1.00 mg/dL  ? Calcium 9.1 8.9 - 10.3 mg/dL  ? Total Protein 6.2 (L) 6.5 - 8.1 g/dL  ? Albumin 3.9 3.5 - 5.0 g/dL  ? AST 32 15 - 41 U/L  ? ALT 25 0 - 44 U/L  ? Alkaline Phosphatase 62 38 - 126 U/L  ? Total Bilirubin 0.6 0.3 - 1.2 mg/dL  ? GFR, Estimated >60 >60 mL/min  ?  Comment: (NOTE) ?Calculated using the CKD-EPI Creatinine  Equation (2021) ?  ? Anion gap 9 5 - 15  ?  Comment: Performed at [facility], [place] [address] [place], [place] [address]  ?CBC     Status: Abnormal  ? Collection Time: 12/27/21  5:46 PM  ?Result Value Ref Range  ? WBC 9.1 4.0 - 10.5 K/uL  ? RBC 4.09 3.87 - 5.11 MIL/uL  ? Hemoglobin 14.3 12.0 - 15.0 g/dL  ? HCT 40.6 36.0 - 46.0 %  ? MCV 99.3 80.0 - 100.0 fL  ? MCH 35.0 (H) 26.0 - 34.0 pg  ? MCHC 35.2 30.0 - 36.0 g/dL  ? RDW 12.8 11.5 - 15.5 %  ? Platelets 194 150 - 400 K/uL  ? nRBC 0.0 0.0 - 0.2 %  ?  Comment: Performed at [facility], [place] [address], [place], [place] [address]  ?Ethanol     Status: None  ? Collection Time: 12/27/21  5:46 PM  ?Result Value Ref Range  ? Alcohol, Ethyl (B) <10 <10 mg/dL  ?  Comment: (NOTE) ?Lowest detectable limit for serum alcohol is 10 mg/dL. ? ?For medical purposes only. ?Performed at [facility], [place] [address] [place], [place] ?[address] ?  ?Lactic acid, plasma     Status: None  ? Collection Time: 12/27/21  5:46 PM  ?Result Value Ref Range  ? Lactic Acid, Venous 1.7 0.5 - 1.9 mmol/L  ?  Comment: Performed at [facility], [place] [address] [place], [place] [address]  ?Protime-INR     Status: None  ? Collection Time: 12/27/21  5:46 PM  ?Result Value Ref Range  ? Prothrombin Time 13.1 11.4 - 15.2 seconds  ? INR 1.0 0.8 - 1.2  ?  Comment: (NOTE) ?INR goal varies based on device and disease states. ?Performed at [facility], [place] [address], [place], [place] ?[address] ?  ?Sample to Blood Bank     Status: None  ? Collection Time: 12/27/21  5:46 PM  ?Result Value Ref Range  ? Blood Bank Specimen SAMPLE AVAILABLE FOR TESTING   ? Sample Expiration    ?  12/28/2021,2359 ?Performed at [facility], [place] [address] [place], [place] [address] ?  ?I-Stat Chem 8, ED     Status: Abnormal  ? Collection Time: 12/27/21  5:55 PM  ?Result Value Ref Range  ? Sodium 139 135 - 145 mmol/L  ? Potassium 4.3 3.5 - 5.1 mmol/L  ? Chloride 106 98 - 111 mmol/L  ? BUN 10 8  - 23 mg/dL  ? Creatinine, Ser 0.80 0.44 - 1.00 mg/dL  ? Glucose, Bld 106 (H) 70 - 99 mg/dL  ?  Comment: Glucose reference range applies only to samples taken after fasting for at least 8 hours.  ? Calcium,

## 2021-12-28 NOTE — Progress Notes (Signed)
OT Cancellation Note ? ?Patient Details ?Name: Terri Drake ?MRN: [id number] ?DOB: 1960-12-20 ? ? ?Cancelled Treatment:    Reason Eval/Treat Not Completed: Medical issues which prohibited therapy. ? ?Apurva Reily D Allister Lessley ?12/28/2021, 4:20 PM ?

## 2021-12-28 NOTE — Consult Note (Signed)
?  Chief Complaint  ? ?Back and leg pain after tree branch fell ? ?History of Present Illness  ?Terri Drake is a 61 y.o. female  admitted to the emergency department after she was loading a lawnmower onto the back of a truck when an overlying large tree branch fell on her.  She complains of right-sided leg pain related to an ankle fracture as well as some abrasions in her left knee.  She does also complain of pain in her low back noted when they tried to sit her up.  She does not note any weakness of the extremities.  No paresthesias of the extremities. ? ?Past Medical History  ? ?Past Medical History:  ?Diagnosis Date  ? Paroxysmal atrial fibrillation (HCC)   ? ? ?Past Surgical History  ?No past surgical history on file. ? ?Social History  ? ?Social History  ? ?Tobacco Use  ? Smoking status: Never  ? Smokeless tobacco: Never  ? Tobacco comments:  ?  Never smoke 11/04/21  ?Substance Use Topics  ? Alcohol use: Yes  ?  Alcohol/week: 5.0 - 10.0 standard drinks  ?  Types: 5 - 10 Glasses of wine per week  ?  Comment: 1-2 glasses nightly 08/05/2021  ? Drug use: Never  ? ? ?Medications  ? ?Prior to Admission medications   ?Medication Sig Start Date End Date Taking? Authorizing Provider  ?flecainide (TAMBOCOR) 50 MG tablet TAKE 1 TABLET(50 MG) BY MOUTH TWICE DAILY ?Patient taking differently: Take 50 mg by mouth 2 (two) times daily. TAKE 1 TABLET(50 MG) BY MOUTH TWICE DAILY 11/04/21  Yes Fenton, Clint R, PA  ?fluticasone (CUTIVATE) 0.05 % cream Apply 1 application. topically 2 (two) times daily as needed. 08/29/21  Yes [provider]  ?latanoprost (XALATAN) 0.005 % ophthalmic solution Place 1 drop into both eyes at bedtime. 01/07/21  Yes [provider]  ?metoprolol succinate (TOPROL XL) 25 MG 24 hr tablet Take 0.5 tablets (12.5 mg total) by mouth daily. 11/04/21  Yes Fenton, Clint R, PA  ? ? ?Allergies  ? ?Allergies  ?Allergen Reactions  ? Elemental Sulfur   ?  REACTION: hives  ? Gluten Meal Diarrhea  ?  Sulfa Antibiotics Rash  ? ? ?Review of Systems  ?ROS ? ?Neurologic Exam  ?Awake, alert, oriented ?Memory and concentration grossly intact ?Speech fluent, appropriate ?CN grossly intact ?Motor exam: ?Upper Extremities Deltoid Bicep Tricep Grip  ?Right 5/5 5/5 5/5 5/5  ?Left 5/5 5/5 5/5 5/5  ? ?Lower Extremities IP Quad PF DF EHL  ?Right 5/5 5/5 5/5 5/5 5/5  ?Left 5/5 5/5 5/5 5/5 5/5  ? ?Sensation grossly intact to LT ? ?Imaging  ?  CT scan of the thoracic spine was personally reviewed.  This demonstrates normal alignment.  There may be minor superior endplate fractures at T11 and T12 although this does not appear to be definitive. ? ?Impression  ?- 61 y.o. female   Presenting after a tree branch fell on her, appears to be neurologically intact.  CT thoracic spine reveals presence of possible T11 and T12 superior endplate fractures.  These are certainly not unstable and would not necessarily require bracing  except for comfort. ? ?Plan  ?-   Can utilize a lumbar corset for comfort when the patient is up ?-  can follow up with me on a prn basis in the outpatient clinic ? ?Lisbeth Renshaw, MD ?[facility]  ? ?

## 2021-12-28 NOTE — Progress Notes (Signed)
Orthopaedic Trauma Service ? ?Consult requested by Dr. Aundria Rud due to the complexity of pts right ankle fracture  ?Pt seen and evaluated  ?Full note to follow  ? ?Comminuted R trimalleolar ankle fracture dislocation  ? ?OR tomorrow for ORIF vs ex fix depending on condition soft tissue (degree of swelling, presence of fracture blisters, etc) ? ?NPO after MN ?Aggressive ice and elevation,  elevate ankle/toes above heart  ?NWB R leg  ? ?Mearl Latin, PA-C ?[phone] (C) ?12/28/2021, 11:37 AM ? ?[facility] ?[address] ?[place] [address] ?[phone]) ?[phone] (F) ? ?  ? ? ?Patient ID: Terri Drake, female   DOB: 1961/04/01, 61 y.o.   MRN: [id number] ? ?

## 2021-12-28 NOTE — Plan of Care (Signed)
  Problem: Elimination: Goal: Will not experience complications related to bowel motility Outcome: Progressing   Problem: Safety: Goal: Ability to remain free from injury will improve Outcome: Progressing   Problem: Skin Integrity: Goal: Risk for impaired skin integrity will decrease Outcome: Progressing   

## 2021-12-28 NOTE — ED Notes (Signed)
Trauma Event Note ? ? ? ? ?TRN rounding on pt- ?Attempted to In and Out cath pt x 2 without success. Primary RN informed. Assisted in transferring pt to hospital bed- covered back abrasions with xeroform gauze and ABD pads. Also has Lido patch on left flank area for rib pain. Mepalex pad placed on coccyx. Wiped with CHG wipes.  ? ?Last imported Vital Signs ?BP 134/87   Pulse 60   Temp 97.9 ?F (36.6 ?C) (Oral)   Resp 17   Ht 5\' 7"  (1.702 m)   Wt 191 lb (86.6 kg)   SpO2 97%   BMI 29.91 kg/m?  ? ?Trending CBC ?Recent Labs  ?  12/27/21 ?1746 12/27/21 ?1755 12/28/21 ?0823  ?WBC 9.1  --  7.1  ?HGB 14.3 14.3 12.1  ?HCT 40.6 42.0 34.9*  ?PLT 194  --  147*  ? ? ?Trending Coag's ?Recent Labs  ?  12/27/21 ?1746  ?INR 1.0  ? ? ?Trending BMET ?Recent Labs  ?  12/27/21 ?1746 12/27/21 ?1755 12/28/21 ?0823  ?NA 140 139 136  ?K 4.4 4.3 4.2  ?CL 108 106 110  ?CO2 23  --  22  ?BUN 8 10 9   ?CREATININE 0.85 0.80 0.74  ?GLUCOSE 106* 106* 121*  ? ? ? ? ?02/27/22 Terri Drake  ?Trauma Response RN ? ?Please call TRN at [phone] for further assistance. ? ? ?  ?

## 2021-12-28 NOTE — Progress Notes (Signed)
PT Cancellation Note ? ?Patient Details ?Name: Terri Drake ?MRN: [id number] ?DOB: [birth year] ? ? ?Cancelled Treatment:    Reason Eval/Treat Not Completed: Medical issues which prohibited therapy Currently awaiting surgery for ankle fx. Will follow up as schedule allows following surgery.  ? ?Farley Ly, PT, DPT  ?Acute Rehabilitation Services  ?Pager: [phone] ?Office: [phone] ? ?Terri Drake ?12/28/2021, 2:11 PM ?

## 2021-12-28 NOTE — Progress Notes (Signed)
Orthopedic Tech Progress Note ?Patient Details:  ?Terri Drake ?1960/09/27 ?[id number] ? ?Ortho Devices ?Type of Ortho Device: Shoulder immobilizer, Lumbar corsett ?Ortho Device/Splint Location: RLE ?Ortho Device/Splint Interventions: Ordered ?  ?Post Interventions ?Patient Tolerated: Well ?Instructions Provided: Adjustment of device, Care of device ?Patient requested that the sling and LSO not be placed on her until the morning, as she is already comfortable in bed. Expressed concerns for the LSO and it's tightness due to her rib fracture. Instructions for both orthotic devices provided. ? ?[place] ?12/28/2021, 9:34 PM ? ?

## 2021-12-29 ENCOUNTER — Other Ambulatory Visit: Payer: Self-pay

## 2021-12-29 ENCOUNTER — Inpatient Hospital Stay (HOSPITAL_COMMUNITY): Payer: No Typology Code available for payment source | Admitting: Anesthesiology

## 2021-12-29 ENCOUNTER — Inpatient Hospital Stay (HOSPITAL_COMMUNITY): Payer: No Typology Code available for payment source

## 2021-12-29 ENCOUNTER — Encounter (HOSPITAL_COMMUNITY): Payer: Self-pay

## 2021-12-29 ENCOUNTER — Encounter (HOSPITAL_COMMUNITY): Admission: EM | Disposition: A | Discharge status: Rehabilitation Facility | Payer: Self-pay | Source: Home / Self Care

## 2021-12-29 HISTORY — PX: ORIF ANKLE FRACTURE: SHX5408

## 2021-12-29 SURGERY — OPEN REDUCTION INTERNAL FIXATION (ORIF) ANKLE FRACTURE
Anesthesia: General | Site: Ankle | Laterality: Right

## 2021-12-29 MED ORDER — PROPOFOL 10 MG/ML IV BOLUS
INTRAVENOUS | Status: DC | PRN
  Administered 2021-12-29: 150 mg via INTRAVENOUS

## 2021-12-29 MED ORDER — ROCURONIUM BROMIDE 10 MG/ML (PF) SYRINGE
PREFILLED_SYRINGE | INTRAVENOUS | Status: AC
  Filled 2021-12-29: qty 10

## 2021-12-29 MED ORDER — FENTANYL CITRATE (PF) 250 MCG/5ML IJ SOLN
INTRAMUSCULAR | Status: DC | PRN
Start: 2021-12-29 — End: 2021-12-29
  Administered 2021-12-29 (×2): 50 ug via INTRAVENOUS

## 2021-12-29 MED ORDER — LIDOCAINE 2% (20 MG/ML) 5 ML SYRINGE
INTRAMUSCULAR | Status: AC
  Filled 2021-12-29: qty 5

## 2021-12-29 MED ORDER — 0.9 % SODIUM CHLORIDE (POUR BTL) OPTIME
TOPICAL | Status: DC | PRN
  Administered 2021-12-29: 1000 mL

## 2021-12-29 MED ORDER — DEXAMETHASONE SODIUM PHOSPHATE 10 MG/ML IJ SOLN
INTRAMUSCULAR | Status: DC | PRN
  Administered 2021-12-29: 10 mg via INTRAVENOUS

## 2021-12-29 MED ORDER — ROCURONIUM BROMIDE 10 MG/ML (PF) SYRINGE
PREFILLED_SYRINGE | INTRAVENOUS | Status: DC | PRN
  Administered 2021-12-29: 70 mg via INTRAVENOUS

## 2021-12-29 MED ORDER — OXYCODONE HCL 5 MG/5ML PO SOLN: 5.0000 mg | Freq: Once | ORAL | Status: DC | PRN

## 2021-12-29 MED ORDER — ONDANSETRON HCL 4 MG/2ML IJ SOLN
INTRAMUSCULAR | Status: DC | PRN
  Administered 2021-12-29: 4 mg via INTRAVENOUS

## 2021-12-29 MED ORDER — CHLORHEXIDINE GLUCONATE 0.12 % MT SOLN: 15.0000 mL | Freq: Once | OROMUCOSAL | Status: AC

## 2021-12-29 MED ORDER — FENTANYL CITRATE (PF) 100 MCG/2ML IJ SOLN: 25.0000 ug | INTRAMUSCULAR | Status: DC | PRN

## 2021-12-29 MED ORDER — BETHANECHOL CHLORIDE 10 MG PO TABS
10.0000 mg | ORAL_TABLET | Freq: Three times a day (TID) | ORAL | Status: DC
  Administered 2021-12-29 – 2021-12-30 (×3): 10 mg via ORAL
  Filled 2021-12-29 (×3): qty 1

## 2021-12-29 MED ORDER — EPHEDRINE 5 MG/ML INJ
INTRAVENOUS | Status: AC
  Filled 2021-12-29: qty 5

## 2021-12-29 MED ORDER — ONDANSETRON HCL 4 MG/2ML IJ SOLN: 4.0000 mg | Freq: Four times a day (QID) | INTRAMUSCULAR | Status: DC | PRN

## 2021-12-29 MED ORDER — ROPIVACAINE HCL 5 MG/ML IJ SOLN: INTRAMUSCULAR | Status: DC | PRN

## 2021-12-29 MED ORDER — CEFAZOLIN SODIUM-DEXTROSE 2-4 GM/100ML-% IV SOLN
2.0000 g | Freq: Three times a day (TID) | INTRAVENOUS | Status: AC
  Administered 2021-12-29 – 2021-12-30 (×3): 2 g via INTRAVENOUS
  Filled 2021-12-29 (×3): qty 100

## 2021-12-29 MED ORDER — LACTATED RINGERS IV SOLN: INTRAVENOUS | Status: DC

## 2021-12-29 MED ORDER — SUGAMMADEX SODIUM 200 MG/2ML IV SOLN
INTRAVENOUS | Status: DC | PRN
  Administered 2021-12-29: 200 mg via INTRAVENOUS

## 2021-12-29 MED ORDER — ORAL CARE MOUTH RINSE: 15.0000 mL | Freq: Once | OROMUCOSAL | Status: AC

## 2021-12-29 MED ORDER — OXYCODONE HCL 5 MG PO TABS: 5.0000 mg | ORAL_TABLET | Freq: Once | ORAL | Status: DC | PRN

## 2021-12-29 MED ORDER — ONDANSETRON HCL 4 MG/2ML IJ SOLN
INTRAMUSCULAR | Status: AC
  Filled 2021-12-29: qty 2

## 2021-12-29 MED ORDER — EPHEDRINE SULFATE-NACL 50-0.9 MG/10ML-% IV SOSY
PREFILLED_SYRINGE | INTRAVENOUS | Status: DC | PRN
  Administered 2021-12-29: 10 mg via INTRAVENOUS

## 2021-12-29 MED ORDER — FENTANYL CITRATE (PF) 250 MCG/5ML IJ SOLN
INTRAMUSCULAR | Status: AC
  Filled 2021-12-29: qty 5

## 2021-12-29 MED ORDER — CHLORHEXIDINE GLUCONATE 0.12 % MT SOLN
OROMUCOSAL | Status: AC
  Administered 2021-12-29: 15 mL via OROMUCOSAL
  Filled 2021-12-29: qty 15

## 2021-12-29 MED ORDER — ROPIVACAINE HCL 5 MG/ML IJ SOLN
INTRAMUSCULAR | Status: DC | PRN
  Administered 2021-12-29: 25 mL via PERINEURAL
  Administered 2021-12-29: 15 mL via PERINEURAL

## 2021-12-29 MED ORDER — MIDAZOLAM HCL 2 MG/2ML IJ SOLN
INTRAMUSCULAR | Status: AC
  Filled 2021-12-29: qty 2

## 2021-12-29 MED ORDER — MIDAZOLAM HCL 5 MG/5ML IJ SOLN
INTRAMUSCULAR | Status: DC | PRN
  Administered 2021-12-29: 2 mg via INTRAVENOUS

## 2021-12-29 MED ORDER — DEXAMETHASONE SODIUM PHOSPHATE 10 MG/ML IJ SOLN
INTRAMUSCULAR | Status: AC
  Filled 2021-12-29: qty 1

## 2021-12-29 MED ORDER — PROPOFOL 10 MG/ML IV BOLUS
INTRAVENOUS | Status: AC
  Filled 2021-12-29: qty 20

## 2021-12-29 SURGICAL SUPPLY — 73 items
BAG COUNTER SPONGE SURGICOUNT (BAG) ×3 IMPLANT
BAG SPNG CNTER NS LX DISP (BAG) ×1
BANDAGE ESMARK 6X9 LF (GAUZE/BANDAGES/DRESSINGS) ×2 IMPLANT
BIT DRILL 2.4X140 LONG SOLID (BIT) ×1 IMPLANT
BIT DRILL SOLID 2.0 X 110MM (DRILL) IMPLANT
BNDG CMPR 9X6 STRL LF SNTH (GAUZE/BANDAGES/DRESSINGS) ×1
BNDG ELASTIC 4X5.8 VLCR STR LF (GAUZE/BANDAGES/DRESSINGS) ×1 IMPLANT
BNDG ELASTIC 6X5.8 VLCR STR LF (GAUZE/BANDAGES/DRESSINGS) ×1 IMPLANT
BNDG ESMARK 6X9 LF (GAUZE/BANDAGES/DRESSINGS) ×2
BNDG GAUZE ELAST 4 BULKY (GAUZE/BANDAGES/DRESSINGS) ×5 IMPLANT
BRUSH SCRUB EZ PLAIN DRY (MISCELLANEOUS) ×5 IMPLANT
COVER MAYO STAND STRL (DRAPES) ×3 IMPLANT
COVER SURGICAL LIGHT HANDLE (MISCELLANEOUS) ×3 IMPLANT
CUFF TOURN SGL QUICK 34 (TOURNIQUET CUFF)
CUFF TRNQT CYL 34X4.125X (TOURNIQUET CUFF) ×2 IMPLANT
DRAPE C-ARM 42X72 X-RAY (DRAPES) ×3 IMPLANT
DRAPE C-ARMOR (DRAPES) ×3 IMPLANT
DRAPE HALF SHEET 40X57 (DRAPES) ×3 IMPLANT
DRAPE U-SHAPE 47X51 STRL (DRAPES) ×3 IMPLANT
DRAPE U-SHAPE 48X52 POLY STRL (PACKS) ×1 IMPLANT
DRILL SOLID 2.0 X 110MM (DRILL) ×2
DRSG EMULSION OIL 3X3 NADH (GAUZE/BANDAGES/DRESSINGS) IMPLANT
DRSG MEPITEL 4X7.2 (GAUZE/BANDAGES/DRESSINGS) ×1 IMPLANT
DRSG PAD ABDOMINAL 8X10 ST (GAUZE/BANDAGES/DRESSINGS) ×1 IMPLANT
ELECT REM PT RETURN 9FT ADLT (ELECTROSURGICAL) ×2
ELECTRODE REM PT RTRN 9FT ADLT (ELECTROSURGICAL) ×2 IMPLANT
FIXATION ZIPTIGHT ANKLE SNDSMS (Ankle) IMPLANT
GAUZE SPONGE 4X4 12PLY STRL (GAUZE/BANDAGES/DRESSINGS) ×5 IMPLANT
GLOVE BIO SURGEON STRL SZ7.5 (GLOVE) ×3 IMPLANT
GLOVE BIO SURGEON STRL SZ8 (GLOVE) ×3 IMPLANT
GLOVE BIOGEL PI IND STRL 7.5 (GLOVE) ×2 IMPLANT
GLOVE BIOGEL PI IND STRL 8 (GLOVE) ×2 IMPLANT
GLOVE BIOGEL PI INDICATOR 7.5 (GLOVE) ×1
GLOVE BIOGEL PI INDICATOR 8 (GLOVE) ×1
GLOVE SURG ORTHO LTX SZ7.5 (GLOVE) ×6 IMPLANT
GOWN STRL REUS W/ TWL LRG LVL3 (GOWN DISPOSABLE) ×4 IMPLANT
GOWN STRL REUS W/ TWL XL LVL3 (GOWN DISPOSABLE) ×2 IMPLANT
GOWN STRL REUS W/TWL LRG LVL3 (GOWN DISPOSABLE) ×4
GOWN STRL REUS W/TWL XL LVL3 (GOWN DISPOSABLE) ×2
KIT BASIN OR (CUSTOM PROCEDURE TRAY) ×3 IMPLANT
KIT TURNOVER KIT B (KITS) ×3 IMPLANT
MANIFOLD NEPTUNE II (INSTRUMENTS) ×2 IMPLANT
NDL HYPO 21X1.5 SAFETY (NEEDLE) IMPLANT
NEEDLE HYPO 21X1.5 SAFETY (NEEDLE) IMPLANT
NS IRRIG 1000ML POUR BTL (IV SOLUTION) ×3 IMPLANT
PACK GENERAL/GYN (CUSTOM PROCEDURE TRAY) ×3 IMPLANT
PACK ORTHO EXTREMITY (CUSTOM PROCEDURE TRAY) ×3 IMPLANT
PAD ARMBOARD 7.5X6 YLW CONV (MISCELLANEOUS) ×6 IMPLANT
PAD CAST 4YDX4 CTTN HI CHSV (CAST SUPPLIES) IMPLANT
PADDING CAST COTTON 4X4 STRL (CAST SUPPLIES) ×2
PADDING CAST COTTON 6X4 STRL (CAST SUPPLIES) ×1 IMPLANT
PLATE FIB 11H ANATOMICAL RT (Plate) ×1 IMPLANT
SCREW CANN MH LNG 4.0X28 (Screw) ×2 IMPLANT
SCREW LOCK PLATE R3 3.5X12 (Screw) ×2 IMPLANT
SCREW LOCK PLATE R3 3.5X16 (Screw) ×2 IMPLANT
SCREW NLOCK PLATE RECON 3.5X10 (Screw) ×1 IMPLANT
SCREW NON LOCK 3.5X18 (Screw) ×1 IMPLANT
SCREW NON LOCKING 3.5X12 (Screw) ×1 IMPLANT
SCREW NON LOCKING 3.5X14 (Screw) ×1 IMPLANT
SPONGE T-LAP 18X18 ~~LOC~~+RFID (SPONGE) ×3 IMPLANT
STAPLER VISISTAT 35W (STAPLE) ×1 IMPLANT
SUCTION FRAZIER HANDLE 10FR (MISCELLANEOUS) ×2
SUCTION TUBE FRAZIER 10FR DISP (MISCELLANEOUS) ×2 IMPLANT
SUT ETHILON 2 0 FS 18 (SUTURE) ×6 IMPLANT
SUT PDS AB 2-0 CT1 27 (SUTURE) IMPLANT
SUT VIC AB 2-0 CT1 27 (SUTURE) ×4
SUT VIC AB 2-0 CT1 TAPERPNT 27 (SUTURE) ×4 IMPLANT
TOWEL GREEN STERILE (TOWEL DISPOSABLE) ×6 IMPLANT
TOWEL GREEN STERILE FF (TOWEL DISPOSABLE) ×3 IMPLANT
TUBE CONNECTING 12X1/4 (SUCTIONS) ×3 IMPLANT
UNDERPAD 30X36 HEAVY ABSORB (UNDERPADS AND DIAPERS) ×3 IMPLANT
WATER STERILE IRR 1000ML POUR (IV SOLUTION) ×3 IMPLANT
ZIPTIGHT ANKLE SYNODESMOSS FIX (Ankle) ×2 IMPLANT

## 2021-12-29 NOTE — TOC Initial Note (Signed)
Transition of Care (TOC) - Initial/Assessment Note  ? ? ?Patient Details  ?Name: Terri Drake ?MRN: [id number] ?Date of Birth: [birth year] ? ?Transition of Care [facility]) CM/SW Contact:    ?Ella Bodo, RN ?Phone Number: ?12/29/2021, 3:50 PM ? ?Clinical Narrative:                 ?Patient admitted on 12/27/2021 after being struck by a tree branch.  She sustained T11/12 endplate fractures, right ankle fractures, multiple rib fractures, left scapular fracture, and left pneumothorax.  Patient to OR today for orthopedic repairs; PT/OT evaluations pending.  Met with patient and spouse: Case will be Worker's Compensation, and WC case manager has reached out to me.  Case manager is Bernadette Hoit, phone: [phone], fax: [phone].  Patient gives permission for all clinical information to be shared with [facility]; clinical information faxed as requested. ? ?Expected Discharge Plan: [place] ?Barriers to Discharge: Continued Medical Work up ? ? ?Patient Goals and CMS Choice ?Patient states their goals for this hospitalization and ongoing recovery are:: to go home ?  ?  ? ?Expected Discharge Plan and Services ?Expected Discharge Plan: [place] ?  ?Discharge Planning Services: CM Consult ?  ?Living arrangements for the past 2 months: [place] ?                ?  ?  ?  ?  ?  ?  ?  ?  ?  ?  ? ?Prior Living Arrangements/Services ?Living arrangements for the past 2 months: [place] ?Lives with:: Spouse ?Patient language and need for interpreter reviewed:: Yes ?Do you feel safe going back to the place where you live?: Yes      ?Need for Family Participation in Patient Care: Yes (Comment) ?Care giver support system in place?: Yes (comment) ?  ?Criminal Activity/Legal Involvement Pertinent to Current Situation/Hospitalization: No - Comment as needed ? ?Activities of Daily Living ?  ?  ? ?Permission Sought/Granted ?  ?  ? Share Information with NAME: Beth Hagler ? Permission granted to share info w  AGENCY: Worker's compensation ? Permission granted to share info w Relationship: Case Manager ? Permission granted to share info w Contact Information: [phone] ? ?Emotional Assessment ?Appearance:: Appears stated age ?Attitude/Demeanor/Rapport: Engaged ?Affect (typically observed): Accepting ?Orientation: : Oriented to Self, Oriented to Place, Oriented to  Time, Oriented to Situation ?  ?  ? ?Admission diagnosis:  Scapula fracture [S42.109A] ?Closed trimalleolar fracture of right ankle, initial encounter [S82.851A] ?Closed fracture of multiple ribs of left side, initial encounter [S22.42XA] ?Closed fracture of left scapula, unspecified part of scapula, initial encounter [S42.102A] ?Patient Active Problem List  ? Diagnosis Date Noted  ? Scapula fracture 12/27/2021  ? Persistent atrial fibrillation ([place]) 04/12/2021  ? Paroxysmal atrial fibrillation ([place]) 01/19/2021  ? ?PCP:  Aretta Nip, MD ?Pharmacy:   ?[facility] [place] ?[place] ?[place] [address] ?Phone: [phone] Fax: [phone] ? ? ? ? ?Social Determinants of Health (SDOH) Interventions ?  ? ?Readmission Risk Interventions ?   ? View : No data to display.  ?  ?  ?  ? ?Reinaldo Raddle, RN, BSN  ?Trauma/Neuro ICU Case Manager ?[phone] ? ? ?

## 2021-12-29 NOTE — Progress Notes (Signed)
? ? ?Day of Surgery  ?Subjective: ?CC: ?Seen in PACU ?S/p OR w/ Ortho. Appears she had an ORIF of her R ankle ? ?Waking up. Some pain of her L shoulder/scapula and L ribs but otherwise no pain right now. Notes numbness of her L 4th -5th digits but no hand pain or LUE pain aside from her shoulder. Discussed that Dr. 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 No abdominal pain or other extremity pain. Using IS and pulling 750.  ? ?Urinary retention overnight s/p foley placement ? ?Reports this happened at work, [employer]. She was loading a mower when a tree branch fell on her. She lives at home with her husband in a 2 story home but all her needs (bedroom, kitchen, bathroom etc) are on the first floor. There are 3 stairs entering the front of her home, 4 stairs into the back of her home.  ? ?Objective: ?Vital signs in last 24 hours: ?Temp:  [97.2 ?F (36.2 ?C)-98.9 ?F (37.2 ?C)] 97.2 ?F (36.2 ?C) (05/11 1119) ?Pulse Rate:  [58-78] 66 (05/11 1134) ?Resp:  [13-20] 13 (05/11 1134) ?BP: (119-167)/(59-87) 128/63 (05/11 1134) ?SpO2:  [92 %-100 %] 99 % (05/11 1134) ?Last BM Date : 12/27/21 ? ?Intake/Output from previous day: ?05/10 0701 - 05/11 0700 ?In: -  ?Out: 1300 [Urine:1300] ?Intake/Output this shift: ?Total I/O ?In: 1000 [I.V.:1000] ?Out: 225 [Urine:200; Blood:25] ? ?PE: ?Gen:  Alert, NAD, pleasant ?HEENT: EOM's intact, pupils equal and round ?Card:  RRR ?Pulm:  CTAB, no W/R/R, effort normal. On o2 in PACU. ?Abd: Soft, ND, NT, +BS ?GU: Foley in place w/ straw colored urine ?Ext: Splint to RLE. Cap refill < 2 seconds to exposed digits. Able to wiggle toes. No LLE edema. No obvious deformity of the BUE's. Reports SILT to all digits of the left hand but feels slightly decreased for the 4th and 5th digits. Sensation appears intact to light touch proximal to wrist. She is able able to wiggle fingers.   ?Psych: A&Ox3  ?Skin: Abrasions dressed. Skin warm and dry ? ?Lab Results:  ?Recent Labs  ?  12/27/21 ?[id number] 12/27/21 ?[id number] 12/28/21 ?0823  ?WBC 9.1  --  7.1  ?HGB 14.3 14.3 12.1  ?HCT 40.6 42.0 34.9*  ?PLT 194  --  147*  ? ?BMET ?Recent Labs  ?  12/27/21 ?1746 12/27/21 ?1755 12/28/21 ?0823  ?NA 140 139 136  ?K 4.4 4.3 4.2  ?CL 108 106 110  ?CO2 23  --  22  ?GLUCOSE 106* 106* 121*  ?BUN 8 10 9   ?CREATININE 0.85 0.80 0.74  ?CALCIUM 9.1  --  8.3*  ? ?PT/INR ?Recent Labs  ?  12/27/21 ?1746  ?LABPROT 13.1  ?INR 1.0  ? ?CMP  ?   ?Component Value Date/Time  ? NA 136 12/28/2021 0823  ? K 4.2 12/28/2021 0823  ? CL 110 12/28/2021 0823  ? CO2 22 12/28/2021 0823  ? GLUCOSE 121 (H) 12/28/2021 [id number]  ? BUN 9 12/28/2021 0823  ? CREATININE 0.74 12/28/2021 0823  ? CALCIUM 8.3 (L) 12/28/2021 [id number]  ? PROT 6.2 (L) 12/27/2021 1746  ? ALBUMIN 3.9 12/27/2021 1746  ? AST 32 12/27/2021 1746  ? ALT 25 12/27/2021 1746  ? ALKPHOS 62 12/27/2021 1746  ? BILITOT 0.6 12/27/2021 1746  ? GFRNONAA >60 12/28/2021 0823  ? ?Lipase  ?No results found for: LIPASE ? ?Studies/Results: ?DG Knee 2 Views Right ? ?  Result Date: 12/27/2021 ?CLINICAL DATA:  Rule out fracture. EXAM: RIGHT KNEE - 1-2 VIEW COMPARISON:  None Available. FINDINGS: There is no acute fracture no dislocation. The bones are well mineralized. No significant arthritic changes. No joint effusion. The soft tissues are unremarkable IMPRESSION: Negative. Electronically Signed   By: Anner Crete M.D.   On: 12/27/2021 22:37  ? ?DG Ankle Complete Right ? ?Result Date: 12/28/2021 ?CLINICAL DATA:  Reduction of ankle fracture dislocation. EXAM: RIGHT ANKLE - COMPLETE 3+ VIEW COMPARISON:  Radiographs 12/27/2021 FINDINGS: Limited bony detail due to the overlying plaster splint. Reduction of tibiotalar dislocation. The ankle mortise is normal. Good position of the medial malleolus fracture. Improved position of the posterior tibial fracture and the distal fibular shaft fracture. IMPRESSION: Reduction of  tibiotalar dislocation and improved alignment of the fractures. Electronically Signed   By: Marijo Sanes M.D.   On: 12/28/2021 14:03  ? ?DG Ankle Complete Right ? ?Result Date: 12/27/2021 ?CLINICAL DATA:  Fracture, postreduction. EXAM: RIGHT ANKLE - COMPLETE 3+ VIEW COMPARISON:  Radiograph earlier today. FINDINGS: Improved alignment of trimalleolar fracture postreduction. Improved mortise alignment with minimal residual displacement. Overlying splint material in place which limits osseous and soft tissue fine detail. IMPRESSION: Improved alignment of trimalleolar fracture postreduction. Electronically Signed   By: Keith Rake M.D.   On: 12/27/2021 19:24  ? ?CT Head Wo Contrast ? ?Result Date: 12/27/2021 ?CLINICAL DATA:  Head trauma, moderate to severe. Struck by a tree branch. Obvious deformity of the right ankle and left shoulder/arm. EXAM: CT HEAD WITHOUT CONTRAST TECHNIQUE: Contiguous axial images were obtained from the base of the skull through the vertex without intravenous contrast. RADIATION DOSE REDUCTION: This exam was performed according to the departmental dose-optimization program which includes automated exposure control, adjustment of the mA and/or kV according to patient size and/or use of iterative reconstruction technique. COMPARISON:  None Available. FINDINGS: Brain: The ventricles are normal in size and configuration. The basilar cisterns are patent. No mass, mass effect, or midline shift. No acute intracranial hemorrhage is seen. No abnormal extra-axial fluid collection. Preservation of the normal cortical gray-white interface without CT evidence of an acute major vascular territorial cortical based infarction. Vascular: No hyperdense vessel or unexpected calcification. Skull: Normal. Negative for fracture or focal lesion. Sinuses/Orbits: The visualized orbits are unremarkable. Mild inferior left maxillary sinus mucosal opacification. The visualized mastoid air cells are clear. Other: None.  IMPRESSION: No acute intracranial process. Mild inferior left maxillary sinus mucosal opacification. Electronically Signed   By: Yvonne Kendall M.D.   On: 12/27/2021 18:51  ? ?CT Cervical Spine Wo Contrast ? ?Result Date: 12/27/2021 ?CLINICAL DATA:  Trauma. EXAM: CT CERVICAL SPINE WITHOUT CONTRAST TECHNIQUE: Multidetector CT imaging of the cervical spine was performed without intravenous contrast. Multiplanar CT image reconstructions were also generated. RADIATION DOSE REDUCTION: This exam was performed according to the departmental dose-optimization program which includes automated exposure control, adjustment of the mA and/or kV according to patient size and/or use of iterative reconstruction technique. COMPARISON:  Cervical spine radiographs 11/29/2017 FINDINGS: Alignment: There is 1-2 mm grade 1 anterolisthesis of C5 on C6, 2 mm grade 1 anterolisthesis of C6 on C7, and 1-2 mm grade 1 anterolisthesis of C7 on T1, the atlantodens interval is intact with mild-to-moderate degenerative change. The facet joints are appropriately aligned. Unchanged from prior 11/29/2017 radiographs. Skull base and vertebrae: Vertebral body heights are maintained. Minimal anterior C5-6 endplate spurring with tiny ossicle anterior to the C5-6 disc space. Minimal posterior C5-6: Mild-to-moderate diffuse C7-T1  and mild diffuse T1-2 disc space narrowing. No acute fracture is seen. Soft tissues and spinal canal: No prevertebral fluid or swelling. No visible canal hematoma. Disc levels: Multilevel degenerative changes including disc space narrowing, uncovertebral hypertrophy, and facet joint hypertrophy contribute to moderate right and mild left C5-6, mild-to-moderate right and mild left C6-7, and mild left C7-T1 neuroforaminal narrowing. No significant central canal stenosis. Upper chest: There is ground-glass opacification within the lung apices. Other: There is mild scattered air within the soft tissues of the predominantly posterolateral  left superior hemithorax peripheral extrapleural space. Tiny foci of air seen within the left subclavian vein (axial series 6 images 94 through 98). This may be related to placement of an IV. Note is made that IV

## 2021-12-29 NOTE — Anesthesia Preprocedure Evaluation (Signed)
Anesthesia Evaluation  ?Patient identified by MRN, date of birth, ID band ?Patient awake ? ? ? ?Reviewed: ?Allergy & Precautions, H&P , NPO status , Patient's Chart, lab work & pertinent test results ? ?Airway ?Mallampati: II ? ? ?Neck ROM: full ? ? ? Dental ?  ?Pulmonary ?neg pulmonary ROS,  ?  ?breath sounds clear to auscultation ? ? ? ? ? ? Cardiovascular ?+ dysrhythmias Atrial Fibrillation  ?Rhythm:regular Rate:Normal ? ? ?  ?Neuro/Psych ?  ? GI/Hepatic ?  ?Endo/Other  ? ? Renal/GU ?  ? ?  ?Musculoskeletal ? ? Abdominal ?  ?Peds ? Hematology ?  ?Anesthesia Other Findings ? ? Reproductive/Obstetrics ? ?  ? ? ? ? ? ? ? ? ? ? ? ? ? ?  ?  ? ? ? ? ? ? ? ? ?Anesthesia Physical ?Anesthesia Plan ? ?ASA: 2 ? ?Anesthesia Plan: General  ? ?Post-op Pain Management: Regional block*  ? ?Induction:  ? ?PONV Risk Score and Plan: 3 and Ondansetron, Dexamethasone, Midazolam and Treatment may vary due to age or medical condition ? ?Airway Management Planned: Oral ETT ? ?Additional Equipment:  ? ?Intra-op Plan:  ? ?Post-operative Plan: Extubation in OR ? ?Informed Consent: I have reviewed the patients History and Physical, chart, labs and discussed the procedure including the risks, benefits and alternatives for the proposed anesthesia with the patient or authorized representative who has indicated his/her understanding and acceptance.  ? ? ? ?Dental advisory given ? ?Plan Discussed with: CRNA, Anesthesiologist and Surgeon ? ?Anesthesia Plan Comments:   ? ? ? ? ? ? ?Anesthesia Quick Evaluation ? ?

## 2021-12-29 NOTE — Progress Notes (Signed)
I discussed with the patient the risks and benefits of surgery for right ankle trimalleolar dislocation, including the possibility of infection, nerve injury, vessel injury, wound breakdown, arthritis, symptomatic hardware, DVT/ PE, loss of motion, malunion, nonunion, and need for further surgery among others.  We also specifically discussed the possible need to stage surgery because of the elevated risk of soft tissue breakdown that could lead to amputation.  She acknowledged these risks and wished to proceed. ? ?Altamese Fellows, MD ?[facility], [place] ?[phone] ? ?

## 2021-12-29 NOTE — Transfer of Care (Signed)
Immediate Anesthesia Transfer of Care Note ? ?Patient: Terri Drake ? ?Procedure(s) Performed: OPEN REDUCTION INTERNAL FIXATION (ORIF) ANKLE FRACTURE (Right: Ankle) ? ?Patient Location: PACU ? ?Anesthesia Type:GA combined with regional for post-op pain ? ?Level of Consciousness: drowsy ? ?Airway & Oxygen Therapy: Patient Spontanous Breathing and Patient connected to face mask oxygen ? ?Post-op Assessment: Report given to RN and Post -op Vital signs reviewed and stable ? ?Post vital signs: Reviewed and stable ? ?Last Vitals:  ?Vitals Value Taken Time  ?BP 135/59 12/29/21 1119  ?Temp    ?Pulse 69 12/29/21 1121  ?Resp 20 12/29/21 1121  ?SpO2 95 % 12/29/21 1121  ?Vitals shown include unvalidated device data. ? ?Last Pain:  ?Vitals:  ? 12/29/21 0500  ?TempSrc: Oral  ?PainSc:   ?   ? ?Patients Stated Pain Goal: 3 (12/29/21 4008) ? ?Complications: No notable events documented. ?

## 2021-12-29 NOTE — Anesthesia Procedure Notes (Signed)
Anesthesia Regional Block: Popliteal block  ? ?Pre-Anesthetic Checklist: , timeout performed,  Correct Patient, Correct Site, Correct Laterality,  Correct Procedure, Correct Position, site marked,  Risks and benefits discussed,  Surgical consent,  Pre-op evaluation,  At surgeon's request and post-op pain management ? ?Laterality: Right ? ?Prep: chloraprep     ?  ?Needles:  ?Injection technique: Single-shot ? ?Needle Type: Echogenic Stimulator Needle   ? ? ? ? ? ? ? ?Additional Needles: ? ? ?Procedures:, nerve stimulator,,,,,    ? ?Nerve Stimulator or Paresthesia:  ?Response: plantar flexion of foot, 0.45 mA ? ?Additional Responses:  ? ?Narrative:  ?Start time: 12/29/2021 8:25 AM ?End time: 12/29/2021 8:30 AM ?Injection made incrementally with aspirations every 5 mL. ? ?Performed by: Personally  ?Anesthesiologist: Achille Rich, MD ? ?Additional Notes: ?Functioning IV was confirmed and monitors were applied.  A 7mm 21ga Arrow echogenic stimulator needle was used. Sterile prep and drape,hand hygiene and sterile gloves were used.  Negative aspiration and negative test dose prior to incremental administration of local anesthetic. The patient tolerated the procedure well. ? Ultrasound guidance: relevent anatomy identified, needle position confirmed, local anesthetic spread visualized around nerve(s), vascular puncture avoided.  Image printed for medical record.  ? ? ? ? ?

## 2021-12-29 NOTE — Anesthesia Procedure Notes (Signed)
Anesthesia Regional Block: Adductor canal block  ? ?Pre-Anesthetic Checklist: , timeout performed,  Correct Patient, Correct Site, Correct Laterality,  Correct Procedure, Correct Position, site marked,  Risks and benefits discussed,  Surgical consent,  Pre-op evaluation,  At surgeon's request and post-op pain management ? ?Laterality: Right ? ?Prep: chloraprep     ?  ?Needles:  ?Injection technique: Single-shot ? ?Needle Type: Echogenic Needle   ? ? ?Needle Length: 9cm  ?Needle Gauge: 21  ? ? ? ?Additional Needles: ? ? ?Narrative:  ?Start time: 12/29/2021 8:30 AM ?End time: 12/29/2021 8:36 AM ?Injection made incrementally with aspirations every 5 mL. ? ?Performed by: Personally  ?Anesthesiologist: Albertha Ghee, MD ? ?Additional Notes: ?Pt tolerated the procedure well. ? ? ? ? ?

## 2021-12-29 NOTE — Plan of Care (Signed)

## 2021-12-29 NOTE — Anesthesia Procedure Notes (Signed)
Procedure Name: Intubation ?Date/Time: 12/29/2021 8:53 AM ?Performed by: Talbot Grumbling, CRNA ?Pre-anesthesia Checklist: Patient identified, Emergency Drugs available, Suction available and Patient being monitored ?Patient Re-evaluated:Patient Re-evaluated prior to induction ?Oxygen Delivery Method: Circle system utilized ?Preoxygenation: Pre-oxygenation with 100% oxygen ?Induction Type: IV induction ?Ventilation: Mask ventilation without difficulty ?Laryngoscope Size: Mac and 3 ?Grade View: Grade I ?Tube type: Oral ?Tube size: 7.5 mm ?Number of attempts: 1 ?Airway Equipment and Method: Stylet ?Placement Confirmation: ETT inserted through vocal cords under direct vision, positive ETCO2 and breath sounds checked- equal and bilateral ?Secured at: 22 cm ?Tube secured with: Tape ?Dental Injury: Teeth and Oropharynx as per pre-operative assessment  ? ? ? ? ?

## 2021-12-29 NOTE — Progress Notes (Signed)
OT Cancellation Note ? ?Patient Details ?Name: Terri Drake ?MRN: [id number] ?DOB: Nov 30, 1960 ? ? ?Cancelled Treatment:    Reason Eval/Treat Not Completed: Patient at procedure or test/ unavailable ? ?[name] OTR/L  ?Acute Rehab Services  ?[phone] office number ?[phone] pager number ? ?[name] ?12/29/2021, 7:50 AM ?

## 2021-12-29 NOTE — Op Note (Signed)
12/29/2021  7:09 PM  PATIENT:  Terri Drake  [birth year] female   MEDICAL RECORD NUMBER: [id number]  PRE-OPERATIVE DIAGNOSIS:  right trimalleolar fracture dislocation  POST-OPERATIVE DIAGNOSIS:  right trimalleolar fracture dislocation  PROCEDURE:   OPEN REDUCTION INTERNAL FIXATION OF RIGHT TRIMALLEOLAR ANKLE FRACTURE WITHOUT FIXATION OF THE POSTERIOR LIP OPEN REDUCTION INTERNAL FIXATION OF THE SYNDESMOSIS MANUAL APPLICATION OF STRESS ANKLE SYNDESMOSIS UNDER FLUOROSCOPY  SURGEON:  Astrid Divine. Marcelino Scot, M.D.  ASSISTANT:  PA Student.  ANESTHESIA:  General.  COMPLICATIONS:  None.  TOURNIQUET: None.  ESTIMATED BLOOD LOSS:  50 mL.  DISPOSITION:  To PACU.  CONDITION:  Stable.  DELAY START OF DVT PROPHYLAXIS BECAUSE OF BLEEDING RISK: NO   BRIEF SUMMARY AND INDICATIONS FOR PROCEDURE:  The patient is a 61 y.o. who sustained a fracture dislocation of the ankle, treated with reduction at the time of presentation to the Emergency Department, followed by splint application. Patient subsequently seen on floor with recurrent dislocation noted and skin tenting requiring re-reduction and splinting. She now presents for external fixation or surgical repair.  I discussed with the patient the risks and benefits of surgery including the possibility of infection, DVT, PE, nerve injury, vessel injury, loss of motion, arthritis, symptomatic hardware, heart attack, stroke and need for further surgery, among others. We specifically discussed syndesmotic repair and that some of the implants used for this could require subsequent removal. After acknowledging these risks, consent was provided to proceed.  SUMMARY OF PROCEDURE:  The patient was taken to the operating room after administration of a regional block and preoperative antibiotics.  The right lower extremity was prepped and draped in the usual sterile fashion.  A tourniquet was placed about the thigh but never inflated during the procedure.  A timeout  was held, and then an incision was made directly over the lateral malleolus with careful dissection to avoid injury to the superficial peroneal nerve.  The periosteum was left intact as we continued deep dissection.  The fracture site was identified and curettage and lavage used to remove hematoma.  With the assistance of distal manipulation and placement of tenaculums, we were able to obtain an anatomic reduction with  interdigitation of the primary fracture fragments.  Because of the angle and comminution of the fracture site, I was unable to place a lag screw but was able to hold this interdigitated during application of a lateral malleolus plate.  We used the Paragon system and placed standard fixation in the shaft and distal lateral malleolus, confirming plate position with x-ray and then continuing with a standard fixation as well as a locked fixation.  Final images showed appropriate reductional replacement, trajectory, and length.  Next, attention was turned to the medial side.  Here, a curvilinear incision was made to allow for distal placement of screws as well as direct access to the medial malleolus fracture site and joint.  I performed curettage of the fracture edges as well as copious lavage of the joint.  I did not identify any large fragments of articular cartilage loss off the dome of the talus. I then placed a drill hole in the medial metaphysis of the tibia and used a pointed tenaculum to gain compression and reduction of the fracture site, which was also seen to interdigitate as a 15 blade was used to scratch back the periosteum just at the edge for 2 mm or less.  This was followed by placement of K wires and then use of the cannulated drill placing 2 partially threaded  screws.  Excellent compression was obtained.  The C-arm was then brought back in and AP, lateral and mortise views showed restoration of ankle alignment reduction.    The posterior malleolar fracture fragment was then  evaluated to gauge whether it should be fixed if it constituted a significant portion of the articular surface or whether, as an attachment site of the syndesmotic ligaments, its disruption and displacement contributed to instability. Based on these factors it did not require fixation.   I then performed an external rotation stress view of the ankle under live fluoroscopy.  Instability was identified by lateral translation of the talus, widening of the syndesmotiic interval, and widening of the medial clear space. Consequently we proceeded with syndesmotic fixation. A small stab incision was made anteromedially and the pointed tenaculum used to apply pressure for reduction from head of most distal screw in the plate. Two tricortical screws were then placed from the fibula into the tibia with 15 degrees of anteversion.    PROGNOSIS: The patient will be nonweightbearing in the splint with ice and elevation over the next 3 to 5 days.  We will plan to see her back in the office in 10-14 days for removal of sutures and transition to a Cam boot with unrestricted range of motion of the  ankle at that time.  Weightbearing at 8 weeks.

## 2021-12-29 NOTE — Progress Notes (Signed)
Orthopedic Tech Progress Note ?Patient Details:  ?Terri Drake ?06/26/1961 ?[id number] ? ?Bone foam applied to RLE. Pt  already has LSO and arm sling, but does not want to wear them currently due to discomfort. LSO and sling were dropped off on 12/28/21 by a fellow ortho tech, [place]. ? ?Ortho Devices ?Type of Ortho Device: Bone foam zero knee ?Ortho Device/Splint Location: RLE ?Ortho Device/Splint Interventions: Ordered, Application ?  ?Post Interventions ?Patient Tolerated: Well ?Instructions Provided: Care of device, Adjustment of device ? ?Terri Drake ?12/29/2021, 5:18 PM ? ?

## 2021-12-30 ENCOUNTER — Encounter (HOSPITAL_COMMUNITY): Payer: Self-pay

## 2021-12-30 DIAGNOSIS — S82851A Displaced trimalleolar fracture of right lower leg, initial encounter for closed fracture: Secondary | ICD-10-CM

## 2021-12-30 DIAGNOSIS — S9304XA Dislocation of right ankle joint, initial encounter: Secondary | ICD-10-CM

## 2021-12-30 DIAGNOSIS — S93431A Sprain of tibiofibular ligament of right ankle, initial encounter: Secondary | ICD-10-CM

## 2021-12-30 HISTORY — DX: Sprain of tibiofibular ligament of right ankle, initial encounter: S93.431A

## 2021-12-30 HISTORY — DX: Dislocation of right ankle joint, initial encounter: S93.04XA

## 2021-12-30 HISTORY — DX: Displaced trimalleolar fracture of right lower leg, initial encounter for closed fracture: S82.851A

## 2021-12-30 LAB — CBC
HCT: 29.1 % — ABNORMAL LOW (ref 36.0–46.0)
Hemoglobin: 10.6 g/dL — ABNORMAL LOW (ref 12.0–15.0)
MCH: 34.9 pg — ABNORMAL HIGH (ref 26.0–34.0)
MCHC: 36.4 g/dL — ABNORMAL HIGH (ref 30.0–36.0)
MCV: 95.7 fL (ref 80.0–100.0)
Platelets: 130 10*3/uL — ABNORMAL LOW (ref 150–400)
RBC: 3.04 MIL/uL — ABNORMAL LOW (ref 3.87–5.11)
RDW: 12.3 % (ref 11.5–15.5)
WBC: 9.8 10*3/uL (ref 4.0–10.5)
nRBC: 0 % (ref 0.0–0.2)

## 2021-12-30 LAB — BASIC METABOLIC PANEL
Anion gap: 8 (ref 5–15)
BUN: 9 mg/dL (ref 8–23)
CO2: 23 mmol/L (ref 22–32)
Calcium: 8.1 mg/dL — ABNORMAL LOW (ref 8.9–10.3)
Chloride: 101 mmol/L (ref 98–111)
Creatinine, Ser: 0.74 mg/dL (ref 0.44–1.00)
GFR, Estimated: >60 mL/min (ref >60)
Glucose, Bld: 126 mg/dL — ABNORMAL HIGH (ref 70–99)
Potassium: 3.8 mmol/L (ref 3.5–5.1)
Sodium: 132 mmol/L — ABNORMAL LOW (ref 135–145)

## 2021-12-30 MED ORDER — POLYETHYLENE GLYCOL 3350 17 G PO PACK
17.0000 g | PACK | Freq: Every day | ORAL | Status: DC
  Administered 2021-12-30 – 2022-01-02 (×3): 17 g via ORAL
  Filled 2021-12-30 (×3): qty 1

## 2021-12-30 MED ORDER — OXYCODONE HCL 5 MG PO TABS
5.0000 mg | ORAL_TABLET | ORAL | Status: DC | PRN
  Administered 2021-12-31 – 2022-01-01 (×2): 10 mg via ORAL
  Administered 2022-01-02: 5 mg via ORAL
  Filled 2021-12-30: qty 2
  Filled 2021-12-30: qty 1
  Filled 2021-12-30: qty 2

## 2021-12-30 MED ORDER — ZINC SULFATE 220 (50 ZN) MG PO CAPS
220.0000 mg | ORAL_CAPSULE | Freq: Every day | ORAL | Status: DC
  Administered 2021-12-30 – 2022-01-04 (×6): 220 mg via ORAL
  Filled 2021-12-30 (×6): qty 1

## 2021-12-30 MED ORDER — OXYCODONE HCL 5 MG PO TABS
5.0000 mg | ORAL_TABLET | ORAL | Status: DC | PRN
  Administered 2021-12-30: 5 mg via ORAL
  Filled 2021-12-30: qty 1

## 2021-12-30 MED ORDER — BETHANECHOL CHLORIDE 25 MG PO TABS
25.0000 mg | ORAL_TABLET | Freq: Three times a day (TID) | ORAL | Status: DC
  Administered 2021-12-30 – 2022-01-04 (×15): 25 mg via ORAL
  Filled 2021-12-30 (×17): qty 1

## 2021-12-30 MED ORDER — ASCORBIC ACID 500 MG PO TABS
500.0000 mg | ORAL_TABLET | Freq: Every day | ORAL | Status: DC
  Administered 2021-12-30 – 2022-01-04 (×6): 500 mg via ORAL
  Filled 2021-12-30 (×6): qty 1

## 2021-12-30 MED ORDER — MORPHINE SULFATE (PF) 2 MG/ML IV SOLN: 2.0000 mg | INTRAVENOUS | Status: DC | PRN

## 2021-12-30 NOTE — Evaluation (Signed)
Physical Therapy Evaluation ?Patient Details ?Name: Terri Drake ?MRN: [id number] ?DOB: 03-15-1961 ?Today's Date: 12/30/2021 ? ?History of Present Illness ? Pt is 61 yr old F admitted on 12/27/21 with obvious ankle deformity after being struck by tree branch. Imaging (+) for R trimalleolar fx; displaced L inferior scapular fx; L 2nd + 6th rib fx; possible T11-12 fx. Underwent R ankle ORIF on 5/11. PMH: a-fib  ?Clinical Impression ? Pt was previously independent with ADLs and gait prior to accident involving tree branch resulting in multiple fx.  Pt currently requiring min A to transfer partially to EOB before experiencing excruciating pain in L shoulder blade resulting in inability to progress to EOB/standing.  Pt demos dec activity tolerance, dec ROM/strength, and transfer difficulty and would benefit from skilled PT in acute care to address deficits indicated in initial eval and to progress to gait training/stair training as able.  Pt would be an excellent candidate for CIR as she is young, highly motivated, and has strong support from spouse for transition home. ?   ? ?Recommendations for follow up therapy are one component of a multi-disciplinary discharge planning process, led by the attending physician.  Recommendations may be updated based on patient status, additional functional criteria and insurance authorization. ? ?Follow Up Recommendations Acute inpatient rehab (3hours/day) (Pt would like to return home but had long discussion wtih spouse/pt re: safety concerns.  Recommended CIR at this time.  Pt/spouse states they will think about it.) ? ?  ?Assistance Recommended at Discharge Frequent or constant Supervision/Assistance  ?Patient can return home with the following ? A lot of help with walking and/or transfers;A lot of help with bathing/dressing/bathroom;Assist for transportation;Assistance with cooking/housework;Help with stairs or ramp for entrance ? ?  ?Equipment Recommendations  (Pt unable to  intitiate trial of DME today, however may require w/c, platform RW or knee scooter depending on pt progression)  ?Recommendations for Other Services ?    ?  ?Functional Status Assessment Patient has had a recent decline in their functional status and demonstrates the ability to make significant improvements in function in a reasonable and predictable amount of time.  ? ?  ?Precautions / Restrictions Precautions ?Precautions: Fall ?Required Braces or Orthoses: Sling;Other Brace ?Other Brace: LSO for OOB, sling for comfort (allowed to move LUE) ?Restrictions ?Weight Bearing Restrictions: Yes ?LUE Weight Bearing: Weight bearing as tolerated ?RLE Weight Bearing: Non weight bearing  ? ?  ? ?Mobility ? Bed Mobility ?Overal bed mobility: Needs Assistance ?Bed Mobility: Supine to Sit ?  ?  ?Supine to sit: Min assist ?  ?  ?General bed mobility comments: Pt. able to negotiate LEs to EOB with CGA.  Min A with pt pulling up with strong R UE to partially long sit EOB.  Pt experiences inc pain in L shoulder blade and begins crying.  After composing herself, pt attempts to use R UE to scoot pelvis forward to EOB but experiences inc pain in L shoulder and pt is unable to continue at this time.  Pt is assisted back to supine with min A. ?Patient Response: Cooperative ? ?Transfers ?  ?  ?  ?  ?  ?  ?  ?  ?  ?General transfer comment: PT educates pt on use of platform RW demonstrating activity, pt demos understanding but does not get a chance to practice secondary to inc pain with sitting. ?  ? ?Ambulation/Gait ?  ?  ?  ?  ?  ?  ?  ?  ? ?  Stairs ?  ?  ?  ?  ?  ? ?Wheelchair Mobility ?  ? ?Modified Rankin (Stroke Patients Only) ?  ? ?  ? ?Balance Overall balance assessment: Modified Independent ?Sitting-balance support: Single extremity supported ?Sitting balance-Leahy Scale: Good ?  ?  ?  ?  ?  ?  ?  ?  ?  ?  ?  ?  ?  ?  ?  ?  ?   ? ? ? ?Pertinent Vitals/Pain Pain Assessment ?Pain Assessment: 0-10 ?Pain Score: 10-Worst pain ever ?Pain  Location: Pt states "11/10" L shoulder pain ?Pain Descriptors / Indicators: Aching, Discomfort, Crying, Stabbing ?Pain Intervention(s): Limited activity within patient's tolerance, Monitored during session  ? ? ?Home Living Family/patient expects to be discharged to:: Private residence ?Living Arrangements: Spouse/significant other ?Available Help at Discharge: Family ?Type of Home: House ?Home Access: Stairs to enter;Other (comment) (Working on having ramp built) ?Entrance Stairs-Rails: Can reach both ?Entrance Stairs-Number of Steps: 3 ?  ?Home Layout: One level ?Home Equipment: None ?   ?  ?Prior Function Prior Level of Function : Independent/Modified Independent ?  ?  ?  ?  ?  ?  ?  ?  ?  ? ? ?Hand Dominance  ?   ? ?  ?Extremity/Trunk Assessment  ? Upper Extremity Assessment ?Upper Extremity Assessment: Defer to OT evaluation ?  ? ?Lower Extremity Assessment ?Lower Extremity Assessment: RLE deficits/detail;LLE deficits/detail ?RLE Deficits / Details: R ankle immobilized ?RLE: Unable to fully assess due to immobilization ?LLE Deficits / Details: Dec ROM L knee ?  ? ?   ?Communication  ? Communication: No difficulties  ?Cognition Arousal/Alertness: Awake/alert ?Behavior During Therapy: [facility] for tasks assessed/performed ?Overall Cognitive Status: Within Functional Limits for tasks assessed ?  ?  ?  ?  ?  ?  ?  ?  ?  ?  ?  ?  ?  ?  ?  ?  ?General Comments: Pt is supine in bed when PT arrives.  C/o fatigue and inc pain, NSG provides pain medicine.   Agreeable to work with PT.  Alert and oriented x 3. ?  ?  ? ?  ?General Comments General comments (skin integrity, edema, etc.): Pt has concerns about bandage on her back needing to be changed.  NSG notified. ? ?  ?Exercises    ? ?Assessment/Plan  ?  ?PT Assessment Patient needs continued PT services  ?PT Problem List Decreased strength;Decreased mobility;Decreased range of motion;Decreased activity tolerance;Decreased balance;Pain;Decreased knowledge of use of DME ? ?   ?   ?PT Treatment Interventions DME instruction;Therapeutic exercise;Gait training;Balance training;Stair training;Functional mobility training;Therapeutic activities;Patient/family education   ? ?PT Goals (Current goals can be found in the Care Plan section)  ?Acute Rehab PT Goals ?Patient Stated Goal: Pt's goal is to return to independence and return home. ?PT Goal Formulation: With patient/family ?Time For Goal Achievement: 01/13/22 ?Potential to Achieve Goals: Good ? ?  ?Frequency Min 5X/week ?  ? ? ?Co-evaluation   ?  ?  ?  ?  ? ? ?  ?AM-PAC PT "6 Clicks" Mobility  ?Outcome Measure Help needed turning from your back to your side while in a flat bed without using bedrails?: A Lot ?Help needed moving from lying on your back to sitting on the side of a flat bed without using bedrails?: A Lot ?Help needed moving to and from a bed to a chair (including a wheelchair)?: Total ?Help needed standing up from a chair using your arms (e.g.,  wheelchair or bedside chair)?: A Lot ?Help needed to walk in hospital room?: Total ?Help needed climbing 3-5 steps with a railing? : Total ?6 Click Score: 9 ? ?  ?End of Session   ?Activity Tolerance: Patient limited by pain ?Patient left: in bed;with family/visitor present;with call bell/phone within reach ?Nurse Communication: Mobility status ?PT Visit Diagnosis: Pain;Other abnormalities of gait and mobility (R26.89) ?Pain - Right/Left: Left ?Pain - part of body: Shoulder ?  ? ?Time: [id number] ?PT Time Calculation (min) (ACUTE ONLY): 41 min ? ? ?Charges:   PT Evaluation ?$PT Eval Low Complexity: 1 Low ?PT Treatments ?$Therapeutic Activity: 23-37 mins ?  ?   ? ?[place] A. Baylen Buckner, PT, DPT ?Acute Rehabilitation Services ?Office: [phone]  ? ?[place] ?12/30/2021, 2:31 PM ? ?

## 2021-12-30 NOTE — Discharge Instructions (Signed)
? ?Orthopaedic Trauma Service Discharge Instructions ? ? ?General Discharge Instructions ? ?Orthopaedic Injuries: ? Right ankle fracture dislocation treated with open reduction internal fixation using plate and screws ?           Left scapular fracture treated nonoperatively ? ?WEIGHT BEARING STATUS: Nonweightbearing right leg, weight-bear as tolerated left upper extremity.  Use walker or knee scooter ? ?RANGE OF MOTION/ACTIVITY: Unrestricted range of motion right knee.  No ankle motion at this time as you are splinted.  Unrestricted range of motion of left shoulder.  Sling for comfort left upper extremity.  Come out of sling several times a day to work on motion of shoulder, elbow, forearm wrist and hand ? ?Activity as tolerated while maintaining weightbearing restrictions ? ?Bone health: Recommend 5000 IUs vitamin D3 daily ? ?Review the following resource for additional information regarding bone health ? ?[email or web address].com ? ?Wound Care: Keep splint clean and dry.  Do not remove splint.  Can get cast cover so you can bathe  ? ? ?Diet: as you were eating previously.  Can use over the counter stool softeners and bowel preparations, such as Miralax, to help with bowel movements.  Narcotics can be constipating.  Be sure to drink plenty of fluids ? ?PAIN MEDICATION USE AND EXPECTATIONS ? You have likely been given narcotic medications to help control your pain.  After a traumatic event that results in an fracture (broken bone) with or without surgery, it is ok to use narcotic pain medications to help control one's pain.  We understand that everyone responds to pain differently and each individual patient will be evaluated on a regular basis for the continued need for narcotic medications. Ideally, narcotic medication use should last no more than 6-8 weeks (coinciding with fracture healing).  ? As a patient it is your responsibility as well to monitor narcotic medication use and report the  amount and frequency you use these medications when you come to your office visit.  ? We would also advise that if you are using narcotic medications, you should take a dose prior to therapy to maximize you participation. ? ?IF YOU ARE ON NARCOTIC MEDICATIONS IT IS NOT PERMISSIBLE TO OPERATE A MOTOR VEHICLE (MOTORCYCLE/CAR/TRUCK/MOPED) OR HEAVY MACHINERY ?DO NOT MIX NARCOTICS WITH OTHER CNS (CENTRAL NERVOUS SYSTEM) DEPRESSANTS SUCH AS ALCOHOL ? ? ?POST-OPERATIVE OPIOID TAPER INSTRUCTIONS: ?It is important to wean off of your opioid medication as soon as possible. If you do not need pain medication after your surgery it is ok to stop day one. ?Opioids include: ?Codeine, Hydrocodone(Norco, Vicodin), Oxycodone(Percocet, oxycontin) and hydromorphone amongst others.  ?Long term and even short term use of opiods can cause: ?Increased pain response ?Dependence ?Constipation ?Depression ?Respiratory depression ?And more.  ?Withdrawal symptoms can include ?Flu like symptoms ?Nausea, vomiting ?And more ?Techniques to manage these symptoms ?Hydrate well ?Eat regular healthy meals ?Stay active ?Use relaxation techniques(deep breathing, meditating, yoga) ?Do Not substitute Alcohol to help with tapering ?If you have been on opioids for less than two weeks and do not have pain than it is ok to stop all together.  ?Plan to wean off of opioids ?This plan should start within one week post op of your fracture surgery  ?Maintain the same interval or time between taking each dose and first decrease the dose.  ?Cut the total daily intake of opioids by one tablet each day ?Next start to increase the time between doses. ?The last dose that should be eliminated is the evening dose.  ? ? ?  STOP SMOKING OR USING NICOTINE PRODUCTS!!!! ? As discussed nicotine severely impairs your body's ability to heal surgical and traumatic wounds but also impairs bone healing.  Wounds and bone heal by forming microscopic blood vessels (angiogenesis) and  nicotine is a vasoconstrictor (essentially, shrinks blood vessels).  Therefore, if vasoconstriction occurs to these microscopic blood vessels they essentially disappear and are unable to deliver necessary nutrients to the healing tissue.  This is one modifiable factor that you can do to dramatically increase your chances of healing your injury.   ? (This means no smoking, no nicotine gum, patches, etc) ? ?DO NOT USE NONSTEROIDAL ANTI-INFLAMMATORY DRUGS (NSAID'S) ? Using products such as Advil (ibuprofen), Aleve (naproxen), Motrin (ibuprofen) for additional pain control during fracture healing can delay and/or prevent the healing response.  If you would like to take over the counter (OTC) medication, Tylenol (acetaminophen) is ok.  However, some narcotic medications that are given for pain control contain acetaminophen as well. Therefore, you should not exceed more than 4000 mg of tylenol in a day if you do not have liver disease.  Also note that there are may OTC medicines, such as cold medicines and allergy medicines that my contain tylenol as well.  If you have any questions about medications and/or interactions please ask your doctor/PA or your pharmacist.  ?   ? ?ICE AND ELEVATE INJURED/OPERATIVE EXTREMITY ? Using ice and elevating the injured extremity above your heart can help with swelling and pain control.  Icing in a pulsatile fashion, such as 20 minutes on and 20 minutes off, can be followed.   ? Do not place ice directly on skin. Make sure there is a barrier between to skin and the ice pack.   ? Using frozen items such as frozen peas works well as the conform nicely to the are that needs to be iced. ? ?USE AN ACE WRAP OR TED HOSE FOR SWELLING CONTROL ? In addition to icing and elevation, Ace wraps or TED hose are used to help limit and resolve swelling.  It is recommended to use Ace wraps or TED hose until you are informed to stop.   ? When using Ace Wraps start the wrapping distally (farthest away from  the body) and wrap proximally (closer to the body) ?  Example: If you had surgery on your leg or thing and you do not have a splint on, start the ace wrap at the toes and work your way up to the thigh ?       If you had surgery on your upper extremity and do not have a splint on, start the ace wrap at your fingers and work your way up to the upper arm ? ?IF YOU ARE IN A SPLINT OR CAST DO NOT REMOVE IT FOR ANY REASON  ? If your splint gets wet for any reason please contact the office immediately. You may shower in your splint or cast as long as you keep it dry.  This can be done by wrapping in a cast cover or garbage back (or similar) ? Do Not stick any thing down your splint or cast such as pencils, money, or hangers to try and scratch yourself with.  If you feel itchy take benadryl as prescribed on the bottle for itching ? ?IF YOU ARE IN A CAM BOOT (BLACK BOOT) ? You may remove boot periodically. Perform daily dressing changes as noted below.  Wash the liner of the boot regularly and wear a sock when wearing  the boot. It is recommended that you sleep in the boot until told otherwise ? ? ? ?Call office for the following: ?Temperature greater than 101F ?Persistent nausea and vomiting ?Severe uncontrolled pain ?Redness, tenderness, or signs of infection (pain, swelling, redness, odor or green/yellow discharge around the site) ?Difficulty breathing, headache or visual disturbances ?Hives ?Persistent dizziness or light-headedness ?Extreme fatigue ?Any other questions or concerns you may have after discharge ? ?In an emergency, call 911 or go to an Emergency Department at a nearby hospital ? ?HELPFUL INFORMATION ? ?If you had a block, it will wear off between 8-24 hrs postop typically.  This is period when your pain may go from nearly zero to the pain you would have had postop without the block.  This is an abrupt transition but nothing dangerous is happening.  You may take an extra dose of narcotic when this  happens. ? ?You should wean off your narcotic medicines as soon as you are able.  Most patients will be off or using minimal narcotics before their first postop appointment.  ? ?We suggest you use the pain medication the

## 2021-12-30 NOTE — Anesthesia Postprocedure Evaluation (Signed)
Anesthesia Post Note ? ?Patient: Terri Drake ? ?Procedure(s) Performed: OPEN REDUCTION INTERNAL FIXATION (ORIF) ANKLE FRACTURE (Right: Ankle) ? ?  ? ?Patient location during evaluation: PACU ?Anesthesia Type: General and Regional ?Level of consciousness: awake and alert ?Pain management: pain level controlled ?Vital Signs Assessment: post-procedure vital signs reviewed and stable ?Respiratory status: spontaneous breathing, nonlabored ventilation, respiratory function stable and patient connected to nasal cannula oxygen ?Cardiovascular status: blood pressure returned to baseline and stable ?Postop Assessment: no apparent nausea or vomiting ?Anesthetic complications: no ? ? ?No notable events documented. ? ?Last Vitals:  ?Vitals:  ? 12/30/21 0158 12/30/21 0427  ?BP: 130/87 (!) 118/53  ?Pulse: 72 68  ?Resp: 17 14  ?Temp: 36.9 ?C 36.8 ?C  ?SpO2: 98% 98%  ?  ?Last Pain:  ?Vitals:  ? 12/30/21 0427  ?TempSrc: Oral  ?PainSc:   ? ? ?  ?  ?  ?  ?  ?  ? ?Charlet Harr S ? ? ? ? ?

## 2021-12-30 NOTE — Progress Notes (Signed)
Inpatient Rehab Admissions Coordinator:  ? ?Per PT recommendation pt was screened for CIR by Shann Medal, PT, DPT.  Note initial eval limited by pain, but expect to improve quickly as pain control achieved.  Will place rehab consult order per protocol.  ? ?Shann Medal, PT, DPT ?Admissions Coordinator ?[phone] ?12/30/21  ?2:39 PM ? ?

## 2021-12-30 NOTE — Progress Notes (Signed)
? ?                              Orthopaedic Trauma Service Progress Note ? ?Patient ID: ?Terri Drake ?MRN: [id number] ?DOB/AGE: Jun 03, 1961 61 y.o. ? ?Subjective: ? ?Doing ok this am  ?Block still working but starting to get some tingling ? We discussed taking a low dose of her narcotic medications at this time in an effort prevent her pain from getting out of control as the block wears off ? ?Wants to try knee scooter and platform walker given her L scapula fracture  ? ?ROS ?As above ? ?Objective:  ? ?VITALS:   ?Vitals:  ? 12/29/21 2108 12/30/21 0158 12/30/21 0427 12/30/21 1029  ?BP: 138/71 130/87 (!) 118/53 (!) 153/73  ?Pulse: 64 72 68 65  ?Resp: 16 17 14 18   ?Temp: 98.7 ?F (37.1 ?C) 98.5 ?F (36.9 ?C) 98.2 ?F (36.8 ?C) 98.2 ?F (36.8 ?C)  ?TempSrc: Oral Oral Oral Oral  ?SpO2: 98% 98% 98% 97%  ?Weight:      ?Height:      ? ? ?Estimated body mass index is 29.91 kg/m? as calculated from the following: ?  Height as of this encounter: 5\' 7"  (1.702 m). ?  Weight as of this encounter: 86.6 kg. ? ? ?Intake/Output   ?   05/11 0701 ?05/12 0700 05/12 0701 ?05/13 0700  ? P.O. 480   ? I.V. (mL/kg) 1500 (17.3) 700 (8.1)  ? Total Intake(mL/kg) 1980 (22.9) 700 (8.1)  ? Urine (mL/kg/hr) 650 (0.3)   ? Blood 25   ? Total Output 675   ? Net +1305 +700  ?     ?  ? ?LABS ? ?Results for orders placed or performed during the hospital encounter of 12/27/21 (from the past 24 hour(s))  ?CBC     Status: Abnormal  ? Collection Time: 12/30/21  2:30 AM  ?Result Value Ref Range  ? WBC 9.8 4.0 - 10.5 K/uL  ? RBC 3.04 (L) 3.87 - 5.11 MIL/uL  ? Hemoglobin 10.6 (L) 12.0 - 15.0 g/dL  ? HCT 29.1 (L) 36.0 - 46.0 %  ? MCV 95.7 80.0 - 100.0 fL  ? MCH 34.9 (H) 26.0 - 34.0 pg  ? MCHC 36.4 (H) 30.0 - 36.0 g/dL  ? RDW 12.3 11.5 - 15.5 %  ? Platelets 130 (L) 150 - 400 K/uL  ? nRBC 0.0 0.0 - 0.2 %  ?Basic metabolic panel     Status: Abnormal  ? Collection Time: 12/30/21  2:30 AM  ?Result Value Ref Range  ? Sodium 132  (L) 135 - 145 mmol/L  ? Potassium 3.8 3.5 - 5.1 mmol/L  ? Chloride 101 98 - 111 mmol/L  ? CO2 23 22 - 32 mmol/L  ? Glucose, Bld 126 (H) 70 - 99 mg/dL  ? BUN 9 8 - 23 mg/dL  ? Creatinine, Ser 0.74 0.44 - 1.00 mg/dL  ? Calcium 8.1 (L) 8.9 - 10.3 mg/dL  ? GFR, Estimated >60 >60 mL/min  ? Anion gap 8 5 - 15  ? ? ? ?PHYSICAL EXAM:  ? ?Gen: awake, sitting up in bed, NAD, appears well  ?Lungs: unlabored ?Ext:  ?     Right Lower Extremity  ? Splint and dressing clean, dry and intact ? Ext warm  ? + DP pulse ? Minimal swelling  ? EHL and FHL motor intact, lesser toe motor intact ? No sensation noted yet due to  block ? No pain out of proportion with passive stretching ? ?     Left upper extremity ? Mild swelling L shoulder ? Radial, ulnar, median nerve motor and sensory function intact.  Axillary nerve motor and sensory function intact ? + radial pulse ? Excellent digit, wrist, forearm, elbow ROM  ? Tolerates shoulder abd to about 30 degrees  ? ? ?Assessment/Plan: ?1 Day Post-Op  ? ? ?Anti-infectives (From admission, onward)  ? ? Start     Dose/Rate Route Frequency Ordered Stop  ? 12/29/21 1600  ceFAZolin (ANCEF) IVPB 2g/100 mL premix       ? 2 g ?200 mL/hr over 30 Minutes Intravenous Every 8 hours 12/29/21 1300 12/30/21 0848  ? 12/29/21 1000  ceFAZolin (ANCEF) IVPB 2g/100 mL premix       ? 2 g ?200 mL/hr over 30 Minutes Intravenous To ShortStay Surgical 12/28/21 1611 12/29/21 0915  ? 12/27/21 1800  ceFAZolin (ANCEF) IVPB 2g/100 mL premix       ? 2 g ?200 mL/hr over 30 Minutes Intravenous  Once 12/27/21 1746 12/27/21 1836  ? ?  ?. ? ?POD/HD#: 1 ? ?61 y/o female work related accident with closed right trimalleolar ankle fracture dislocation and closed left scapular body fracture  ? ?-work related injury  ? ?- closed right trimalleolar ankle fracture dislocation and syndesmotic disruption s/p ORIF  ? NWB R leg x 8 weeks ? Splint x 2 weeks then convert to CAM for ROM exercises  ? Therapy evals  ? Ice and elevate leg above heart  for swelling and pain control  ? Therapy evals  ? ?  Knee scooter and platform walker? ? ?- closed L scapular body fracture  ? Non-op ? ROM as tolerated ? Sling for comfort  ? Ice  ? ?- Pain management: ? Multimodal ? Block still working but starting to get some tingling  ?  Instructed to take low dose narcotic to minimize pain as block wears off  ? ?- ABL anemia/Hemodynamics ? Stable ? ?- Medical issues  ? Per trauma  ? ?- DVT/PE prophylaxis: ? Recommend xarelto 10 mg daily x 30 days at dc ? ?- ID:  ? Periop abx ? ?- Metabolic Bone Disease: ? Check vitamin d levels ? ?- Activity: ? As above ? ?- Dispo: ? Therapy evals ? Ortho issues stable ? Follow up with ortho in 10-14 days  ? ? ? ? ?Terri Latin, PA-C ?[phone] (C) ?12/30/2021, 11:07 AM ? ?[facility] ?[address] ?[place] [address] ?[phone] [place]) ?[phone] (F) ? ? ? ?After 5pm and on the weekends please log on to Amion, go to orthopaedics and the look under the [facility] Call for the provider(s) on call. You can also call our office at [phone] and then follow the prompts to be connected to the call team.  ? Patient ID: Terri Drake, female   DOB: 11-14-1960, 61 y.o.   MRN: [id number] ? ?

## 2021-12-30 NOTE — Evaluation (Signed)
Occupational Therapy Evaluation Patient Details Name: Terri Drake MRN: [id number] DOB: Jun 24, 1961 Today's Date: 12/30/2021   History of Present Illness Pt is 60 yr old F admitted on 12/27/21 with obvious ankle deformity after being struck by tree branch. Imaging (+) for R trimalleolar fx; displaced L inferior scapular fx; L 2nd + 6th rib fx; possible T11-12 fx. Underwent R ankle ORIF on 5/11. PMH: a-fib   Clinical Impression   Patient is s/p ORIF R ankle surgery resulting in functional limitations due to the deficits listed below (see OT problem list). Pt currently painful and fatigued so evaluation limited. OT to further progress patient next session. Pt with increased confidence and freely moving L UE elbow wrist and hand by the end of session. Pt working on abduction and shoulder flexion. Pt reports "I feel like I can try now that I have been shown how to do it properly.  Patient will benefit from skilled OT acutely to increase independence and safety with ADLS to allow discharge CIR.       Recommendations for follow up therapy are one component of a multi-disciplinary discharge planning process, led by the attending physician.  Recommendations may be updated based on patient status, additional functional criteria and insurance authorization.   Follow Up Recommendations  Acute inpatient rehab (3hours/day)    Assistance Recommended at Discharge Set up Supervision/Assistance  Patient can return home with the following Two people to help with walking and/or transfers;A lot of help with bathing/dressing/bathroom;Assist for transportation    Functional Status Assessment  Patient has had a recent decline in their functional status and demonstrates the ability to make significant improvements in function in a reasonable and predictable amount of time.  Equipment Recommendations  BSC/3in1;Wheelchair (measurements OT);Wheelchair cushion (measurements OT);Hospital bed    Recommendations for  Other Services Rehab consult     Precautions / Restrictions Precautions Precautions: Fall Required Braces or Orthoses: Sling;Other Brace Other Brace: LSO for OOB, sling for comfort (allowed to move LUE) Restrictions Weight Bearing Restrictions: Yes LUE Weight Bearing: Weight bearing as tolerated RLE Weight Bearing: Non weight bearing      Mobility Bed Mobility                    Transfers                   General transfer comment: platform in room and visual shown to patient as she was asking for details on use. Pt educated focus will be for transfer to w/c level and then with progression platform walker. Only with increased time and balance would a knee scooter be assessed      Balance   Sitting-balance support: No upper extremity supported, Feet supported Sitting balance-Leahy Scale: Good                                     ADL either performed or assessed with clinical judgement   ADL Overall ADL's : Needs assistance/impaired Eating/Feeding: Set up   Grooming: Applying deodorant   Upper Body Bathing: Moderate assistance   Lower Body Bathing: Moderate assistance   Upper Body Dressing : Moderate assistance   Lower Body Dressing: Maximal assistance                 General ADL Comments: educated on don doff brace and how the brace should be positioned. the brace was not don this session as  pt was not agreeable to EOB due to fatigue from bed level task prior to OT arrival. pt reports + bowel movement in bed pan today.Session bed level. pt educated and demonstrates HOB increase with remote control this session. pt advised with hob increase brace should be don for upight posture.     Vision Baseline Vision/History: 1 Wears glasses Ability to See in Adequate Light: 0 Adequate       Perception     Praxis      Pertinent Vitals/Pain Pain Assessment Pain Assessment: 0-10 Pain Score: 7  Pain Descriptors / Indicators: Aching,  Discomfort, Crying, Stabbing Pain Intervention(s): Monitored during session, Premedicated before session, Repositioned     Hand Dominance Right   Extremity/Trunk Assessment Upper Extremity Assessment Upper Extremity Assessment: LUE deficits/detail LUE Deficits / Details: focused on AROM hand wrist Elbow. Pt now actively moving hand wrist elbow. Pt starting to move into abduction  with less than 20 degrees and shoulder flexion ~15 degrees. Pt educated on the arm position required for platform and now more motivated. Pt reports increased sensation in 3rd 4th 5th digits LUE Sensation: decreased light touch LUE Coordination: decreased fine motor   Lower Extremity Assessment Lower Extremity Assessment: RLE deficits/detail;LLE deficits/detail RLE Deficits / Details: R ankle immobilized RLE: Unable to fully assess due to immobilization LLE Deficits / Details: Dec ROM L knee   Cervical / Trunk Assessment Cervical / Trunk Assessment: Other exceptions (back T12 fx)   Communication Communication Communication: No difficulties   Cognition Arousal/Alertness: Awake/alert Behavior During Therapy: WFL for tasks assessed/performed Overall Cognitive Status: Within Functional Limits for tasks assessed                                 General Comments: pt finishing movement in the bed with RN staff prior to OT session.     General Comments  ice pack provided to L shoulder    Exercises Exercises: Hand exercises Shoulder Exercises Shoulder Flexion: AAROM, Left, 15 reps, Supine Shoulder ABduction: AAROM, Left, 10 reps, Supine Elbow Flexion: AROM, 20 reps, Supine Elbow Extension: AROM, Left, 20 reps, Supine Wrist Flexion: AROM, 20 reps, Left, Supine Wrist Extension: AROM, 20 reps, Supine, Left Digit Composite Flexion: AROM, Left, 20 reps, Supine Hand Exercises Forearm Supination: AROM, Left, 20 reps, Supine Forearm Pronation: AROM, Left, 20 reps, Supine   Shoulder Instructions       Home Living Family/patient expects to be discharged to:: Private residence Living Arrangements: Spouse/significant other Available Help at Discharge: Family Type of Home: House Home Access: Stairs to enter;Other (comment) (Working on having ramp built) Secretary/administrator of Steps: 3 Entrance Stairs-Rails: Can reach both Home Layout: One level         Firefighter: Standard     Home Equipment: None          Prior Functioning/Environment Prior Level of Function : Independent/Modified Independent                        OT Problem List: Decreased activity tolerance;Impaired balance (sitting and/or standing);Pain;Impaired UE functional use;Decreased range of motion      OT Treatment/Interventions: Self-care/ADL training;Therapeutic exercise;Energy conservation;DME and/or AE instruction;Manual therapy;Modalities;Therapeutic activities;Balance training;Patient/family education;Neuromuscular education;Splinting    OT Goals(Current goals can be found in the care plan section) Acute Rehab OT Goals Patient Stated Goal: to get this arm moving before monday OT Goal Formulation: With patient Time For Goal Achievement:  01/13/22 Potential to Achieve Goals: Good  OT Frequency: Min 2X/week    Co-evaluation              AM-PAC OT "6 Clicks" Daily Activity     Outcome Measure Help from another person eating meals?: A Little Help from another person taking care of personal grooming?: A Little Help from another person toileting, which includes using toliet, bedpan, or urinal?: Total Help from another person bathing (including washing, rinsing, drying)?: Total Help from another person to put on and taking off regular upper body clothing?: Total Help from another person to put on and taking off regular lower body clothing?: Total 6 Click Score: 10   End of Session Nurse Communication: Mobility status;Precautions;Weight bearing status  Activity Tolerance: Patient  tolerated treatment well Patient left: in bed;with call bell/phone within reach;with bed alarm set  OT Visit Diagnosis: Unsteadiness on feet (R26.81);Muscle weakness (generalized) (M62.81)                Time: 3244-0102 OT Time Calculation (min): 39 min Charges:  OT General Charges $OT Visit: 1 Visit OT Evaluation $OT Eval Moderate Complexity: 1 Mod OT Treatments $Self Care/Home Management : 8-22 mins   Brynn, OTR/L  Acute Rehabilitation Services Office: [phone] .   Mateo Flow 12/30/2021, 4:57 PM

## 2021-12-30 NOTE — Progress Notes (Signed)
1 Day Post-Op  Subjective: CC: In good spirits today. Block wearing off on RLE. Able to wiggles toes and getting sensation back - some tingling. No real pain of the RLE. No rib pain except if she coughs. Using IS. No SOB. Still having L shoulder/scapula pain. Numbness in L hand/fingers resolving and able to make a fist today and extends all fingers. No other complaints. Tolerating diet but not eating much as she has a gluten allergy and there were not many options for her. No n/v. Passing flatus. No bm. Denies abdominal pain. Foley still in place. Has not been oob.   Objective: Vital signs in last 24 hours: Temp:  [98 F (36.7 C)-98.7 F (37.1 C)] 98.2 F (36.8 C) (05/12 1029) Pulse Rate:  [63-72] 65 (05/12 1029) Resp:  [11-19] 18 (05/12 1029) BP: (118-153)/(53-87) 153/73 (05/12 1029) SpO2:  [92 %-99 %] 97 % (05/12 1029) Last BM Date : 12/27/21  Intake/Output from previous day: 05/11 0701 - 05/12 0700 In: 1980 [P.O.:480; I.V.:1500] Out: 675 [Urine:650; Blood:25] Intake/Output this shift: Total I/O In: 700 [I.V.:700] Out: -   PE: Gen:  Alert, NAD, pleasant HEENT: EOM's intact, pupils equal and round Card:  RRR Pulm:  CTAB, no W/R/R, effort normal. On RA.  Abd: Soft, ND, NT, +BS GU: Foley in place w/ straw colored urine Ext: Splint to RLE. WWP. Able to wiggle toes. No LLE edema. Reports SILT to all digits of the left hand and improved sensation of the 4th and 5th digits compared to yesterday. She is able able to wiggle fingers, make a fist and extend fingers.  Psych: A&Ox3  Skin: Abrasions dressed. Skin warm and dry  Lab Results:  Recent Labs    12/28/21 0823 12/30/21 0230  WBC 7.1 9.8  HGB 12.1 10.6*  HCT 34.9* 29.1*  PLT 147* 130*   BMET Recent Labs    12/28/21 0823 12/30/21 0230  NA 136 132*  K 4.2 3.8  CL 110 101  CO2 22 23  GLUCOSE 121* 126*  BUN 9 9  CREATININE 0.74 0.74  CALCIUM 8.3* 8.1*   PT/INR Recent Labs    12/27/21 1746  LABPROT 13.1   INR 1.0   CMP     Component Value Date/Time   NA 132 (L) 12/30/2021 0230   K 3.8 12/30/2021 0230   CL 101 12/30/2021 0230   CO2 23 12/30/2021 0230   GLUCOSE 126 (H) 12/30/2021 0230   BUN 9 12/30/2021 0230   CREATININE 0.74 12/30/2021 0230   CALCIUM 8.1 (L) 12/30/2021 0230   PROT 6.2 (L) 12/27/2021 1746   ALBUMIN 3.9 12/27/2021 1746   AST 32 12/27/2021 1746   ALT 25 12/27/2021 1746   ALKPHOS 62 12/27/2021 1746   BILITOT 0.6 12/27/2021 1746   GFRNONAA >60 12/30/2021 0230   Lipase  No results found for: LIPASE  Studies/Results: DG Ankle Complete Right  Result Date: 12/29/2021 CLINICAL DATA:  Postop EXAM: RIGHT ANKLE - COMPLETE 3 VIEW COMPARISON:  Radiograph dated Dec 28, 2021 FINDINGS: Postsurgical changes from plate and screw fixation of the distal fibula and screws of medial malleolus. Hardware components appear in their expected alignment. Comminuted trimalleolar fractures with improved anatomic alignment. No new periprosthetic fracture is identified although overlying splint material somewhat limits evaluation. Expected postoperative changes within the overlying soft tissues. IMPRESSION: Postsurgical changes of ankle fracture ORIF. Electronically Signed   By: Allegra Lai M.D.   On: 12/29/2021 13:44   DG Ankle Complete Right  Result  Date: 12/29/2021 CLINICAL DATA:  Intraoperative fluoroscopy. Open reduction internal fixation upright ankle fracture. EXAM: RIGHT ANKLE - COMPLETE 3+ VIEW COMPARISON:  Right ankle radiographs 12/28/2021 and CT right ankle 12/27/2021 FINDINGS: Images were performed intraoperatively without the presence of a radiologist. The patient is undergoing lateral plate and screw fixation of the distal fibula. Distal tibiofibular syndesmosis fixation tight rope. Two screws traverse the medial malleolus. Improved alignment of the previously seen comminuted displaced trimalleolar fractures. Total fluoroscopy images: 5 Total fluoroscopy time: 35 seconds Total dose:  Radiation Exposure Index (as provided by the fluoroscopic device): 0.53 mGy air Kerma Please see intraoperative findings for further detail. IMPRESSION: Operative fluoroscopy for ORIF of the medial and lateral malleoli. Improved alignment. Electronically Signed   By: Yvonne Kendall M.D.   On: 12/29/2021 12:12   DG Ankle Complete Right  Result Date: 12/28/2021 CLINICAL DATA:  Reduction of ankle fracture dislocation. EXAM: RIGHT ANKLE - COMPLETE 3+ VIEW COMPARISON:  Radiographs 12/27/2021 FINDINGS: Limited bony detail due to the overlying plaster splint. Reduction of tibiotalar dislocation. The ankle mortise is normal. Good position of the medial malleolus fracture. Improved position of the posterior tibial fracture and the distal fibular shaft fracture. IMPRESSION: Reduction of tibiotalar dislocation and improved alignment of the fractures. Electronically Signed   By: Marijo Sanes M.D.   On: 12/28/2021 14:03   DG C-Arm 1-60 Min-No Report  Result Date: 12/29/2021 Fluoroscopy was utilized by the requesting physician.  No radiographic interpretation.   DG C-Arm 1-60 Min-No Report  Result Date: 12/29/2021 Fluoroscopy was utilized by the requesting physician.  No radiographic interpretation.    Anti-infectives: Anti-infectives (From admission, onward)    Start     Dose/Rate Route Frequency Ordered Stop   12/29/21 1600  ceFAZolin (ANCEF) IVPB 2g/100 mL premix        2 g 200 mL/hr over 30 Minutes Intravenous Every 8 hours 12/29/21 1300 12/30/21 0848   12/29/21 1000  ceFAZolin (ANCEF) IVPB 2g/100 mL premix        2 g 200 mL/hr over 30 Minutes Intravenous To ShortStay Surgical 12/28/21 1611 12/29/21 0915   12/27/21 1800  ceFAZolin (ANCEF) IVPB 2g/100 mL premix        2 g 200 mL/hr over 30 Minutes Intravenous  Once 12/27/21 1746 12/27/21 1836        Assessment/Plan 68F s/p hit by tree branch.  T11/12 endplate frx - NSGY c/s, Dr. Kathyrn Sheriff. Lumbar corset for comfort when the patient is up.  Therapies.  R ankle fx/dislocation- Reduced by EDP. Ortho c/s, Dr. Stann Mainland. Care transferred to Ortho trauma, Dr. Marcelino Scot - s/p ORIF 5/11. Recommended for NWB RLE 8 weeks. Splint x 2 weeks then covert to CAM for ROM exercises. 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 Will have OT see. L rib fx 2,6 - IS, pulm toilet, pain control.  L PTX - follow up CXR 5/10 without PTX. Continue IS and pulm toilet L scapula fx - ortho c/s, sling, WBAT Abrasions to L shoulder and L knee - local wound care A fib - Home Flecainide and Metoprolol  ABL anemia - hgb 10.6 from 12.1. not unexpected after Ortho surgery. AM labs.  FEN - Gluten free diet, d/c IVF, bowel regimen DVT - SCDs, LMWH ID - tdap and ancef given in ED Foley - placed for urinary retention. Inc Urecholine (allergy to flomax). TOV in AM Dispo -  Therapies. Am labs.    LOS: 3 days    Jillyn Ledger , [facility] Surgery 12/30/2021, 11:19 AM Please see Amion for pager number during day hours 7:00am-4:30pm

## 2021-12-31 LAB — CBC
HCT: 30.3 % — ABNORMAL LOW (ref 36.0–46.0)
Hemoglobin: 10.7 g/dL — ABNORMAL LOW (ref 12.0–15.0)
MCH: 34.2 pg — ABNORMAL HIGH (ref 26.0–34.0)
MCHC: 35.3 g/dL (ref 30.0–36.0)
MCV: 96.8 fL (ref 80.0–100.0)
Platelets: 157 10*3/uL (ref 150–400)
RBC: 3.13 MIL/uL — ABNORMAL LOW (ref 3.87–5.11)
RDW: 12.5 % (ref 11.5–15.5)
WBC: 7.4 10*3/uL (ref 4.0–10.5)
nRBC: 0 % (ref 0.0–0.2)

## 2021-12-31 LAB — BASIC METABOLIC PANEL
Anion gap: 9 (ref 5–15)
BUN: 8 mg/dL (ref 8–23)
CO2: 21 mmol/L — ABNORMAL LOW (ref 22–32)
Calcium: 8 mg/dL — ABNORMAL LOW (ref 8.9–10.3)
Chloride: 105 mmol/L (ref 98–111)
Creatinine, Ser: 0.74 mg/dL (ref 0.44–1.00)
GFR, Estimated: >60 mL/min (ref >60)
Glucose, Bld: 96 mg/dL (ref 70–99)
Potassium: 3.4 mmol/L — ABNORMAL LOW (ref 3.5–5.1)
Sodium: 135 mmol/L (ref 135–145)

## 2021-12-31 LAB — VITAMIN D 25 HYDROXY (VIT D DEFICIENCY, FRACTURES): Vit D, 25-Hydroxy: 17.6 ng/mL — ABNORMAL LOW (ref 30–100)

## 2021-12-31 MED ORDER — KETOROLAC TROMETHAMINE 15 MG/ML IJ SOLN
15.0000 mg | Freq: Four times a day (QID) | INTRAMUSCULAR | Status: AC
Start: 2021-12-31 — End: 2022-01-01
  Administered 2021-12-31: 15 mg via INTRAVENOUS
  Filled 2021-12-31 (×2): qty 1

## 2021-12-31 MED ORDER — POTASSIUM CHLORIDE CRYS ER 20 MEQ PO TBCR
20.0000 meq | EXTENDED_RELEASE_TABLET | Freq: Two times a day (BID) | ORAL | Status: DC
  Administered 2021-12-31 – 2022-01-02 (×6): 20 meq via ORAL
  Filled 2021-12-31 (×6): qty 1

## 2021-12-31 NOTE — Progress Notes (Signed)
Ortho Trauma Note ? ?Pain controlled this morning.  Asking about the Toradol and how long this will last as she is concerned about her kidney function.  Has a Foley catheter in place secondary to urinary retention.  Ankle feels good.  She is moving her shoulder more. ? ?Left upper extremity: Able to abduct to approximately 30 degrees.  She is neurovascularly intact. ? ?Right lower extremity: Splint is in place clean dry and intact she is able to wiggle her toes and sensation is intact light touch. ? ?Hemoglobin this morning is 10.4. ? ?Assessment/plan: Right trimalleolar ankle fracture status post ORIF and a left closed scapular body fracture. ? ?Nonweightbearing right lower extremity with a splint in place until 2-week follow-up ?Weightbearing as tolerated left upper extremity range of motion as tolerated ?Foley catheter per trauma surgery ?DVT prophylaxis would be Xarelto 10 mg at discharge ?Okay to discharge from orthopedic perspective.  Follow-up with Dr. Marcelino Scot in 10 to 14 days. ? ?Shona Needles, MD ?[facility] ?[phone] (office) ?[email or web address] ? ?

## 2021-12-31 NOTE — Progress Notes (Signed)
Inpatient Rehab Admissions Coordinator:  ? ? I spoke with pt and family regarding potential CIR admit. They state interest and indicate that family can provide 24/7 support at d/c. I will follow for potential admit pending insurance auth and bed availability. ? ?Maille Halliwell, MS, CCC-SLP ?Rehab Admissions Coordinator  ?[phone] (celll) ?[phone] (office) ? ?

## 2021-12-31 NOTE — Progress Notes (Signed)
Patient ID: Terri Drake, female   DOB: April 26, 1961, 61 y.o.   MRN: [id number] ?[facility] Progress Note:   2 Days Post-Op  ?Subjective: ?Mental status is clear.  Complaints scapular pain from fracture; back; right ankle . ?Objective: ?Vital signs in last 24 hours: ?Temp:  [98.1 ?F (36.7 ?C)-98.6 ?F (37 ?C)] 98.1 ?F (36.7 ?C) (05/13 0800) ?Pulse Rate:  [65-84] 84 (05/13 0800) ?Resp:  [17-18] 18 (05/13 0800) ?BP: (137-177)/(65-92) 164/92 (05/13 0800) ?SpO2:  [94 %-97 %] 96 % (05/13 0800) ? ?Intake/Output from previous day: ?05/12 0701 - 05/13 0700 ?In: 1180 [P.O.:480; I.V.:700] ?Out: 4350 [Urine:4350] ?Intake/Output this shift: ?No intake/output data recorded. ? ?Physical Exam: Work of breathing is not increased.  Right foot is pink ? ?Lab Results:  ?Results for orders placed or performed during the hospital encounter of 12/27/21 (from the past 48 hour(s))  ?CBC     Status: Abnormal  ? Collection Time: 12/30/21  2:30 AM  ?Result Value Ref Range  ? WBC 9.8 4.0 - 10.5 K/uL  ? RBC 3.04 (L) 3.87 - 5.11 MIL/uL  ? Hemoglobin 10.6 (L) 12.0 - 15.0 g/dL  ? HCT 29.1 (L) 36.0 - 46.0 %  ? MCV 95.7 80.0 - 100.0 fL  ? MCH 34.9 (H) 26.0 - 34.0 pg  ? MCHC 36.4 (H) 30.0 - 36.0 g/dL  ? RDW 12.3 11.5 - 15.5 %  ? Platelets 130 (L) 150 - 400 K/uL  ? nRBC 0.0 0.0 - 0.2 %  ?  Comment: Performed at [facility], [address], [place], [place] [address]  ?Basic metabolic panel     Status: Abnormal  ? Collection Time: 12/30/21  2:30 AM  ?Result Value Ref Range  ? Sodium 132 (L) 135 - 145 mmol/L  ? Potassium 3.8 3.5 - 5.1 mmol/L  ? Chloride 101 98 - 111 mmol/L  ? CO2 23 22 - 32 mmol/L  ? Glucose, Bld 126 (H) 70 - 99 mg/dL  ?  Comment: Glucose reference range applies only to samples taken after fasting for at least 8 hours.  ? BUN 9 8 - 23 mg/dL  ? Creatinine, Ser 0.74 0.44 - 1.00 mg/dL  ? Calcium 8.1 (L) 8.9 - 10.3 mg/dL  ? GFR, Estimated >60 >60 mL/min  ?  Comment: (NOTE) ?Calculated using the CKD-EPI Creatinine  Equation (2021) ?  ? Anion gap 8 5 - 15  ?  Comment: Performed at [facility], [address], [place], [place] [address]  ?VITAMIN D 25 Hydroxy (Vit-D Deficiency, Fractures)     Status: Abnormal  ? Collection Time: 12/31/21  1:29 AM  ?Result Value Ref Range  ? Vit D, 25-Hydroxy 17.60 (L) 30 - 100 ng/mL  ?  Comment: (NOTE) ?Vitamin D deficiency has been defined by the Institute of Medicine  ?and an Endocrine Society practice guideline as a level of serum 25-OH  ?vitamin D less than 20 ng/mL (1,2). The Endocrine Society went on to  ?further define vitamin D insufficiency as a level between 21 and 29  ?ng/mL (2). ? ?1. IOM [employer] of Medicine). 2010. Dietary reference intakes for  ?calcium and D. [place]: [employer]. ?2. Holick MF, Binkley [place], Bischoff-Ferrari HA, et al. Evaluation,  ?treatment, and prevention of vitamin D deficiency: an Endocrine  ?Society clinical practice guideline, JCEM. 2011 Jul; 96(7): 1911-30. ? ?Performed at [facility], [address] [place], [place] ?[address] ?  ?CBC  Status: Abnormal  ? Collection Time: 12/31/21  1:29 AM  ?Result Value Ref Range  ? WBC 7.4 4.0 - 10.5 K/uL  ? RBC 3.13 (L) 3.87 - 5.11 MIL/uL  ? Hemoglobin 10.7 (L) 12.0 - 15.0 g/dL  ? HCT 30.3 (L) 36.0 - 46.0 %  ? MCV 96.8 80.0 - 100.0 fL  ? MCH 34.2 (H) 26.0 - 34.0 pg  ? MCHC 35.3 30.0 - 36.0 g/dL  ? RDW 12.5 11.5 - 15.5 %  ? Platelets 157 150 - 400 K/uL  ? nRBC 0.0 0.0 - 0.2 %  ?  Comment: Performed at [facility], [address], [place], [place] [address]  ?Basic metabolic panel     Status: Abnormal  ? Collection Time: 12/31/21  1:29 AM  ?Result Value Ref Range  ? Sodium 135 135 - 145 mmol/L  ? Potassium 3.4 (L) 3.5 - 5.1 mmol/L  ? Chloride 105 98 - 111 mmol/L  ? CO2 21 (L) 22 - 32 mmol/L  ? Glucose, Bld 96 70 - 99 mg/dL  ?  Comment: Glucose reference range applies only to samples taken after fasting for at least 8 hours.  ? BUN 8 8 - 23 mg/dL  ? Creatinine, Ser 0.74  0.44 - 1.00 mg/dL  ? Calcium 8.0 (L) 8.9 - 10.3 mg/dL  ? GFR, Estimated >60 >60 mL/min  ?  Comment: (NOTE) ?Calculated using the CKD-EPI Creatinine Equation (2021) ?  ? Anion gap 9 5 - 15  ?  Comment: Performed at [facility], [address] [place], [place] [address]  ? ? ?Radiology/Results: ?DG Ankle Complete Right ? ?Result Date: 12/29/2021 ?CLINICAL DATA:  Postop EXAM: RIGHT ANKLE - COMPLETE 3 VIEW COMPARISON:  Radiograph dated Dec 28, 2021 FINDINGS: Postsurgical changes from plate and screw fixation of the distal fibula and screws of medial malleolus. Hardware components appear in their expected alignment. Comminuted trimalleolar fractures with improved anatomic alignment. No new periprosthetic fracture is identified although overlying splint material somewhat limits evaluation. Expected postoperative changes within the overlying soft tissues. IMPRESSION: Postsurgical changes of ankle fracture ORIF. Electronically Signed   By: Allegra Lai M.D.   On: 12/29/2021 13:44  ? ?DG Ankle Complete Right ? ?Result Date: 12/29/2021 ?CLINICAL DATA:  Intraoperative fluoroscopy. Open reduction internal fixation upright ankle fracture. EXAM: RIGHT ANKLE - COMPLETE 3+ VIEW COMPARISON:  Right ankle radiographs 12/28/2021 and CT right ankle 12/27/2021 FINDINGS: Images were performed intraoperatively without the presence of a radiologist. The patient is undergoing lateral plate and screw fixation of the distal fibula. Distal tibiofibular syndesmosis fixation tight rope. Two screws traverse the medial malleolus. Improved alignment of the previously seen comminuted displaced trimalleolar fractures. Total fluoroscopy images: 5 Total fluoroscopy time: 35 seconds Total dose: Radiation Exposure Index (as provided by the fluoroscopic device): 0.53 mGy air Kerma Please see intraoperative findings for further detail. IMPRESSION: Operative fluoroscopy for ORIF of the medial and lateral malleoli. Improved alignment. Electronically  Signed   By: Neita Garnet M.D.   On: 12/29/2021 12:12  ? ?DG C-Arm 1-60 Min-No Report ? ?Result Date: 12/29/2021 ?Fluoroscopy was utilized by the requesting physician.  No radiographic interpretation.  ? ?DG C-Arm 1-60 Min-No Report ? ?Result Date: 12/29/2021 ?Fluoroscopy was utilized by the requesting physician.  No radiographic interpretation.   ? ?Anti-infectives: ?Anti-infectives (From admission, onward)  ? ? Start     Dose/Rate Route Frequency Ordered Stop  ? 12/29/21 1600  ceFAZolin (ANCEF) IVPB 2g/100 mL premix       ?  2 g ?200 mL/hr over 30 Minutes Intravenous Every 8 hours 12/29/21 1300 12/30/21 0848  ? 12/29/21 1000  ceFAZolin (ANCEF) IVPB 2g/100 mL premix       ? 2 g ?200 mL/hr over 30 Minutes Intravenous To [facility] 12/28/21 1611 12/29/21 0915  ? 12/27/21 1800  ceFAZolin (ANCEF) IVPB 2g/100 mL premix       ? 2 g ?200 mL/hr over 30 Minutes Intravenous  Once 12/27/21 1746 12/27/21 1836  ? ?  ? ? ?Assessment/Plan: ?Problem List: ?Patient Active Problem List  ? Diagnosis Date Noted  ? Closed displaced trimalleolar fracture of right ankle 12/30/2021  ? Ankle dislocation, right, initial encounter 12/30/2021  ? Ankle syndesmosis disruption, right, initial encounter 12/30/2021  ? Scapula fracture 12/27/2021  ? Persistent atrial fibrillation (HCC) 04/12/2021  ? Paroxysmal atrial fibrillation (HCC) 01/19/2021  ? ? ?Huge tree secondary trunk fell on the patient.  Multiple fractures, abrasions and contusions.  Hypokalemia-slight-will address with oral.  BP elevation noted and may be related to pain ?2 Days Post-Op  ? ? LOS: 4 days  ? ?Matt B. Daphine DeutscherMartin, MD, FACS ? ?[facility] ?[phone] to reach the surgeon on call.   ? ?12/31/2021 9:19 AM  ?

## 2021-12-31 NOTE — Therapy (Signed)
Occupational Therapy Treatment ?Patient Details ?Name: Terri Drake ?MRN: [id number] ?DOB: 02/19/1961 ?Today's Date: 12/31/2021 ? ? ?History of present illness Pt is 61 yr old F admitted on 12/27/21 with obvious ankle deformity after being struck by tree branch. Imaging (+) for R trimalleolar fx; displaced L inferior scapular fx; L 2nd + 6th rib fx; possible T11-12 fx. Underwent R ankle ORIF on 5/11. PMH: a-fib ?  ?OT comments ? 61 yo female admitted for injuries sustained when a large tree branch struck her. Seen for co-tx with PT to ensure pt/therapist safety and progress OOB activity. Significant improvement in tolerance for activity this date with pt able to progress to performing grooming task while seated EOB and participate in LUE exercises while seated in chair. She continues to report pain in L shoulder limiting mobility and indep with tasks. Did report some dizziness following transfer to bedside chair however vitals WNL and dizziness subsides after sitting for a few minutes. She is very motivated to continue to work with therapy to progress indep. Will continue to follow acutely.  ? ?Recommendations for follow up therapy are one component of a multi-disciplinary discharge planning process, led by the attending physician.  Recommendations may be updated based on patient status, additional functional criteria and insurance authorization. ?   ?Follow Up Recommendations ? Acute inpatient rehab (3hours/day)  ?  ?Assistance Recommended at Discharge Set up Supervision/Assistance  ?Patient can return home with the following ? Two people to help with walking and/or transfers;A lot of help with bathing/dressing/bathroom;Assist for transportation ?  ?Equipment Recommendations ? BSC/3in1;Wheelchair (measurements OT);Wheelchair cushion (measurements OT)  ?  ?Recommendations for Other Services Rehab consult ? ?  ?Precautions / Restrictions Precautions ?Precautions: Fall ?Restrictions ?Weight Bearing Restrictions:  Yes ?LUE Weight Bearing: Weight bearing as tolerated ?RLE Weight Bearing: Non weight bearing  ? ? ?  ? ?Mobility Bed Mobility ?Overal bed mobility: Needs Assistance ?Bed Mobility: Supine to Sit ?  ?  ?Supine to sit: Min assist ?  ?  ?  ?  ? ?  ?   ? ?ADL either performed or assessed with clinical judgement  ? ?ADL   ?  ?  ?Grooming: Brushing hair;Minimal assistance;Sitting;Min guard ?  ?  ?  ?  ?  ?  ?  ?Lower Body Dressing: Bed level;Moderate assistance (donning L sock unassisted) ?  ?  ?  ?  ?  ?  ?  ?  ?  ?  ? ? ? ?Cognition Arousal/Alertness: Awake/alert ?  ?  ?  ?  ?  ?  ?  ?  ?  ?   ?Exercises Shoulder Exercises ?Shoulder Flexion: AAROM, Self ROM, Seated, 10 reps ?Shoulder ABduction: AAROM, 10 reps, Seated ?Elbow Flexion: AROM, 15 reps, Seated ?Elbow Extension: 15 reps, AROM, Seated ?Wrist Flexion: AROM, 15 reps, Seated ?Wrist Extension: AROM, 15 reps, Seated ?Hand Exercises ?Forearm Supination: AROM, 10 reps ?Forearm Pronation: AROM, 10 reps ? ?  ?   ?   ? ? ?Pertinent Vitals/ Pain       Pain Assessment ?Pain Assessment: Faces ?Faces Pain Scale: Hurts even more ?Pain Descriptors / Indicators: Aching, Discomfort, Crying, Stabbing ?Pain Intervention(s): Limited activity within patient's tolerance, Ice applied, Patient requesting pain meds-RN notified, Repositioned, Monitored during session ? ?   ?   ? ?Frequency ? Min 2X/week  ? ? ? ? ?  ?Progress Toward Goals ? ?OT Goals(current goals can now be found in the care plan section) ? Progress towards OT goals: Progressing toward  goals ? ?Acute Rehab OT Goals ?Patient Stated Goal: Increase indep with mobility and ADLs ?OT Goal Formulation: With patient ?Potential to Achieve Goals: Good  ?Plan Discharge plan remains appropriate   ? ?Co-evaluation ? ? ? PT/OT/SLP Co-Evaluation/Treatment: Yes ?Reason for Co-Treatment: For patient/therapist safety;To address functional/ADL transfers ?  ?OT goals addressed during session: ADL's and self-care;Strengthening/ROM ?  ? ?   ?AM-PAC OT "6 Clicks" Daily Activity     ?Outcome Measure ? ? Help from another person eating meals?: A Little ?Help from another person taking care of personal grooming?: A Little ?Help from another person toileting, which includes using toliet, bedpan, or urinal?: Total ?Help from another person bathing (including washing, rinsing, drying)?: Total ?Help from another person to put on and taking off regular upper body clothing?: A Lot ?Help from another person to put on and taking off regular lower body clothing?: A Lot ?6 Click Score: 12 ? ?  ?End of Session Equipment Utilized During Treatment: Rolling walker (2 wheels);Gait belt;Other (comment) (LUE platform attachment) ? ?OT Visit Diagnosis: Unsteadiness on feet (R26.81);Muscle weakness (generalized) (M62.81) ?  ?Activity Tolerance Patient tolerated treatment well ?  ?Patient Left in chair;with call bell/phone within reach ?  ?Nurse Communication Mobility status ?  ? ?   ? ?Time: 6283-6629 ?OT Time Calculation (min): 36 min ? ?Charges: OT General Charges ?$OT Visit: 1 Visit ?OT Treatments ?$Self Care/Home Management : 8-22 mins ?$Therapeutic Exercise: 8-22 mins ? ? ? ?Daryl Eastern, OTR/L ?12/31/2021, 4:00 PM ? ? ?

## 2021-12-31 NOTE — Progress Notes (Signed)
Physical Therapy Treatment ?Patient Details ?Name: Terri Drake ?MRN: [id number] ?DOB: [birth year] ?Today's Date: 12/31/2021 ? ? ?History of Present Illness Pt is 61 yr old F admitted on 12/27/21 with obvious ankle deformity after being struck by tree branch. Imaging (+) for R trimalleolar fx; displaced L inferior scapular fx; L 2nd + 6th rib fx; possible T11-12 fx. Underwent R ankle ORIF on 5/11. PMH: a-fib ? ?  ?PT Comments  ? ? Patient progressing this session able to tolerate up OOB to chair.  Dawning on her the extent of her injuries but moving with 2 A for safety with pain well controlled.  She reports history of L foot pain so hopping on one foot may be limited in addition to back and shoulder injuries making it difficult to hop.  May trial knee walker, but likely wheelchair will be needed for longer distances.  PT will continue to follow acutely.  Continue to feel she may benefit from short CIR stay prior to d/c home.   ?Recommendations for follow up therapy are one component of a multi-disciplinary discharge planning process, led by the attending physician.  Recommendations may be updated based on patient status, additional functional criteria and insurance authorization. ? ?Follow Up Recommendations ? Acute inpatient rehab (3hours/day) ?  ?  ?Assistance Recommended at Discharge Frequent or constant Supervision/Assistance  ?Patient can return home with the following A lot of help with walking and/or transfers;A lot of help with bathing/dressing/bathroom;Assist for transportation;Assistance with cooking/housework;Help with stairs or ramp for entrance ?  ?Equipment Recommendations ? Rolling walker (2 wheels);Wheelchair (measurements PT);BSC/3in1 (RW with platform)  ?  ?Recommendations for Other Services   ? ? ?  ?Precautions / Restrictions Precautions ?Precautions: Fall ?Required Braces or Orthoses: Sling;Other Brace ?Other Brace: LSO for OOB, sling for comfort (allowed to move LUE) ?Restrictions ?Weight  Bearing Restrictions: Yes ?LUE Weight Bearing: Weight bearing as tolerated ?RLE Weight Bearing: Non weight bearing  ?  ? ?Mobility ? Bed Mobility ?Overal bed mobility: Needs Assistance ?Bed Mobility: Supine to Sit ?  ?  ?Supine to sit: Mod assist ?  ?  ?General bed mobility comments: HOB elevated and pt with assist to guide legs to side of bed and lift trunk ?  ? ?Transfers ?Overall transfer level: Needs assistance ?Equipment used: Left platform walker ?Transfers: Sit to/from Stand, Bed to chair/wheelchair/BSC ?Sit to Stand: Min guard, +2 safety/equipment ?Stand pivot transfers: Mod assist, +2 physical assistance ?  ?  ?  ?  ?General transfer comment: stood up maintaining R NWB with cues for hand placement, initiating "shimmy" steps to chair, but had to return to sit due to L foot pain, donned shoe and second attempt able to take 2 "hop" steps with increased pain so chair brought up for her to sit ?  ? ?Ambulation/Gait ?  ?  ?  ?  ?  ?  ?  ?  ? ? ?Stairs ?  ?  ?  ?  ?  ? ? ?Wheelchair Mobility ?  ? ?Modified Rankin (Stroke Patients Only) ?  ? ? ?  ?Balance Overall balance assessment: Needs assistance ?Sitting-balance support: No upper extremity supported ?Sitting balance-Leahy Scale: Good ?Sitting balance - Comments: brace donned sitting EOB ?  ?Standing balance support: Bilateral upper extremity supported ?Standing balance-Leahy Scale: Poor ?  ?  ?  ?  ?  ?  ?  ?  ?  ?  ?  ?  ?  ? ?  ?Cognition Arousal/Alertness: Awake/alert ?Behavior During Therapy: [facility]  for tasks assessed/performed ?Overall Cognitive Status: Within Functional Limits for tasks assessed ?  ?  ?  ?  ?  ?  ?  ?  ?  ?  ?  ?  ?  ?  ?  ?  ?  ?  ?  ? ?  ?Exercises   ? ?  ?General Comments General comments (skin integrity, edema, etc.): Encouragement throughout for technique, how nursing will assist back to bed and process of recovery/rehab ?  ?  ? ?Pertinent Vitals/Pain Pain Assessment ?Faces Pain Scale: Hurts even more ?Pain Location: L scapula ?Pain  Descriptors / Indicators: Aching, Grimacing, Moaning ?Pain Intervention(s): Monitored during session, Repositioned, Limited activity within patient's tolerance, Premedicated before session  ? ? ?Home Living   ?  ?  ?  ?  ?  ?  ?  ?  ?  ?   ?  ?Prior Function    ?  ?  ?   ? ?PT Goals (current goals can now be found in the care plan section) Progress towards PT goals: Progressing toward goals ? ?  ?Frequency ? ? ? Min 5X/week ? ? ? ?  ?PT Plan Current plan remains appropriate  ? ? ?Co-evaluation PT/OT/SLP Co-Evaluation/Treatment: Yes ?Reason for Co-Treatment: For patient/therapist safety;To address functional/ADL transfers ?PT goals addressed during session: Mobility/safety with mobility;Balance;Proper use of DME ?OT goals addressed during session: ADL's and self-care;Strengthening/ROM ?  ? ?  ?AM-PAC PT "6 Clicks" Mobility   ?Outcome Measure ? Help needed turning from your back to your side while in a flat bed without using bedrails?: A Lot ?Help needed moving from lying on your back to sitting on the side of a flat bed without using bedrails?: A Lot ?Help needed moving to and from a bed to a chair (including a wheelchair)?: Total ?Help needed standing up from a chair using your arms (e.g., wheelchair or bedside chair)?: A Lot ?Help needed to walk in hospital room?: Total ?Help needed climbing 3-5 steps with a railing? : Total ?6 Click Score: 9 ? ?  ?End of Session Equipment Utilized During Treatment: Gait belt ?Activity Tolerance: Patient tolerated treatment well ?Patient left: in chair;with call bell/phone within reach;with chair alarm set ?Nurse Communication: Mobility status;Need for lift equipment ?PT Visit Diagnosis: Pain;Other abnormalities of gait and mobility (R26.89) ?Pain - Right/Left: Left ?Pain - part of body: Shoulder ?  ? ? ?Time: [id number] ?PT Time Calculation (min) (ACUTE ONLY): 27 min ? ?Charges:  $Therapeutic Activity: 8-22 mins          ?          ? ?Magda Kiel, PT ?Acute Rehabilitation  Services ?[id number] ?Office:[phone] ?12/31/2021 ? ? ? ?Reginia Naas ?12/31/2021, 6:01 PM ? ?

## 2022-01-01 MED ORDER — METOPROLOL TARTRATE 5 MG/5ML IV SOLN
5.0000 mg | Freq: Once | INTRAVENOUS | Status: AC
  Administered 2022-01-01: 5 mg via INTRAVENOUS
  Filled 2022-01-01: qty 5

## 2022-01-01 MED ORDER — METOPROLOL TARTRATE 5 MG/5ML IV SOLN
5.0000 mg | INTRAVENOUS | Status: DC | PRN
  Filled 2022-01-01 (×2): qty 5

## 2022-01-01 MED ORDER — METOPROLOL TARTRATE 5 MG/5ML IV SOLN: 5.0000 mg | INTRAVENOUS | Status: DC

## 2022-01-01 MED ORDER — KETOROLAC TROMETHAMINE 15 MG/ML IJ SOLN
15.0000 mg | Freq: Four times a day (QID) | INTRAMUSCULAR | Status: DC | PRN
  Administered 2022-01-01: 15 mg via INTRAVENOUS
  Filled 2022-01-01: qty 1

## 2022-01-01 MED ORDER — METOPROLOL SUCCINATE ER 25 MG PO TB24
25.0000 mg | ORAL_TABLET | Freq: Every day | ORAL | Status: DC
Start: 2022-01-02 — End: 2022-01-03
  Administered 2022-01-02 – 2022-01-03 (×2): 25 mg via ORAL
  Filled 2022-01-01 (×2): qty 1

## 2022-01-01 NOTE — Progress Notes (Signed)
Physical Therapy Treatment ?Patient Details ?Name: Terri Drake ?MRN: [id number] ?DOB: September 02, 1960 ?Today's Date: 01/01/2022 ? ? ?History of Present Illness Pt is 61 yr old F admitted on 12/27/21 with obvious ankle deformity after being struck by tree branch. Imaging (+) for R trimalleolar fx; displaced L inferior scapular fx; L 2nd + 6th rib fx; possible T11-12 fx. Underwent R ankle ORIF on 5/11. PMH: a-fib ? ?  ?PT Comments  ? ? The pt was agreeable to session with continued focus on progressing OOB mobility and activity tolerance. She was able to complete x6 sit-stand transfers with static stance for up to 3 min at at time with BUE support on RW. The pt demos good ability to maintain NWB RLE, but continues to need max cues for technique to attempt heel-toe pivotal movements or hops. The pt reports she is limited by pain in L scapula with attempts to hop at this time, will continue to benefit from skilled PT acutely to progress mobility and strength, continues to be great candidate for acute inpatient rehab to maximize functional recovery and independence.  ?  ?Recommendations for follow up therapy are one component of a multi-disciplinary discharge planning process, led by the attending physician.  Recommendations may be updated based on patient status, additional functional criteria and insurance authorization. ? ?Follow Up Recommendations ? Acute inpatient rehab (3hours/day) ?  ?  ?Assistance Recommended at Discharge Frequent or constant Supervision/Assistance  ?Patient can return home with the following A lot of help with walking and/or transfers;A lot of help with bathing/dressing/bathroom;Assist for transportation;Assistance with cooking/housework;Help with stairs or ramp for entrance ?  ?Equipment Recommendations ? Rolling walker (2 wheels);Wheelchair (measurements PT);BSC/3in1 (L platform RW)  ?  ?Recommendations for Other Services   ? ? ?  ?Precautions / Restrictions Precautions ?Precautions:  Fall ?Precaution Comments: watch BP ?Required Braces or Orthoses: Sling;Other Brace ?Other Brace: LSO for OOB, sling for comfort (allowed to move LUE) ?Restrictions ?Weight Bearing Restrictions: Yes ?LUE Weight Bearing: Weight bearing as tolerated ?RLE Weight Bearing: Non weight bearing  ?  ? ?Mobility ? Bed Mobility ?Overal bed mobility: Needs Assistance ?  ?  ?  ?  ?  ?  ?General bed mobility comments: pt OOB in recliner at start and end of session ?  ? ?Transfers ?Overall transfer level: Needs assistance ?Equipment used: Left platform walker ?Transfers: Sit to/from Stand ?Sit to Stand: Mod assist, Min guard, +2 physical assistance ?  ?  ?  ?  ?  ?General transfer comment: pt completed x6 sit-stand transfers from recliner with progression from modA to stand to minG with reps. standing for 3 min on first stand and then attempting to initiate hop/steps on subsequent trials. reports pain in L scapula is limiting factor. BP stable but pt reports dizziness. only able to move with heel--toe wiggle ?  ? ?Ambulation/Gait ?  ?  ?  ?  ?  ?  ?  ?General Gait Details: unable to progress to hopping due to pain ? ? ? ?  ?Balance Overall balance assessment: Needs assistance ?Sitting-balance support: No upper extremity supported ?Sitting balance-Leahy Scale: Good ?Sitting balance - Comments: brace donned sitting EOB ?  ?Standing balance support: Bilateral upper extremity supported ?Standing balance-Leahy Scale: Poor ?Standing balance comment: dependent on BUE support due to NWB RLE ?  ?  ?  ?  ?  ?  ?  ?  ?  ?  ?  ?  ? ?  ?Cognition Arousal/Alertness: Awake/alert ?Behavior During Therapy: [facility]  for tasks assessed/performed ?Overall Cognitive Status: Within Functional Limits for tasks assessed ?  ?  ?  ?  ?  ?  ?  ?  ?  ?  ?  ?  ?  ?  ?  ?  ?  ?  ?  ? ?  ?Exercises Other Exercises ?Other Exercises: verbally reviewed heel raises and LAQ to be completed in sitting, heel slides, SLR, and bridging with LLE to be performed in supine in  bed ? ?  ?General Comments General comments (skin integrity, edema, etc.): VSS on RA, pt reports feeliing dizzy, BP 129/71 (87) after standing, 139/55 (80) in standing, and 137/90 (99) sitting after session ?  ?  ? ?Pertinent Vitals/Pain Pain Assessment ?Pain Assessment: Faces ?Faces Pain Scale: Hurts even more ?Pain Location: L scapula ?Pain Descriptors / Indicators: Aching, Grimacing, Moaning ?Pain Intervention(s): Limited activity within patient's tolerance, Monitored during session, Premedicated before session, Repositioned  ? ? ? ?PT Goals (current goals can now be found in the care plan section) Acute Rehab PT Goals ?Patient Stated Goal: Pt's goal is to return to independence and return home. ?PT Goal Formulation: With patient/family ?Time For Goal Achievement: 01/13/22 ?Potential to Achieve Goals: Good ?Progress towards PT goals: Progressing toward goals ? ?  ?Frequency ? ? ? Min 5X/week ? ? ? ?  ?PT Plan Current plan remains appropriate  ? ? ?   ?AM-PAC PT "6 Clicks" Mobility   ?Outcome Measure ? Help needed turning from your back to your side while in a flat bed without using bedrails?: A Lot ?Help needed moving from lying on your back to sitting on the side of a flat bed without using bedrails?: A Lot ?Help needed moving to and from a bed to a chair (including a wheelchair)?: Total ?Help needed standing up from a chair using your arms (e.g., wheelchair or bedside chair)?: A Lot ?Help needed to walk in hospital room?: Total ?Help needed climbing 3-5 steps with a railing? : Total ?6 Click Score: 9 ? ?  ?End of Session Equipment Utilized During Treatment: Gait belt ?Activity Tolerance: Patient tolerated treatment well ?Patient left: in chair;with call bell/phone within reach;with chair alarm set ?Nurse Communication: Mobility status;Need for lift equipment ?PT Visit Diagnosis: Pain;Other abnormalities of gait and mobility (R26.89) ?Pain - Right/Left: Left ?Pain - part of body: Shoulder ?  ? ? ?Time:  9675-9163 ?PT Time Calculation (min) (ACUTE ONLY): 53 min ? ?Charges:  $Gait Training: 8-22 mins ?$Therapeutic Exercise: 23-37 mins ?$Therapeutic Activity: 8-22 mins          ?          ? ?Vickki Muff, PT, DPT  ? ?Acute Rehabilitation Department ?Pager #: [phone] ? ? ?Ronnie Derby ?01/01/2022, 3:30 PM ? ?

## 2022-01-01 NOTE — Plan of Care (Signed)
  Problem: Pain Managment: Goal: General experience of comfort will improve Outcome: Progressing   Problem: Safety: Goal: Ability to remain free from injury will improve Outcome: Progressing   

## 2022-01-01 NOTE — Progress Notes (Signed)
Patient ID: Terri Drake, female   DOB: 1960/12/05, 61 y.o.   MRN: [id number] ?[facility] Progress Note:   3 Days Post-Op  ?Subjective: ?Mental status is clear.  Complaints some throbbing in her right foot after being up. ?Objective: ?Vital signs in last 24 hours: ?Temp:  [97.8 ?F (36.6 ?C)-99 ?F (37.2 ?C)] 97.8 ?F (36.6 ?C) (05/14 4650) ?Pulse Rate:  [68-72] 71 (05/14 0817) ?Resp:  [18] 18 (05/14 0817) ?BP: (149-158)/(65-78) 149/74 (05/14 0817) ?SpO2:  [96 %-100 %] 96 % (05/14 0817) ? ?Intake/Output from previous day: ?05/13 0701 - 05/14 0700 ?In: 300 [P.O.:300] ?Out: 1500 [Urine:1500] ?Intake/Output this shift: ?No intake/output data recorded. ? ?Physical Exam: Work of breathing is normal.  Able to move the left arm more-more mobility.  Right toes with good color.   ? ?Lab Results:  ?Results for orders placed or performed during the hospital encounter of 12/27/21 (from the past 48 hour(s))  ?VITAMIN D 25 Hydroxy (Vit-D Deficiency, Fractures)     Status: Abnormal  ? Collection Time: 12/31/21  1:29 AM  ?Result Value Ref Range  ? Vit D, 25-Hydroxy 17.60 (L) 30 - 100 ng/mL  ?  Comment: (NOTE) ?Vitamin D deficiency has been defined by the Institute of Medicine  ?and an Endocrine Society practice guideline as a level of serum 25-OH  ?vitamin D less than 20 ng/mL (1,2). The Endocrine Society went on to  ?further define vitamin D insufficiency as a level between 21 and 29  ?ng/mL (2). ? ?1. IOM [employer] of Medicine). 2010. Dietary reference intakes for  ?calcium and D. [place]: [employer]. ?2. Holick MF, Binkley [place], Bischoff-Ferrari HA, et al. Evaluation,  ?treatment, and prevention of vitamin D deficiency: an Endocrine  ?Society clinical practice guideline, JCEM. 2011 Jul; 96(7): 1911-30. ? ?Performed at [facility], [address] [place], [place] ?[address] ?  ?CBC     Status: Abnormal  ? Collection Time: 12/31/21  1:29 AM  ?Result Value Ref Range  ? WBC 7.4 4.0 - 10.5  K/uL  ? RBC 3.13 (L) 3.87 - 5.11 MIL/uL  ? Hemoglobin 10.7 (L) 12.0 - 15.0 g/dL  ? HCT 30.3 (L) 36.0 - 46.0 %  ? MCV 96.8 80.0 - 100.0 fL  ? MCH 34.2 (H) 26.0 - 34.0 pg  ? MCHC 35.3 30.0 - 36.0 g/dL  ? RDW 12.5 11.5 - 15.5 %  ? Platelets 157 150 - 400 K/uL  ? nRBC 0.0 0.0 - 0.2 %  ?  Comment: Performed at [facility], [address], [place], [place] [address]  ?Basic metabolic panel     Status: Abnormal  ? Collection Time: 12/31/21  1:29 AM  ?Result Value Ref Range  ? Sodium 135 135 - 145 mmol/L  ? Potassium 3.4 (L) 3.5 - 5.1 mmol/L  ? Chloride 105 98 - 111 mmol/L  ? CO2 21 (L) 22 - 32 mmol/L  ? Glucose, Bld 96 70 - 99 mg/dL  ?  Comment: Glucose reference range applies only to samples taken after fasting for at least 8 hours.  ? BUN 8 8 - 23 mg/dL  ? Creatinine, Ser 0.74 0.44 - 1.00 mg/dL  ? Calcium 8.0 (L) 8.9 - 10.3 mg/dL  ? GFR, Estimated >60 >60 mL/min  ?  Comment: (NOTE) ?Calculated using the CKD-EPI Creatinine Equation (2021) ?  ? Anion gap 9 5 - 15  ?  Comment: Performed at [facility], [address],  [place], [place] [address]  ? ? ?Radiology/Results: ?No results found. ? ?Anti-infectives: ?Anti-infectives (From admission, onward)  ? ? Start     Dose/Rate Route Frequency Ordered Stop  ? 12/29/21 1600  ceFAZolin (ANCEF) IVPB 2g/100 mL premix       ? 2 g ?200 mL/hr over 30 Minutes Intravenous Every 8 hours 12/29/21 1300 12/30/21 0848  ? 12/29/21 1000  ceFAZolin (ANCEF) IVPB 2g/100 mL premix       ? 2 g ?200 mL/hr over 30 Minutes Intravenous To ShortStay Surgical 12/28/21 1611 12/29/21 0915  ? 12/27/21 1800  ceFAZolin (ANCEF) IVPB 2g/100 mL premix       ? 2 g ?200 mL/hr over 30 Minutes Intravenous  Once 12/27/21 1746 12/27/21 1836  ? ?  ? ? ?Assessment/Plan: ?Problem List: ?Patient Active Problem List  ? Diagnosis Date Noted  ? Closed displaced trimalleolar fracture of right ankle 12/30/2021  ? Ankle dislocation, right, initial encounter 12/30/2021  ? Ankle syndesmosis disruption, right, initial  encounter 12/30/2021  ? Scapula fracture 12/27/2021  ? Persistent atrial fibrillation (HCC) 04/12/2021  ? Paroxysmal atrial fibrillation (HCC) 01/19/2021  ? ? ?Will change Toradol to PRN.  Making improvements in mobility with PT.   ?3 Days Post-Op  ? ? LOS: 5 days  ? ?Matt B. Daphine Deutscher, MD, FACS ? ?[facility] ?[phone] to reach the surgeon on call.   ? ?01/01/2022 9:11 AM  ?

## 2022-01-02 ENCOUNTER — Encounter (HOSPITAL_COMMUNITY): Payer: Self-pay | Admitting: Orthopedic Surgery

## 2022-01-02 ENCOUNTER — Inpatient Hospital Stay (HOSPITAL_COMMUNITY): Payer: No Typology Code available for payment source

## 2022-01-02 ENCOUNTER — Other Ambulatory Visit: Payer: Self-pay

## 2022-01-02 DIAGNOSIS — I480 Paroxysmal atrial fibrillation: Secondary | ICD-10-CM

## 2022-01-02 LAB — ECHOCARDIOGRAM COMPLETE
Area-P 1/2: 2.77 cm2
Height: 67 in
S' Lateral: 2.4 cm
Weight: 3056 oz

## 2022-01-02 LAB — BASIC METABOLIC PANEL
Anion gap: 9 (ref 5–15)
BUN: 8 mg/dL (ref 8–23)
CO2: 20 mmol/L — ABNORMAL LOW (ref 22–32)
Calcium: 9 mg/dL (ref 8.9–10.3)
Chloride: 108 mmol/L (ref 98–111)
Creatinine, Ser: 0.64 mg/dL (ref 0.44–1.00)
GFR, Estimated: >60 mL/min (ref >60)
Glucose, Bld: 121 mg/dL — ABNORMAL HIGH (ref 70–99)
Potassium: 4.3 mmol/L (ref 3.5–5.1)
Sodium: 137 mmol/L (ref 135–145)

## 2022-01-02 LAB — CBC
HCT: 37 % (ref 36.0–46.0)
Hemoglobin: 13.5 g/dL (ref 12.0–15.0)
MCH: 35 pg — ABNORMAL HIGH (ref 26.0–34.0)
MCHC: 36.5 g/dL — ABNORMAL HIGH (ref 30.0–36.0)
MCV: 95.9 fL (ref 80.0–100.0)
Platelets: 262 10*3/uL (ref 150–400)
RBC: 3.86 MIL/uL — ABNORMAL LOW (ref 3.87–5.11)
RDW: 12.8 % (ref 11.5–15.5)
WBC: 7.6 10*3/uL (ref 4.0–10.5)
nRBC: 0 % (ref 0.0–0.2)

## 2022-01-02 MED ORDER — FLECAINIDE ACETATE 50 MG PO TABS
50.0000 mg | ORAL_TABLET | Freq: Once | ORAL | Status: AC
  Administered 2022-01-02: 50 mg via ORAL
  Filled 2022-01-02: qty 1

## 2022-01-02 MED ORDER — AMIODARONE IV BOLUS ONLY 150 MG/100ML
150.0000 mg | Freq: Once | INTRAVENOUS | Status: DC
  Filled 2022-01-02: qty 100

## 2022-01-02 MED ORDER — FLECAINIDE ACETATE 100 MG PO TABS
100.0000 mg | ORAL_TABLET | Freq: Two times a day (BID) | ORAL | Status: DC
  Administered 2022-01-02 – 2022-01-04 (×4): 100 mg via ORAL
  Filled 2022-01-02 (×5): qty 1

## 2022-01-02 MED ORDER — SODIUM CHLORIDE 0.9 % IV SOLN: INTRAVENOUS | Status: DC

## 2022-01-02 MED ORDER — METOPROLOL TARTRATE 5 MG/5ML IV SOLN
5.0000 mg | INTRAVENOUS | Status: DC | PRN
  Administered 2022-01-03: 5 mg via INTRAVENOUS
  Filled 2022-01-02: qty 5

## 2022-01-02 MED ORDER — POLYETHYLENE GLYCOL 3350 17 G PO PACK
17.0000 g | PACK | Freq: Two times a day (BID) | ORAL | Status: DC
  Administered 2022-01-02: 17 g via ORAL
  Filled 2022-01-02: qty 1

## 2022-01-02 MED ORDER — SODIUM CHLORIDE 0.9 % IV BOLUS
500.0000 mL | Freq: Once | INTRAVENOUS | Status: AC
  Administered 2022-01-02: 500 mL via INTRAVENOUS

## 2022-01-02 NOTE — Progress Notes (Signed)
At approx 1448  Pt had been in afib with a rate of 120's, within few minutes rate down to 70-80's. And within 10 minutes  (at 1457) rate back at 120-130's afib .  Cardiology NP Nada Boozer up on the unit to view telemetry and informed of the am event. Also informed of the order written by trauma MD for pt to have IV bolus of amiodarone.  (Med had not been given as of yet due to this unit does not given amio IV, rapid response had been called to look into this, with cardiology NP on unit to assess pt and her telemetry nurse was instructed to hold off on giving any amiodarone).  ? ?New orders written after pt seen by cards NP.  ?

## 2022-01-02 NOTE — Progress Notes (Addendum)
4 Days Post-Op  Subjective: CC: Upset about the weekend. Tearful this morning.  Notes she is normally on Metoprolol 25mg  at home. Reviewed last cardiology note and discussed it says she is on 12.5mg  daily. She reports phone encounter where this was increased. It appears this was already been changed yesterday. A. Fib/Tachy yesterday int. HR reg rate this AM Concerned she still has foley in place Discussed pain control and labs  Otherwise doing well. Tolerating diet without n/v. Passing flatus. Last BM Friday. On RA. Moving left elbow better and no n/t in left hand. Progressing with PT. Did x6 sit-stand transfers with static stance for up to 3 min at at time with BUE support on RW. Discussed CIR recommendations which patient and husband. He could provide 24/7 support after d/c.   Objective: Vital signs in last 24 hours: Temp:  [97.9 F (36.6 C)-98.4 F (36.9 C)] 98.2 F (36.8 C) (05/15 0601) Pulse Rate:  [64-135] 76 (05/15 0601) Resp:  [17-20] 18 (05/15 0601) BP: (146-168)/(57-117) 157/57 (05/15 0601) SpO2:  [96 %-98 %] 96 % (05/15 0601) Last BM Date : 12/30/21  Intake/Output from previous day: 05/14 0701 - 05/15 0700 In: -  Out: 4000 [Urine:4000] Intake/Output this shift: No intake/output data recorded.  PE: Gen:  Alert, NAD, pleasant HEENT: EOM's intact, pupils equal and round Card:  RRR Pulm:  CTAB, no W/R/R, effort normal Abd: Soft, ND, NT, +BS GU: Foley in place w/ straw colored urine Ext: Splint to RLE. WWP. Able to wiggle toes. No LLE edema. Moves BUE's Psych: A&Ox3  Skin: no rashes noted, warm and dry  Lab Results:  Recent Labs    12/31/21 0129  WBC 7.4  HGB 10.7*  HCT 30.3*  PLT 157   BMET Recent Labs    12/31/21 0129  NA 135  K 3.4*  CL 105  CO2 21*  GLUCOSE 96  BUN 8  CREATININE 0.74  CALCIUM 8.0*   PT/INR No results for input(s): LABPROT, INR in the last 72 hours. CMP     Component Value Date/Time   NA 135 12/31/2021 0129   K 3.4 (L)  12/31/2021 0129   CL 105 12/31/2021 0129   CO2 21 (L) 12/31/2021 0129   GLUCOSE 96 12/31/2021 0129   BUN 8 12/31/2021 0129   CREATININE 0.74 12/31/2021 0129   CALCIUM 8.0 (L) 12/31/2021 0129   PROT 6.2 (L) 12/27/2021 1746   ALBUMIN 3.9 12/27/2021 1746   AST 32 12/27/2021 1746   ALT 25 12/27/2021 1746   ALKPHOS 62 12/27/2021 1746   BILITOT 0.6 12/27/2021 1746   GFRNONAA >60 12/31/2021 0129   Lipase  No results found for: LIPASE  Studies/Results: No results found.  Anti-infectives: Anti-infectives (From admission, onward)    Start     Dose/Rate Route Frequency Ordered Stop   12/29/21 1600  ceFAZolin (ANCEF) IVPB 2g/100 mL premix        2 g 200 mL/hr over 30 Minutes Intravenous Every 8 hours 12/29/21 1300 12/30/21 0848   12/29/21 1000  ceFAZolin (ANCEF) IVPB 2g/100 mL premix        2 g 200 mL/hr over 30 Minutes Intravenous To ShortStay Surgical 12/28/21 1611 12/29/21 0915   12/27/21 1800  ceFAZolin (ANCEF) IVPB 2g/100 mL premix        2 g 200 mL/hr over 30 Minutes Intravenous  Once 12/27/21 1746 12/27/21 1836        Assessment/Plan 7F s/p hit by tree branch.  T11/12 endplate frx -  NSGY c/s, Dr. Kathyrn Sheriff. Lumbar corset for comfort when the patient is up. Therapies.  R ankle fx/dislocation- Reduced by EDP. Ortho c/s, Dr. Stann Mainland. Care transferred to Ortho trauma, Dr. Marcelino Scot - s/p ORIF 5/11. Recommended for NWB RLE 8 weeks. Splint x 2 weeks then covert to CAM for ROM exercises. 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 OT following.  L rib fx 2,6 - IS, pulm toilet, pain control.  L PTX - follow up CXR 5/10 without PTX. Continue IS and pulm toilet L scapula fx - ortho c/s, sling, WBAT LUE w/ ROM as tolerated  Abrasions to L shoulder and L knee - local wound care A fib - Home Flecainide and Metoprolol. PRN Metoprolol ABL  anemia - hgb stable 5/13 HTN - Toprolol. Improved. Monitor. PRN meds.  FEN - Gluten free diet, inc bowel regimen DVT - SCDs, LMWH. Per ortho needs Xarelto 10 mg at discharge ID - tdap and ancef given in ED Foley - placed for urinary retention. Urecholine (allergy to flomax). TOV today  Dispo - Therapies. CIR  Addendum: RN reports that patient became bradycardic and BP dropped this AM when oob with therapies. Vitals normalized after rest. PT note says "PT became pre syncopal with positive dizziness, pallor, and diaphoresis during transfer. BP 73/48, HR 48; reclined pt in chair. BP improved to 101/62, HR 60". Patient seen. States that she was standing when she began having dizziness, diaphoresis and felt like she was going to pass out. No cp or sob. Able to lower herself back into the chair. She is asymptomatic now. No n/t/w. Neuro exam non-focal (CN 3-12 grossly intact, SILT to UE's and LE's, good grip strength b/l, good hip flexion strength RLE (splint) and good LLE strength). HR tachycardic currently. Lungs CTA b/l. She did get increased dose of Metoprolol this am. Will get basic labs, orthostatics, cxr, ekg and continue cardiac monitoring. 500cc bolus followed by maintenance IVF. Reaching out to my attending to discuss.    LOS: 6 days    Jillyn Ledger , [facility] 01/02/2022, 8:24 AM Please see Amion for pager number during day hours 7:00am-4:30pm

## 2022-01-02 NOTE — Progress Notes (Incomplete)
Pt up with PT earlier around 1025 this am, pt HR and BP down and pt was symptomatic.  PT report to RN that BP was 70/40's and HR 48, but recovered to 101/62, 60 after over to chair. ? ?PT stated she would notify the PA/MD. At this time of approx 1130 no note in chart from PT and no indication that PA/MD aware of the above.   [employer], PA notified, he requested ortho statics and an EKG on pt. ? ?RN ordered the EKG, and orthos done and placed in the chart.  ?At 1145 108, 143/104 sitting  ?At 1148 105, 129/114 standing, has some dizziness coming on, did not keep pt standing on one foot any longer and let her sit back down.  PA informed.  Once EKG done, PA saw it and aware of pt in aflutter rate 132 at that time.     ?

## 2022-01-02 NOTE — Progress Notes (Addendum)
Physical Therapy Treatment ?Patient Details ?Name: Terri Drake ?MRN: [id number] ?DOB: 23-Feb-1961 ?Today's Date: 01/02/2022 ? ? ?History of Present Illness Pt is 61 yr old F admitted on 12/27/21 with obvious ankle deformity after being struck by tree branch. Imaging (+) for R trimalleolar fx; displaced L inferior scapular fx; L 2nd + 6th rib fx; possible T11-12 fx. Underwent R ankle ORIF on 5/11. PMH: a-fib ? ?  ?PT Comments  ? ? Pt pleasant and motivated to participate in PT session; premedicated prior to session. Reviewed precautions. Performed BLE and LUE ROM/strengthening exercises at bed level. Requiring min to mod assist to transfer from bed to chair using left platform RW. Pt became pre syncopal with positive dizziness, pallor, and diaphoresis during transfer. BP 73/48, HR 48; reclined pt in chair. BP improved to 101/62, HR 60 and RN notified. Will continue to progress as tolerated. Remains excellent candidate for AIR based on age, motivation and PLOF.  ?  ?Recommendations for follow up therapy are one component of a multi-disciplinary discharge planning process, led by the attending physician.  Recommendations may be updated based on patient status, additional functional criteria and insurance authorization. ? ?Follow Up Recommendations ? Acute inpatient rehab (3hours/day) ?  ?  ?Assistance Recommended at Discharge Frequent or constant Supervision/Assistance  ?Patient can return home with the following A lot of help with walking and/or transfers;A lot of help with bathing/dressing/bathroom;Assist for transportation;Assistance with cooking/housework;Help with stairs or ramp for entrance ?  ?Equipment Recommendations ? Rolling walker (2 wheels);Wheelchair (measurements PT);BSC/3in1 (L platform RW)  ?  ?Recommendations for Other Services   ? ? ?  ?Precautions / Restrictions Precautions ?Precautions: Fall;Back ?Precaution Comments: orthostatic hypotension ?Required Braces or Orthoses: Sling;Other Brace ?Other  Brace: LSO for OOB, sling for comfort (allowed to move LUE) ?Restrictions ?Weight Bearing Restrictions: Yes ?LUE Weight Bearing: Weight bearing as tolerated ?RLE Weight Bearing: Non weight bearing  ?  ? ?Mobility ? Bed Mobility ?Overal bed mobility: Needs Assistance ?Bed Mobility: Supine to Sit ?  ?  ?Supine to sit: Min guard ?  ?  ?General bed mobility comments: Use of RUE on bed rail, HOB elevated, increased time/effort. No physical assist required ?  ? ?Transfers ?Overall transfer level: Needs assistance ?Equipment used: Left platform walker ?Transfers: Sit to/from Stand, Bed to chair/wheelchair/BSC ?Sit to Stand: Min assist ?Stand pivot transfers: Mod assist ?  ?  ?  ?  ?General transfer comment: Pt requiring minA to power up, cues for foot and hand placement. Assist to guide LUE onto trough of platform walker. Pivoting towards right with modA, decreased eccentric control to sit + orthostasis ?  ? ?Ambulation/Gait ?  ?  ?  ?  ?  ?  ?  ?General Gait Details: unable to progress due to orthostasis ? ? ?Stairs ?  ?  ?  ?  ?  ? ? ?Wheelchair Mobility ?  ? ?Modified Rankin (Stroke Patients Only) ?  ? ? ?  ?Balance Overall balance assessment: Needs assistance ?Sitting-balance support: No upper extremity supported ?Sitting balance-Leahy Scale: Good ?  ?  ?Standing balance support: Bilateral upper extremity supported ?Standing balance-Leahy Scale: Poor ?Standing balance comment: dependent on BUE support due to NWB RLE ?  ?  ?  ?  ?  ?  ?  ?  ?  ?  ?  ?  ? ?  ?Cognition Arousal/Alertness: Awake/alert ?Behavior During Therapy: [facility] for tasks assessed/performed ?Overall Cognitive Status: Within Functional Limits for tasks assessed ?  ?  ?  ?  ?  ?  ?  ?  ?  ?  ?  ?  ?  ?  ?  ?  ?  ?  ?  ? ?  ?  Exercises General Exercises - Upper Extremity ?Shoulder Flexion: AAROM, Left, 10 reps, Seated ?General Exercises - Lower Extremity ?Quad Sets: Right, 10 reps, Supine ?Heel Slides: Both, 10 reps, Supine ?Straight Leg Raises: Both, 10  reps, Supine ?Other Exercises ?Other Exercises: LLE bridges x 5 ?Other Exercises: Sitting: LUE table slides ? ?  ?General Comments   ?  ?  ? ?Pertinent Vitals/Pain Pain Assessment ?Pain Assessment: Faces ?Faces Pain Scale: Hurts a little bit ?Pain Location: L scapula ?Pain Descriptors / Indicators: Aching, Grimacing ?Pain Intervention(s): Limited activity within patient's tolerance, Monitored during session, Premedicated before session  ? ? ?Home Living   ?  ?  ?  ?  ?  ?  ?  ?  ?  ?   ?  ?Prior Function    ?  ?  ?   ? ?PT Goals (current goals can now be found in the care plan section) Acute Rehab PT Goals ?Patient Stated Goal: Pt's goal is to return to independence and return home. ?PT Goal Formulation: With patient/family ?Time For Goal Achievement: 01/13/22 ?Potential to Achieve Goals: Good ?Progress towards PT goals: Progressing toward goals ? ?  ?Frequency ? ? ? Min 5X/week ? ? ? ?  ?PT Plan Current plan remains appropriate  ? ? ?Co-evaluation   ?  ?  ?  ?  ? ?  ?AM-PAC PT "6 Clicks" Mobility   ?Outcome Measure ? Help needed turning from your back to your side while in a flat bed without using bedrails?: A Little ?Help needed moving from lying on your back to sitting on the side of a flat bed without using bedrails?: A Little ?Help needed moving to and from a bed to a chair (including a wheelchair)?: A Lot ?Help needed standing up from a chair using your arms (e.g., wheelchair or bedside chair)?: A Little ?Help needed to walk in hospital room?: Total ?Help needed climbing 3-5 steps with a railing? : Total ?6 Click Score: 13 ? ?  ?End of Session Equipment Utilized During Treatment: Gait belt ?Activity Tolerance: Patient tolerated treatment well ?Patient left: in chair;with call bell/phone within reach;with chair alarm set ?Nurse Communication: Mobility status;Need for lift equipment ?PT Visit Diagnosis: Pain;Other abnormalities of gait and mobility (R26.89) ?Pain - Right/Left: Left ?Pain - part of body:  Shoulder ?  ? ? ?Time: [id number] ?PT Time Calculation (min) (ACUTE ONLY): 38 min ? ?Charges:  $Therapeutic Exercise: 8-22 mins ?$Therapeutic Activity: 23-37 mins          ?          ? ?Wyona Almas, PT, DPT ?Acute Rehabilitation Services ?Pager [phone] ?Office [phone] ? ? ? ?Carloine Margo Aye ?01/02/2022, 12:12 PM ? ?

## 2022-01-02 NOTE — Progress Notes (Signed)
? ?Progress Note ? ?Patient Name: Terri Drake ?Date of Encounter: 01/03/2022 ? ?[facility] Cardiologist: None  ? ?Subjective  ? ?Patient feels okay this morning. Slowly recovering. Had multiple runs of Afib with RVR overnight and this AM. Asymptomatic at that time.  ? ?Inpatient Medications  ?  ?Scheduled Meds: ? acetaminophen  1,000 mg Oral Q6H  ? vitamin C  500 mg Oral Daily  ? bacitracin   Topical BID  ? bethanechol  25 mg Oral TID  ? Chlorhexidine Gluconate Cloth  6 each Topical Daily  ? docusate sodium  100 mg Oral BID  ? enoxaparin (LOVENOX) injection  30 mg Subcutaneous Q12H  ? flecainide  100 mg Oral Q12H  ? latanoprost  1 drop Both Eyes QHS  ? lidocaine  1 patch Transdermal Q24H  ? methocarbamol  1,000 mg Oral QID  ? metoprolol succinate  25 mg Oral Daily  ? polyethylene glycol  17 g Oral BID  ? potassium chloride  20 mEq Oral BID  ? zinc sulfate  220 mg Oral Daily  ? ?Continuous Infusions: ? sodium chloride 100 mL/hr at 01/03/22 0618  ? ?PRN Meds: ?ketorolac, metoprolol tartrate, morphine injection, oxyCODONE  ? ?Vital Signs  ?  ?Vitals:  ? 01/02/22 1607 01/02/22 2011 01/03/22 0453 01/03/22 0747  ?BP: (!) 153/77 126/66 (!) 159/94 (!) 131/99  ?Pulse: 75 70 99 (!) 110  ?Resp: 18 16 16 16   ?Temp: 97.8 ?F (36.6 ?C) 98 ?F (36.7 ?C) 97.8 ?F (36.6 ?C) 97.8 ?F (36.6 ?C)  ?TempSrc: Oral Oral Oral Oral  ?SpO2: 98% 98% 96% 97%  ?Weight:      ?Height:      ? ? ?Intake/Output Summary (Last 24 hours) at 01/03/2022 0805 ?Last data filed at 01/03/2022 [id number] ?Gross per 24 hour  ?Intake 1576.58 ml  ?Output 3350 ml  ?Net -1773.42 ml  ? ? ?  12/27/2021  ?  5:48 PM 11/04/2021  ?  8:31 AM 08/05/2021  ?  8:28 AM  ?Last 3 Weights  ?Weight (lbs) 191 lb 194 lb 9.6 oz 190 lb  ?Weight (kg) 86.637 kg 88.27 kg 86.183 kg  ?   ? ?Telemetry  ?  ?Afib with RVR and NSR - Personally Reviewed ? ?ECG  ?  ?No new tracing - Personally Reviewed ? ?Physical Exam  ? ?GEN: No acute distress.   ?Neck: No JVD ?Cardiac: RRR, no murmurs, rubs, or  gallops.  ?Respiratory: Clear to auscultation bilaterally. ?GI: Soft, nontender, non-distended  ?MS: RLE splint in place ?Neuro:  Nonfocal  ?Psych: Normal affect  ? ?Labs  ?  ?High Sensitivity Troponin:  No results for input(s): TROPONINIHS in the last 720 hours.   ?Chemistry ?Recent Labs  ?Lab 12/27/21 ?1746 12/27/21 ?[id number] 12/30/21 ?0230 12/31/21 ?0129 01/02/22 ?1006  ?NA 140   < > 132* 135 137  ?K 4.4   < > 3.8 3.4* 4.3  ?CL 108   < > 101 105 108  ?CO2 23   < > 23 21* 20*  ?GLUCOSE 106*   < > 126* 96 121*  ?BUN 8   < > 9 8 8   ?CREATININE 0.85   < > 0.74 0.74 0.64  ?CALCIUM 9.1   < > 8.1* 8.0* 9.0  ?PROT 6.2*  --   --   --   --   ?ALBUMIN 3.9  --   --   --   --   ?AST 32  --   --   --   --   ?  ALT 25  --   --   --   --   ?ALKPHOS 62  --   --   --   --   ?BILITOT 0.6  --   --   --   --   ?GFRNONAA >60   < > >60 >60 >60  ?ANIONGAP 9   < > 8 9 9   ? < > = values in this interval not displayed.  ?  ?Lipids No results for input(s): CHOL, TRIG, HDL, LABVLDL, LDLCALC, CHOLHDL in the last 168 hours.  ?Hematology ?Recent Labs  ?Lab 12/30/21 ?0230 12/31/21 ?0129 01/02/22 ?1006  ?WBC 9.8 7.4 7.6  ?RBC 3.04* 3.13* 3.86*  ?HGB 10.6* 10.7* 13.5  ?HCT 29.1* 30.3* 37.0  ?MCV 95.7 96.8 95.9  ?MCH 34.9* 34.2* 35.0*  ?MCHC 36.4* 35.3 36.5*  ?RDW 12.3 12.5 12.8  ?PLT 130* 157 262  ? ?Thyroid No results for input(s): TSH, FREET4 in the last 168 hours.  ?BNPNo results for input(s): BNP, PROBNP in the last 168 hours.  ?DDimer No results for input(s): DDIMER in the last 168 hours.  ? ?Radiology  ?  ?DG CHEST PORT 1 VIEW ? ?Result Date: 01/02/2022 ?CLINICAL DATA:  Hypotension EXAM: PORTABLE CHEST 1 VIEW COMPARISON:  Chest x-ray 12/28/2021 FINDINGS: Heart size and mediastinum are stable and within normal limits. Pulmonary vasculature appears normal. Opacities at the left lung base likely represent small pleural effusion with associated atelectasis/infiltrate. No pneumothorax. IMPRESSION: Small left pleural effusion with associated  atelectasis/infiltrate. Electronically Signed   By: Ofilia Neas M.D.   On: 01/02/2022 13:28  ? ?ECHOCARDIOGRAM COMPLETE ? ?Result Date: 01/02/2022 ?   ECHOCARDIOGRAM REPORT   Patient Name:   Terri Drake Date of Exam: 01/02/2022 Medical Rec #:  [id number]         Height:       67.0 in Accession #:    [id number]        Weight:       191.0 lb Date of Birth:  April 26, 1961          BSA:          1.983 m? Patient Age:    61 years          BP:           157/57 mmHg Patient Gender: F                 HR:           78 bpm. Exam Location:  Inpatient Procedure: 2D Echo STAT ECHO Indications:    chest trauma  History:        Patient has prior history of Echocardiogram examinations, most                 recent 02/24/2021. Arrythmias:Atrial Fibrillation.  Sonographer:    Johny Chess RDCS Referring Phys: [id number] Jesusita Oka  Sonographer Comments: Restricted mobility due to left side fractures. IMPRESSIONS  1. Left ventricular ejection fraction, by estimation, is 65 to 70%. The left ventricle has hyperdynamic function. The left ventricle has no regional wall motion abnormalities. Left ventricular diastolic parameters are indeterminate.  2. Right ventricular systolic function is normal. The right ventricular size is normal. Tricuspid regurgitation signal is inadequate for assessing PA pressure.  3. The mitral valve is normal in structure. No evidence of mitral valve regurgitation. No evidence of mitral stenosis.  4. The aortic valve is normal in structure. Aortic valve regurgitation is not visualized. No aortic stenosis  is present.  5. The inferior vena cava is normal in size with greater than 50% respiratory variability, suggesting right atrial pressure of 3 mmHg. FINDINGS  Left Ventricle: Left ventricular ejection fraction, by estimation, is 65 to 70%. The left ventricle has hyperdynamic function. The left ventricle has no regional wall motion abnormalities. The left ventricular internal cavity size was normal in size.  There is no left ventricular hypertrophy. Left ventricular diastolic function could not be evaluated due to atrial fibrillation. Left ventricular diastolic parameters are indeterminate. Right Ventricle: The right ventricular size is normal. No increase in right ventricular wall thickness. Right ventricular systolic function is normal. Tricuspid regurgitation signal is inadequate for assessing PA pressure. Left Atrium: Left atrial size was normal in size. Right Atrium: Right atrial size was normal in size. Pericardium: There is no evidence of pericardial effusion. Mitral Valve: The mitral valve is normal in structure. No evidence of mitral valve regurgitation. No evidence of mitral valve stenosis. Tricuspid Valve: The tricuspid valve is normal in structure. Tricuspid valve regurgitation is trivial. No evidence of tricuspid stenosis. Aortic Valve: The aortic valve is normal in structure. Aortic valve regurgitation is not visualized. No aortic stenosis is present. Pulmonic Valve: The pulmonic valve was normal in structure. Pulmonic valve regurgitation is not visualized. No evidence of pulmonic stenosis. Aorta: The aortic root is normal in size and structure. Venous: The inferior vena cava is normal in size with greater than 50% respiratory variability, suggesting right atrial pressure of 3 mmHg. IAS/Shunts: No atrial level shunt detected by color flow Doppler.  LEFT VENTRICLE PLAX 2D LVIDd:         4.20 cm   Diastology LVIDs:         2.40 cm   LV e' medial:   8.55 cm/s LV PW:         1.00 cm   LV E/e' medial: 7.3 LV IVS:        0.90 cm LVOT diam:     2.00 cm LVOT Area:     3.14 cm?  RIGHT VENTRICLE RV S prime:     16.20 cm/s LEFT ATRIUM           Index LA diam:      2.30 cm 1.16 cm/m? LA Vol (A4C): 34.7 ml 17.50 ml/m?   AORTA Ao Root diam: 3.30 cm Ao Asc diam:  2.70 cm MITRAL VALVE MV Area (PHT): 2.77 cm?    SHUNTS MV Decel Time: 274 msec    Systemic Diam: 2.00 cm MV E velocity: 62.40 cm/s MV A velocity: 67.60 cm/s MV  E/A ratio:  0.92 Kardie Tobb DO Electronically signed by Berniece Salines DO Signature Date/Time: 01/02/2022/3:02:03 PM    Final    ? ?Cardiac Studies  ? ?TTE 01/02/22: ?IMPRESSIONS  ? ? 1. Left ventricular ejection fra

## 2022-01-02 NOTE — PMR Pre-admission (Signed)
PMR Admission Coordinator Pre-Admission Assessment ? ?Patient: Terri Drake is an 61 y.o., female ?MRN: [id number] ?DOB: 05-Oct-1960 ?Height: 5\' 7"  (170.2 cm) ?Weight: 86.6 kg ? ?Insurance Information ?HMO:     PPO:      PCP:      IPA:      80/20:      OTHER:  ?PRIMARY: Worker's Comp      Policy#: [id number]      Subscriber: Pt ?CM Name:  Bernadette Hoit with [facility]     Phone#: [phone]     Fax#: [phone] ?Pre-Cert#: [id number]  (case #)          Employer:  ?Benefits:  Phone #:      Name:  ?Eff. Date:      Deduct:       Out of Pocket Max:       Life Max:  ?CIR:       SNF:  ?Outpatient:      Co-Pay:  ?Home Health:       Co-Pay:  ?DME:      Co-Pay:  ?Providers:  ?SECONDARY:       Policy#:      Phone#:  ? ?Financial Counselor:       Phone#:  ? ?The ?Data Collection Information Summary? for patients in Inpatient Rehabilitation Facilities with attached ?Privacy Act [place] Records? was provided and verbally reviewed with: Patient ? ?Emergency Contact Information ?Contact Information   ? ? Name Relation Home Work Mobile  ? Johnston,Timothy Spouse [phone]  [phone]  ? ?  ? ? ?Current Medical History  ?Patient Admitting Diagnosis: Polytrauma  ?History of Present Illness: Terri Drake is a 61 year old right-handed female with history of hypertension, occasional alcohol use, atrial fibrillation maintained on Tambocor as well as Toprol.  Per chart review patient lives with spouse.  1 level home 3 steps to entry.  Independent prior to admission.  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  Admission chemistries unremarkable, alcohol negative.  Cranial CT scan as well as CT cervical spine negative.  CT thoracic spine with possible acute mild superior endplate fractures of 624THL and T12.  CT of chest abdomen pelvis showed no evidence of acute solid organ injury.   There was an acute displaced left inferior scapular fracture as well as acute nondisplaced left second rib fracture as well as sixth rib fracture with small amount of soft tissue gas between the first and second ribs on the left side.  Mild left apical pleural thickening suspect secondary small hematoma from adjacent rib trauma.  Trace left apical pneumothorax.  Left shoulder film follow-up identifies comminuted left scapular body fracture with no additional fracture left shoulder.  CT of the right ankle identified displaced and comminuted trimalleolar fracture as well as slight decreased density of the lateral talar dome suspicious for impaction injury.  Underwent open reduction internal fixation of right trimalleolar ankle fracture without fixation of posterior lip/ORIF of the syndemosis 12/29/2021 per Dr. Marcelino Scot.  Nonweightbearing right lower extremity x8 weeks with splint x2 weeks then convert to CAM for range of motion exercises..  In regards to patient's left scapular body fracture weightbearing as tolerated no surgical intervention and sling for comfort.  In regards to patient's T11/12 endplate fracture discussed with Dr. Kathyrn Sheriff lumbar corset as directed.  Currently maintained on Lovenox for DVT prophylaxis converting to Xarelto 10 mg daily  at discharge x30 days.  Hospital course cardiology service did follow-up in regards to patient's history of atrial fibrillation with intermittent RVR and currently remains on flecainide as well as Lopressor 37.5 mg twice daily.  Echocardiogram was completed that showed ejection fraction of 65 to 70% no regional wall motion abnormalities.  Bouts of urinary retention Foley tube had been placed maintained on Urecholine.  Therapy evaluations completed due to patient decreased functional mobility was admitted for a comprehensive rehab program. ?  ? ?Patient[facility] medical [facility] from [facility] has been reviewed by the rehabilitation admission coordinator and  physician. ? ?Past Medical History  ?Past Medical History:  ?Diagnosis Date  ? Ankle dislocation, right, initial encounter 12/30/2021  ? Ankle syndesmosis disruption, right, initial encounter 12/30/2021  ? Closed displaced trimalleolar fracture of right ankle 12/30/2021  ? Dysrhythmia   ? a-fib  ? Paroxysmal atrial fibrillation (HCC)   ? ? ?Has the patient had major surgery during 100 days prior to admission? Yes ? ?Family History   ?family history is not on file. ? ?Current Medications ? ?Current Facility-Administered Medications:  ?  0.9 %  sodium chloride infusion, , Intravenous, Continuous, Meuth, Brooke A, PA-C, Last Rate: 10 mL/hr at 01/03/22 1052, Rate Change at 01/03/22 1052 ?  acetaminophen (TYLENOL) tablet 1,000 mg, 1,000 mg, Oral, Q6H, Ainsley Spinner, PA-C, 1,000 mg at 01/03/22 1059 ?  ascorbic acid (VITAMIN C) tablet 500 mg, 500 mg, Oral, Daily, Ainsley Spinner, PA-C, 500 mg at 01/03/22 [id number] ?  bacitracin ointment, , Topical, BID, Ainsley Spinner, PA-C, 0000000 application. at 01/03/22 0843 ?  bethanechol (URECHOLINE) tablet 25 mg, 25 mg, Oral, TID, Jillyn Ledger, PA-C, 25 mg at 01/03/22 0845 ?  Chlorhexidine Gluconate Cloth 2 % PADS 6 each, 6 each, Topical, Daily, Ainsley Spinner, PA-C, 6 each at 01/03/22 [id number] ?  docusate sodium (COLACE) capsule 100 mg, 100 mg, Oral, BID, Ainsley Spinner, PA-C, 100 mg at 01/03/22 [id number] ?  enoxaparin (LOVENOX) injection 30 mg, 30 mg, Subcutaneous, Q12H, Ainsley Spinner, PA-C, 30 mg at 01/03/22 0844 ?  flecainide (TAMBOCOR) tablet 100 mg, 100 mg, Oral, Q12H, Isaiah Serge, NP, 100 mg at 01/03/22 0845 ?  ketorolac (TORADOL) 15 MG/ML injection 15 mg, 15 mg, Intravenous, Q6H PRN, Johnathan Hausen, MD, 15 mg at 01/01/22 0920 ?  latanoprost (XALATAN) 0.005 % ophthalmic solution 1 drop, 1 drop, Both Eyes, QHS, Ainsley Spinner, PA-C, 1 drop at 01/02/22 2157 ?  lidocaine (LIDODERM) 5 % 1 patch, 1 patch, Transdermal, Q24H, Ainsley Spinner, PA-C, 1 patch at 01/01/22 2109 ?  methocarbamol (ROBAXIN) tablet 1,000 mg,  1,000 mg, Oral, QID, Ainsley Spinner, PA-C, 1,000 mg at 01/02/22 [id number] ?  metoprolol tartrate (LOPRESSOR) injection 5 mg, 5 mg, Intravenous, Q4H PRN, Jesusita Oka, MD, 5 mg at 01/03/22 1340 ?  metoprolol tartrate (LOPRESSOR) tablet 37.5 mg, 37.5 mg, Oral, BID, Pemberton, Heather E, MD ?  morphine (PF) 2 MG/ML injection 2 mg, 2 mg, Intravenous, Q2H PRN, Maczis, Barth Kirks, PA-C ?  oxyCODONE (Oxy IR/ROXICODONE) immediate release tablet 5-10 mg, 5-10 mg, Oral, Q4H PRN, Jillyn Ledger, PA-C, 5 mg at 01/02/22 0910 ?  polyethylene glycol (MIRALAX / GLYCOLAX) packet 17 g, 17 g, Oral, Daily, Meuth, Brooke A, PA-C ?  zinc sulfate capsule 220 mg, 220 mg, Oral, Daily, Ainsley Spinner, PA-C, 220 mg at 01/03/22 [id number] ? ?Patients Current Diet:  ?Diet Order   ? ?       ?  Diet gluten free Room service  appropriate? Yes; Fluid consistency: Thin  Diet effective now       ?  ? ?  ?  ? ?  ? ? ?Precautions / Restrictions ?Precautions ?Precautions: Fall, Back ?Precaution Comments: orthostatic hypotension ?Other Brace: LSO for OOB, sling for comfort (allowed to move LUE) ?Restrictions ?Weight Bearing Restrictions: Yes ?LUE Weight Bearing: Weight bearing as tolerated ?RLE Weight Bearing: Non weight bearing  ? ?Has the patient had 2 or more falls or a fall with injury in the past year? Yes  ? ?Prior Activity Level ? Pt. Was active in the community PTA  ? ?Prior Functional Level ?Self Care: Did the patient need help bathing, dressing, using the toilet or eating? Independent ? ?Indoor Mobility: Did the patient need assistance with walking from room to room (with or without device)? Independent ? ?Stairs: Did the patient need assistance with internal or external stairs (with or without device)? Independent ? ?Functional Cognition: Did the patient need help planning regular tasks such as shopping or remembering to take medications? Independent ? ?Patient Information ?Are you of Hispanic, Latino/a,or Spanish origin?: A. No, not of Hispanic,  Latino/a, or Spanish origin ?What is your race?: A. White ?Do you need or want an interpreter to communicate with a doctor or health care staff?: 0. No ? ?Patient's Response To:  ?Health Literacy and Transportation

## 2022-01-02 NOTE — Progress Notes (Signed)
?  Echocardiogram ?2D Echocardiogram has been performed. ? ?Delcie Roch ?01/02/2022, 2:39 PM ?
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
## 2022-01-02 NOTE — Progress Notes (Signed)
Inpatient Rehab Admissions Coordinator:  ? ?I do not have a bed for this Pt. Today. I will reach out to worker's comp today to get approval for CIR. I will continue to follow for potential admit pending insurance auth and bed availability. ? ?Megan Salon, MS, CCC-SLP ?Rehab Admissions Coordinator  ?[phone] (celll) ?[phone] (office) ? ?
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
## 2022-01-03 DIAGNOSIS — I480 Paroxysmal atrial fibrillation: Secondary | ICD-10-CM | POA: Diagnosis not present

## 2022-01-03 MED ORDER — MAGNESIUM HYDROXIDE 400 MG/5ML PO SUSP
15.0000 mL | Freq: Once | ORAL | Status: AC
  Administered 2022-01-03: 15 mL via ORAL
  Filled 2022-01-03: qty 30

## 2022-01-03 MED ORDER — METOPROLOL TARTRATE 25 MG PO TABS
37.5000 mg | ORAL_TABLET | Freq: Two times a day (BID) | ORAL | Status: DC
  Administered 2022-01-03 – 2022-01-04 (×2): 37.5 mg via ORAL
  Filled 2022-01-03 (×2): qty 1

## 2022-01-03 MED ORDER — POLYETHYLENE GLYCOL 3350 17 G PO PACK
17.0000 g | PACK | Freq: Every day | ORAL | Status: DC
  Filled 2022-01-03: qty 1

## 2022-01-03 NOTE — Progress Notes (Signed)
Inpatient Rehab Admissions Coordinator:   I do not have a bed for this Pt. Today on CIR. I will continue to follow for potential admit pending bed availability.  Jaykub Mackins, MS, CCC-SLP Rehab Admissions Coordinator  [phone] (celll) [phone] (office)  

## 2022-01-03 NOTE — Progress Notes (Signed)
? ?Progress Note ? ?Patient Name: Terri Drake ?Date of Encounter: 01/03/2022 ? ?[facility] Cardiologist: None  ? ?Subjective  ? ?Continues to have intermittent episodes of Afib on tele. HR better overall mainly low 100s in Afib. ? ?Being transferred to CIR today ? ?Inpatient Medications  ?  ?Scheduled Meds: ? acetaminophen  1,000 mg Oral Q6H  ? vitamin C  500 mg Oral Daily  ? bacitracin   Topical BID  ? bethanechol  25 mg Oral TID  ? Chlorhexidine Gluconate Cloth  6 each Topical Daily  ? docusate sodium  100 mg Oral BID  ? enoxaparin (LOVENOX) injection  30 mg Subcutaneous Q12H  ? flecainide  100 mg Oral Q12H  ? latanoprost  1 drop Both Eyes QHS  ? lidocaine  1 patch Transdermal Q24H  ? methocarbamol  1,000 mg Oral QID  ? metoprolol tartrate  37.5 mg Oral BID  ? polyethylene glycol  17 g Oral Daily  ? zinc sulfate  220 mg Oral Daily  ? ?Continuous Infusions: ? sodium chloride 10 mL/hr at 01/03/22 1052  ? ?PRN Meds: ?ketorolac, metoprolol tartrate, morphine injection, oxyCODONE  ? ?Vital Signs  ?  ?Vitals:  ? 01/03/22 0453 01/03/22 0747 01/03/22 1712 01/03/22 1941  ?BP: (!) 159/94 (!) 131/99 (!) 113/53 (!) 109/56  ?Pulse: 99 (!) 110 63 63  ?Resp: 16 16 16 17   ?Temp: 97.8 ?F (36.6 ?C) 97.8 ?F (36.6 ?C) 97.6 ?F (36.4 ?C) 98.3 ?F (36.8 ?C)  ?TempSrc: Oral Oral Oral Oral  ?SpO2: 96% 97% 98% 96%  ?Weight:      ?Height:      ? ? ?Intake/Output Summary (Last 24 hours) at 01/03/2022 2044 ?Last data filed at 01/03/2022 1335 ?Gross per 24 hour  ?Intake 2076.58 ml  ?Output 4400 ml  ?Net -2323.42 ml  ? ? ? ?  12/27/2021  ?  5:48 PM 11/04/2021  ?  8:31 AM 08/05/2021  ?  8:28 AM  ?Last 3 Weights  ?Weight (lbs) 191 lb 194 lb 9.6 oz 190 lb  ?Weight (kg) 86.637 kg 88.27 kg 86.183 kg  ?   ? ?Telemetry  ?  ?Afib with RVR and NSR - Personally Reviewed ? ?ECG  ?  ?No new tracing - Personally Reviewed ? ?Physical Exam  ? ?GEN: No acute distress.   ?Neck: No JVD ?Cardiac: Irregular, soft systolic murmur  ?Respiratory: Diminished at  the bases; otherwise clear ?GI: Soft, nontender, non-distended  ?MS: RLE splint in place ?Neuro:  Nonfocal  ?Psych: Normal affect  ? ?Labs  ?  ?High Sensitivity Troponin:  No results for input(s): TROPONINIHS in the last 720 hours.   ?Chemistry ?Recent Labs  ?Lab 12/30/21 ?0230 12/31/21 ?0129 01/02/22 ?1006  ?NA 132* 135 137  ?K 3.8 3.4* 4.3  ?CL 101 105 108  ?CO2 23 21* 20*  ?GLUCOSE 126* 96 121*  ?BUN 9 8 8   ?CREATININE 0.74 0.74 0.64  ?CALCIUM 8.1* 8.0* 9.0  ?GFRNONAA >60 >60 >60  ?ANIONGAP 8 9 9   ? ?  ?Lipids No results for input(s): CHOL, TRIG, HDL, LABVLDL, LDLCALC, CHOLHDL in the last 168 hours.  ?Hematology ?Recent Labs  ?Lab 12/30/21 ?0230 12/31/21 ?0129 01/02/22 ?1006  ?WBC 9.8 7.4 7.6  ?RBC 3.04* 3.13* 3.86*  ?HGB 10.6* 10.7* 13.5  ?HCT 29.1* 30.3* 37.0  ?MCV 95.7 96.8 95.9  ?MCH 34.9* 34.2* 35.0*  ?MCHC 36.4* 35.3 36.5*  ?RDW 12.3 12.5 12.8  ?PLT 130* 157 262  ? ? ?Thyroid No results for  input(s): TSH, FREET4 in the last 168 hours.  ?BNPNo results for input(s): BNP, PROBNP in the last 168 hours.  ?DDimer No results for input(s): DDIMER in the last 168 hours.  ? ?Radiology  ?  ?DG CHEST PORT 1 VIEW ? ?Result Date: 01/02/2022 ?CLINICAL DATA:  Hypotension EXAM: PORTABLE CHEST 1 VIEW COMPARISON:  Chest x-ray 12/28/2021 FINDINGS: Heart size and mediastinum are stable and within normal limits. Pulmonary vasculature appears normal. Opacities at the left lung base likely represent small pleural effusion with associated atelectasis/infiltrate. No pneumothorax. IMPRESSION: Small left pleural effusion with associated atelectasis/infiltrate. Electronically Signed   By: Ofilia Neas M.D.   On: 01/02/2022 13:28  ? ?ECHOCARDIOGRAM COMPLETE ? ?Result Date: 01/02/2022 ?   ECHOCARDIOGRAM REPORT   Patient Name:   [name] Date of Exam: 01/02/2022 Medical Rec #:  [id number]         Height:       67.0 in Accession #:    [id number]        Weight:       191.0 lb Date of Birth:  Dec 11, 1960          BSA:          1.983  m? Patient Age:    61 years          BP:           157/57 mmHg Patient Gender: F                 HR:           78 bpm. Exam Location:  Inpatient Procedure: 2D Echo STAT ECHO Indications:    chest trauma  History:        Patient has prior history of Echocardiogram examinations, most                 recent 02/24/2021. Arrythmias:Atrial Fibrillation.  Sonographer:    Johny Chess RDCS Referring Phys: [id number] Jesusita Oka  Sonographer Comments: Restricted mobility due to left side fractures. IMPRESSIONS  1. Left ventricular ejection fraction, by estimation, is 65 to 70%. The left ventricle has hyperdynamic function. The left ventricle has no regional wall motion abnormalities. Left ventricular diastolic parameters are indeterminate.  2. Right ventricular systolic function is normal. The right ventricular size is normal. Tricuspid regurgitation signal is inadequate for assessing PA pressure.  3. The mitral valve is normal in structure. No evidence of mitral valve regurgitation. No evidence of mitral stenosis.  4. The aortic valve is normal in structure. Aortic valve regurgitation is not visualized. No aortic stenosis is present.  5. The inferior vena cava is normal in size with greater than 50% respiratory variability, suggesting right atrial pressure of 3 mmHg. FINDINGS  Left Ventricle: Left ventricular ejection fraction, by estimation, is 65 to 70%. The left ventricle has hyperdynamic function. The left ventricle has no regional wall motion abnormalities. The left ventricular internal cavity size was normal in size. There is no left ventricular hypertrophy. Left ventricular diastolic function could not be evaluated due to atrial fibrillation. Left ventricular diastolic parameters are indeterminate. Right Ventricle: The right ventricular size is normal. No increase in right ventricular wall thickness. Right ventricular systolic function is normal. Tricuspid regurgitation signal is inadequate for assessing PA  pressure. Left Atrium: Left atrial size was normal in size. Right Atrium: Right atrial size was normal in size. Pericardium: There is no evidence of pericardial effusion. Mitral Valve: The mitral valve is normal in structure. No evidence of  mitral valve regurgitation. No evidence of mitral valve stenosis. Tricuspid Valve: The tricuspid valve is normal in structure. Tricuspid valve regurgitation is trivial. No evidence of tricuspid stenosis. Aortic Valve: The aortic valve is normal in structure. Aortic valve regurgitation is not visualized. No aortic stenosis is present. Pulmonic Valve: The pulmonic valve was normal in structure. Pulmonic valve regurgitation is not visualized. No evidence of pulmonic stenosis. Aorta: The aortic root is normal in size and structure. Venous: The inferior vena cava is normal in size with greater than 50% respiratory variability, suggesting right atrial pressure of 3 mmHg. IAS/Shunts: No atrial level shunt detected by color flow Doppler.  LEFT VENTRICLE PLAX 2D LVIDd:         4.20 cm   Diastology LVIDs:         2.40 cm   LV e' medial:   8.55 cm/s LV PW:         1.00 cm   LV E/e' medial: 7.3 LV IVS:        0.90 cm LVOT diam:     2.00 cm LVOT Area:     3.14 cm?  RIGHT VENTRICLE RV S prime:     16.20 cm/s LEFT ATRIUM           Index LA diam:      2.30 cm 1.16 cm/m? LA Vol (A4C): 34.7 ml 17.50 ml/m?   AORTA Ao Root diam: 3.30 cm Ao Asc diam:  2.70 cm MITRAL VALVE MV Area (PHT): 2.77 cm?    SHUNTS MV Decel Time: 274 msec    Systemic Diam: 2.00 cm MV E velocity: 62.40 cm/s MV A velocity: 67.60 cm/s MV E/A ratio:  0.92 Kardie Tobb DO Electronically signed by Berniece Salines DO Signature Date/Time: 01/02/2022/3:02:03 PM    Final    ? ?Cardiac Studies  ? ?TTE 01/02/22: ?IMPRESSIONS  ? ? 1. Left ventricular ejection fraction, by estimation, is 65 to 70%. The  ?left ventricle has hyperdynamic function. The left ventricle has no  ?regional wall motion abnormalities. Left ventricular diastolic parameters   ?are indeterminate.  ? 2. Right ventricular systolic function is normal. The right ventricular  ?size is normal. Tricuspid regurgitation signal is inadequate for assessing  ?PA pressure.  ? 3. The mitral val

## 2022-01-03 NOTE — Progress Notes (Signed)
? ?                              Orthopaedic Trauma Service Progress Note ? ?Patient ID: ?Terri Drake ?MRN: [id number] ?DOB/AGE: 1961-06-13 61 y.o. ? ?Subjective: ? ?Ortho issues stable ?Minimal pain with right ankle ?She states she is going to not use narcotics today ?L arm motion improving  ? ?ROS ?As above ? ?Objective:  ? ?VITALS:   ?Vitals:  ? 01/02/22 1607 01/02/22 2011 01/03/22 0453 01/03/22 0747  ?BP: (!) 153/77 126/66 (!) 159/94 (!) 131/99  ?Pulse: 75 70 99 (!) 110  ?Resp: 18 16 16 16   ?Temp: 97.8 ?F (36.6 ?C) 98 ?F (36.7 ?C) 97.8 ?F (36.6 ?C) 97.8 ?F (36.6 ?C)  ?TempSrc: Oral Oral Oral Oral  ?SpO2: 98% 98% 96% 97%  ?Weight:      ?Height:      ? ? ?Estimated body mass index is 29.91 kg/m? as calculated from the following: ?  Height as of this encounter: 5\' 7"  (1.702 m). ?  Weight as of this encounter: 86.6 kg. ? ? ?Intake/Output   ?   05/15 0701 ?05/16 0700 05/16 0701 ?05/17 0700  ? I.V. (mL/kg) 1576.6 (18.2)   ? Total Intake(mL/kg) 1576.6 (18.2)   ? Urine (mL/kg/hr) 3350 (1.6) 2700 (7.2)  ? Stool  0  ? Total Output 3350 2700  ? Net -1773.4 -2700  ?     ? Stool Occurrence  1 x  ?  ? ?LABS ? ?No results found for this or any previous visit (from the past 24 hour(s)). ? ? ?PHYSICAL EXAM:  ? ?Gen: awake, sitting up in bed, NAD, appears well  ?Lungs: unlabored ?Ext:  ?     Right Lower Extremity  ?            Splint and dressing clean, dry and intact ?            Ext warm  ?            + DP pulse ?            Minimal swelling  ?            EHL and FHL motor intact, lesser toe motor intact ?            DPN, SPN, TN sensation intact ?            No pain out of proportion with passive stretching ?  ?     Left upper extremity ?            Mild swelling L shoulder ?            Radial, ulnar, median nerve motor and sensory function intact.  Axillary nerve motor and sensory function intact ?            + radial pulse ?            Excellent digit, wrist, forearm, elbow ROM  ?             excellent PROM of Left shoulder. Much improved from last week  ? ?Assessment/Plan: ?5 Days Post-Op  ? ?Principal Problem: ?  Closed displaced trimalleolar fracture of right ankle ?Active Problems: ?  Scapula fracture ?  Ankle dislocation, right, initial encounter ?  Ankle syndesmosis disruption, right, initial encounter ? ? ?Anti-infectives (From admission, onward)  ? ? Start     Dose/Rate Route  Frequency Ordered Stop  ? 12/29/21 1600  ceFAZolin (ANCEF) IVPB 2g/100 mL premix       ? 2 g ?200 mL/hr over 30 Minutes Intravenous Every 8 hours 12/29/21 1300 12/30/21 0848  ? 12/29/21 1000  ceFAZolin (ANCEF) IVPB 2g/100 mL premix       ? 2 g ?200 mL/hr over 30 Minutes Intravenous To ShortStay Surgical 12/28/21 1611 12/29/21 0915  ? 12/27/21 1800  ceFAZolin (ANCEF) IVPB 2g/100 mL premix       ? 2 g ?200 mL/hr over 30 Minutes Intravenous  Once 12/27/21 1746 12/27/21 1836  ? ?  ?. ? ?POD/HD#: 5 ? ?61 y/o female work related accident with closed right trimalleolar ankle fracture dislocation and closed left scapular body fracture  ?  ?-work related injury  ?  ?- closed right trimalleolar ankle fracture dislocation and syndesmotic disruption s/p ORIF  ?            NWB R leg x 8 weeks ?            Splint x 2 weeks then convert to CAM for ROM exercises  (convert to CAM in another 9 days) ?            Therapy evals  ?            Ice and elevate leg above heart for swelling and pain control  ?            Therapy evals  ?  ?- closed L scapular body fracture  ?            Non-op ?            ROM as tolerated ?            Sling for comfort  ?  Recommend minimizing sling use ?  Ok to go without sling at all ?            Ice  ?  ?- Pain management: ?            Multimodal ?  ?- ABL anemia/Hemodynamics ?            Stable ?  ?- Medical issues  ?            Per trauma  ?  ?- DVT/PE prophylaxis: ?            Recommend xarelto 10 mg daily x 30 days at dc ?  ?- ID:  ?            Periop abx ?  ?- Metabolic Bone Disease: ?             + vitamin d deficiency  ?  Supplement  ?  ?- Activity: ?            As above ?  ?- Dispo: ?            ortho issues stable  ? CIR pending bed opening  ? ? ?Jari Pigg, PA-C ?[phone] (C) ?01/03/2022, 11:22 AM ? ?[facility] ?[place] [address] ?[phone] Jenetta Downer) ?[phone] (F) ? ? ? ?After 5pm and on the weekends please log on to Amion, go to orthopaedics and the look under the [facility] Call for the provider(s) on call. You can also call our office at [phone] and then follow the prompts to be connected to the call team.  ? Patient ID: Terri Drake, female   DOB: [birth year],  61 y.o.   MRN: [id number] ? ?

## 2022-01-03 NOTE — Progress Notes (Signed)
Patient ID: Terri Drake, female   DOB: October 27, 1960, 61 y.o.   MRN: [id number] ?[facility] ?Progress Note ? ?5 Days Post-Op  ?Subjective: ?CC-  ?Pain well controlled. Tolerating diet without n/v. No BM since Friday.  ?Hopeful for voiding trial today. ?Reports a random nonproductive cough that started yesterday. Denies CP or SOB. Pulling 1000 on IS. ? ?Objective: ?Vital signs in last 24 hours: ?Temp:  [97.8 ?F (36.6 ?C)-98 ?F (36.7 ?C)] 97.8 ?F (36.6 ?C) (05/16 0747) ?Pulse Rate:  [70-110] 110 (05/16 0747) ?Resp:  [16-18] 16 (05/16 0747) ?BP: (126-159)/(66-99) 131/99 (05/16 0747) ?SpO2:  [96 %-98 %] 97 % (05/16 0747) ?Last BM Date : 12/30/21 ? ?Intake/Output from previous day: ?05/15 0701 - 05/16 0700 ?In: 1576.6 [I.V.:1576.6] ?Out: 3350 [Urine:3350] ?Intake/Output this shift: ?No intake/output data recorded. ? ?PE: ?Gen:  Alert, NAD, pleasant ?Card:  irregular ?Pulm:  CTAB, no W/R/R, effort normal on room air ?Abd: Soft, ND, NT, +BS ?GU: Foley in place w/ clear yellow urine ?Ext: Splint to RLE. WWP. Able to wiggle toes. No LLE edema. Moves BUE's ?Psych: A&Ox3  ?Skin: no rashes noted, warm and dry ? ?Lab Results:  ?Recent Labs  ?  01/02/22 ?1006  ?WBC 7.6  ?HGB 13.5  ?HCT 37.0  ?PLT 262  ? ?BMET ?Recent Labs  ?  01/02/22 ?1006  ?NA 137  ?K 4.3  ?CL 108  ?CO2 20*  ?GLUCOSE 121*  ?BUN 8  ?CREATININE 0.64  ?CALCIUM 9.0  ? ?PT/INR ?No results for input(s): LABPROT, INR in the last 72 hours. ?CMP  ?   ?Component Value Date/Time  ? NA 137 01/02/2022 1006  ? K 4.3 01/02/2022 1006  ? CL 108 01/02/2022 1006  ? CO2 20 (L) 01/02/2022 1006  ? GLUCOSE 121 (H) 01/02/2022 1006  ? BUN 8 01/02/2022 1006  ? CREATININE 0.64 01/02/2022 1006  ? CALCIUM 9.0 01/02/2022 1006  ? PROT 6.2 (L) 12/27/2021 1746  ? ALBUMIN 3.9 12/27/2021 1746  ? AST 32 12/27/2021 1746  ? ALT 25 12/27/2021 1746  ? ALKPHOS 62 12/27/2021 1746  ? BILITOT 0.6 12/27/2021 1746  ? GFRNONAA >60 01/02/2022 1006  ? ?Lipase  ?No results found for:  LIPASE ? ? ? ? ?Studies/Results: ?DG CHEST PORT 1 VIEW ? ?Result Date: 01/02/2022 ?CLINICAL DATA:  Hypotension EXAM: PORTABLE CHEST 1 VIEW COMPARISON:  Chest x-ray 12/28/2021 FINDINGS: Heart size and mediastinum are stable and within normal limits. Pulmonary vasculature appears normal. Opacities at the left lung base likely represent small pleural effusion with associated atelectasis/infiltrate. No pneumothorax. IMPRESSION: Small left pleural effusion with associated atelectasis/infiltrate. Electronically Signed   By: Ofilia Neas M.D.   On: 01/02/2022 13:28  ? ?ECHOCARDIOGRAM COMPLETE ? ?Result Date: 01/02/2022 ?   ECHOCARDIOGRAM REPORT   Patient Name:   Terri Drake Date of Exam: 01/02/2022 Medical Rec #:  [id number]         Height:       67.0 in Accession #:    [id number]        Weight:       191.0 lb Date of Birth:  Jun 17, 1961          BSA:          1.983 m? Patient Age:    42 years          BP:           157/57 mmHg Patient Gender: F  HR:           78 bpm. Exam Location:  Inpatient Procedure: 2D Echo STAT ECHO Indications:    chest trauma  History:        Patient has prior history of Echocardiogram examinations, most                 recent 02/24/2021. Arrythmias:Atrial Fibrillation.  Sonographer:    Delcie Roch RDCS Referring Phys: [id number] Diamantina Monks  Sonographer Comments: Restricted mobility due to left side fractures. IMPRESSIONS  1. Left ventricular ejection fraction, by estimation, is 65 to 70%. The left ventricle has hyperdynamic function. The left ventricle has no regional wall motion abnormalities. Left ventricular diastolic parameters are indeterminate.  2. Right ventricular systolic function is normal. The right ventricular size is normal. Tricuspid regurgitation signal is inadequate for assessing PA pressure.  3. The mitral valve is normal in structure. No evidence of mitral valve regurgitation. No evidence of mitral stenosis.  4. The aortic valve is normal in structure.  Aortic valve regurgitation is not visualized. No aortic stenosis is present.  5. The inferior vena cava is normal in size with greater than 50% respiratory variability, suggesting right atrial pressure of 3 mmHg. FINDINGS  Left Ventricle: Left ventricular ejection fraction, by estimation, is 65 to 70%. The left ventricle has hyperdynamic function. The left ventricle has no regional wall motion abnormalities. The left ventricular internal cavity size was normal in size. There is no left ventricular hypertrophy. Left ventricular diastolic function could not be evaluated due to atrial fibrillation. Left ventricular diastolic parameters are indeterminate. Right Ventricle: The right ventricular size is normal. No increase in right ventricular wall thickness. Right ventricular systolic function is normal. Tricuspid regurgitation signal is inadequate for assessing PA pressure. Left Atrium: Left atrial size was normal in size. Right Atrium: Right atrial size was normal in size. Pericardium: There is no evidence of pericardial effusion. Mitral Valve: The mitral valve is normal in structure. No evidence of mitral valve regurgitation. No evidence of mitral valve stenosis. Tricuspid Valve: The tricuspid valve is normal in structure. Tricuspid valve regurgitation is trivial. No evidence of tricuspid stenosis. Aortic Valve: The aortic valve is normal in structure. Aortic valve regurgitation is not visualized. No aortic stenosis is present. Pulmonic Valve: The pulmonic valve was normal in structure. Pulmonic valve regurgitation is not visualized. No evidence of pulmonic stenosis. Aorta: The aortic root is normal in size and structure. Venous: The inferior vena cava is normal in size with greater than 50% respiratory variability, suggesting right atrial pressure of 3 mmHg. IAS/Shunts: No atrial level shunt detected by color flow Doppler.  LEFT VENTRICLE PLAX 2D LVIDd:         4.20 cm   Diastology LVIDs:         2.40 cm   LV e'  medial:   8.55 cm/s LV PW:         1.00 cm   LV E/e' medial: 7.3 LV IVS:        0.90 cm LVOT diam:     2.00 cm LVOT Area:     3.14 cm?  RIGHT VENTRICLE RV S prime:     16.20 cm/s LEFT ATRIUM           Index LA diam:      2.30 cm 1.16 cm/m? LA Vol (A4C): 34.7 ml 17.50 ml/m?   AORTA Ao Root diam: 3.30 cm Ao Asc diam:  2.70 cm MITRAL VALVE MV Area (PHT): 2.77 cm?  SHUNTS MV Decel Time: 274 msec    Systemic Diam: 2.00 cm MV E velocity: 62.40 cm/s MV A velocity: 67.60 cm/s MV E/A ratio:  0.92 Kardie Tobb DO Electronically signed by Berniece Salines DO Signature Date/Time: 01/02/2022/3:02:03 PM    Final    ? ?Anti-infectives: ?Anti-infectives (From admission, onward)  ? ? Start     Dose/Rate Route Frequency Ordered Stop  ? 12/29/21 1600  ceFAZolin (ANCEF) IVPB 2g/100 mL premix       ? 2 g ?200 mL/hr over 30 Minutes Intravenous Every 8 hours 12/29/21 1300 12/30/21 0848  ? 12/29/21 1000  ceFAZolin (ANCEF) IVPB 2g/100 mL premix       ? 2 g ?200 mL/hr over 30 Minutes Intravenous To ShortStay Surgical 12/28/21 1611 12/29/21 0915  ? 12/27/21 1800  ceFAZolin (ANCEF) IVPB 2g/100 mL premix       ? 2 g ?200 mL/hr over 30 Minutes Intravenous  Once 12/27/21 1746 12/27/21 1836  ? ?  ? ? ? ?Assessment/Plan ?85F s/p hit by tree branch.  ?T11/12 endplate frx - NSGY c/s, Dr. Kathyrn Sheriff. Lumbar corset for comfort when the patient is up. Therapies.  ?R ankle fx/dislocation- Reduced by EDP. Ortho c/s, Dr. Stann Mainland. Care transferred to Ortho trauma, Dr. Marcelino Scot - s/p ORIF 5/11. Recommended for NWB RLE 8 weeks. Splint x 2 weeks then covert to CAM for ROM exercises. 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 OT following.  ?L rib fx 2,6 - IS, pulm toilet, pain control.  ?L PTX - follow up CXR 5/10 and 5/15 without PTX. Continue IS and pulm toilet ?L scapula fx - ortho c/s, sling, WBAT LUE w/ ROM as  tolerated  ?Abrasions to L shoulder and L knee - local wound care ?A fib with intermittent RVR - cardiology following, cont Flecainide 100mg  BID and Metop 37.5mg  XL daily (increased 5/16, uptitrate PRN) ?ABL anemia - hgb st

## 2022-01-03 NOTE — Progress Notes (Addendum)
Physical Therapy Treatment ?Patient Details ?Name: Terri Drake ?MRN: [id number] ?DOB: 07-03-1961 ?Today's Date: 01/03/2022 ? ? ?History of Present Illness Pt is 61 yr old F admitted on 12/27/21 with obvious ankle deformity after being struck by tree branch. Imaging (+) for R trimalleolar fx; displaced L inferior scapular fx; L 2nd + 6th rib fx; possible T11-12 fx. Underwent R ankle ORIF on 5/11. PMH: a-fib ? ?  ?PT Comments  ? ? Pt received up in chair and motivated to participate. No orthostasis noted this session; BP sitting 119/74 (86), BP standing 144/78 (97). Session focused on LUE AAROM and postural re-education exercises, transfer training, standing exercises, and pre gait training. Pt requiring min assist for transfers. Worked on hopping in place with LLE; pt has difficulty achieving adequate foot clearance to advance at this time. Will benefit from practicing w/c mobility. Remains an excellent candidate for AIR based on age, motivation, and PLOF. ?   ?Recommendations for follow up therapy are one component of a multi-disciplinary discharge planning process, led by the attending physician.  Recommendations may be updated based on patient status, additional functional criteria and insurance authorization. ? ?Follow Up Recommendations ? Acute inpatient rehab (3hours/day) ?  ?  ?Assistance Recommended at Discharge Frequent or constant Supervision/Assistance  ?Patient can return home with the following A lot of help with walking and/or transfers;A lot of help with bathing/dressing/bathroom;Assist for transportation;Assistance with cooking/housework;Help with stairs or ramp for entrance ?  ?Equipment Recommendations ? Rolling walker (2 wheels);Wheelchair (measurements PT);BSC/3in1 (L platform RW)  ?  ?Recommendations for Other Services   ? ? ?  ?Precautions / Restrictions Precautions ?Precautions: Fall;Back ?Precaution Comments: orthostatic hypotension 5/15 ?Required Braces or Orthoses: Sling;Other Brace ?Other  Brace: LSO for OOB, sling for comfort (allowed to move LUE) ?Restrictions ?Weight Bearing Restrictions: Yes ?LUE Weight Bearing: Weight bearing as tolerated ?RLE Weight Bearing: Non weight bearing  ?  ? ?Mobility ? Bed Mobility ?  ?  ?  ?  ?  ?  ?  ?General bed mobility comments: OOB in chair ?  ? ?Transfers ?Overall transfer level: Needs assistance ?Equipment used: Left platform walker ?Transfers: Sit to/from Stand, Bed to chair/wheelchair/BSC ?Sit to Stand: Min assist ?  ?  ?  ?  ?  ?General transfer comment: Light minA to initially steady, increased time/effort ?  ? ?Ambulation/Gait ?  ?  ?  ?  ?  ?  ?  ?General Gait Details: unable to progress ? ? ?Stairs ?  ?  ?  ?  ?  ? ? ?Wheelchair Mobility ?  ? ?Modified Rankin (Stroke Patients Only) ?  ? ? ?  ?Balance Overall balance assessment: Needs assistance ?Sitting-balance support: No upper extremity supported ?Sitting balance-Leahy Scale: Good ?  ?  ?Standing balance support: Bilateral upper extremity supported ?Standing balance-Leahy Scale: Poor ?Standing balance comment: dependent on BUE support due to NWB RLE ?  ?  ?  ?  ?  ?  ?  ?  ?  ?  ?  ?  ? ?  ?Cognition Arousal/Alertness: Awake/alert ?Behavior During Therapy: [facility] for tasks assessed/performed ?Overall Cognitive Status: Within Functional Limits for tasks assessed ?  ?  ?  ?  ?  ?  ?  ?  ?  ?  ?  ?  ?  ?  ?  ?  ?  ?  ?  ? ?  ?Exercises General Exercises - Upper Extremity ?Shoulder Flexion: Left, 10 reps, Seated, Self ROM ?General Exercises - Lower  Extremity ?Quad Sets: 10 reps, Supine, Both ?Heel Raises: Left, 5 reps, Standing ?Mini-Sqauts: Left, 10 reps, Standing (LLE single leg squats) ?Other Exercises ?Other Exercises: Sitting: LUE table slides, scapular retractions x 10, cervical retractions x 5 ?Other Exercises: Sit to stands x 3 ?Other Exercises: Standing: hopping in place x 5 with LLE ? ?  ?General Comments   ?  ?  ? ?Pertinent Vitals/Pain Pain Assessment ?Pain Assessment: Faces ?Faces Pain Scale:  Hurts a little bit ?Pain Location: L scapula ?Pain Descriptors / Indicators: Aching, Grimacing ?Pain Intervention(s): Monitored during session  ? ? ?Home Living   ?  ?  ?  ?  ?  ?  ?  ?  ?  ?   ?  ?Prior Function    ?  ?  ?   ? ?PT Goals (current goals can now be found in the care plan section) Acute Rehab PT Goals ?Patient Stated Goal: Pt's goal is to return to independence and return home. ?PT Goal Formulation: With patient/family ?Time For Goal Achievement: 01/13/22 ?Potential to Achieve Goals: Good ?Progress towards PT goals: Progressing toward goals ? ?  ?Frequency ? ? ? Min 5X/week ? ? ? ?  ?PT Plan Current plan remains appropriate  ? ? ?Co-evaluation   ?  ?  ?  ?  ? ?  ?AM-PAC PT "6 Clicks" Mobility   ?Outcome Measure ? Help needed turning from your back to your side while in a flat bed without using bedrails?: A Little ?Help needed moving from lying on your back to sitting on the side of a flat bed without using bedrails?: A Little ?Help needed moving to and from a bed to a chair (including a wheelchair)?: A Lot ?Help needed standing up from a chair using your arms (e.g., wheelchair or bedside chair)?: A Little ?Help needed to walk in hospital room?: Total ?Help needed climbing 3-5 steps with a railing? : Total ?6 Click Score: 13 ? ?  ?End of Session Equipment Utilized During Treatment: Gait belt ?Activity Tolerance: Patient tolerated treatment well ?Patient left: in chair;with call bell/phone within reach;with chair alarm set ?Nurse Communication: Mobility status;Need for lift equipment ?PT Visit Diagnosis: Pain;Other abnormalities of gait and mobility (R26.89) ?Pain - Right/Left: Left ?Pain - part of body: Shoulder ?  ? ? ?Time: [id number] ?PT Time Calculation (min) (ACUTE ONLY): 40 min ? ?Charges:  $Therapeutic Exercise: 8-22 mins ?$Therapeutic Activity: 23-37 mins          ?          ? ?Wyona Almas, PT, DPT ?Acute Rehabilitation Services ?Pager [phone] ?Office [phone] ? ? ? ?Terri Drake ?01/03/2022, 5:12 PM ? ?

## 2022-01-04 ENCOUNTER — Other Ambulatory Visit: Payer: Self-pay

## 2022-01-04 ENCOUNTER — Inpatient Hospital Stay (HOSPITAL_COMMUNITY)
Admission: RE | Admit: 2022-01-04 | Discharge: 2022-01-18 | DRG: 560 | Disposition: A | Discharge status: Home Health | Payer: No Typology Code available for payment source | Source: Intra-hospital | Attending: Physical Medicine and Rehabilitation | Admitting: Physical Medicine and Rehabilitation

## 2022-01-04 ENCOUNTER — Encounter (HOSPITAL_COMMUNITY): Payer: Self-pay | Admitting: Orthopedic Surgery

## 2022-01-04 DIAGNOSIS — Z79899 Other long term (current) drug therapy: Secondary | ICD-10-CM

## 2022-01-04 DIAGNOSIS — S82851A Displaced trimalleolar fracture of right lower leg, initial encounter for closed fracture: Secondary | ICD-10-CM | POA: Diagnosis not present

## 2022-01-04 DIAGNOSIS — S93431D Sprain of tibiofibular ligament of right ankle, subsequent encounter: Secondary | ICD-10-CM | POA: Diagnosis not present

## 2022-01-04 DIAGNOSIS — M7989 Other specified soft tissue disorders: Secondary | ICD-10-CM | POA: Diagnosis not present

## 2022-01-04 DIAGNOSIS — I959 Hypotension, unspecified: Secondary | ICD-10-CM | POA: Diagnosis not present

## 2022-01-04 DIAGNOSIS — B9620 Unspecified Escherichia coli [E. coli] as the cause of diseases classified elsewhere: Secondary | ICD-10-CM | POA: Diagnosis not present

## 2022-01-04 DIAGNOSIS — R7401 Elevation of levels of liver transaminase levels: Secondary | ICD-10-CM | POA: Diagnosis not present

## 2022-01-04 DIAGNOSIS — R339 Retention of urine, unspecified: Secondary | ICD-10-CM | POA: Diagnosis present

## 2022-01-04 DIAGNOSIS — S2242XA Multiple fractures of ribs, left side, initial encounter for closed fracture: Secondary | ICD-10-CM

## 2022-01-04 DIAGNOSIS — S42112D Displaced fracture of body of scapula, left shoulder, subsequent encounter for fracture with routine healing: Secondary | ICD-10-CM

## 2022-01-04 DIAGNOSIS — N390 Urinary tract infection, site not specified: Secondary | ICD-10-CM | POA: Diagnosis not present

## 2022-01-04 DIAGNOSIS — K5900 Constipation, unspecified: Secondary | ICD-10-CM | POA: Diagnosis present

## 2022-01-04 DIAGNOSIS — I480 Paroxysmal atrial fibrillation: Secondary | ICD-10-CM | POA: Diagnosis present

## 2022-01-04 DIAGNOSIS — Z882 Allergy status to sulfonamides status: Secondary | ICD-10-CM | POA: Diagnosis not present

## 2022-01-04 DIAGNOSIS — W208XXD Other cause of strike by thrown, projected or falling object, subsequent encounter: Secondary | ICD-10-CM | POA: Diagnosis present

## 2022-01-04 DIAGNOSIS — S82853A Displaced trimalleolar fracture of unspecified lower leg, initial encounter for closed fracture: Secondary | ICD-10-CM | POA: Diagnosis present

## 2022-01-04 DIAGNOSIS — S82851S Displaced trimalleolar fracture of right lower leg, sequela: Secondary | ICD-10-CM | POA: Diagnosis not present

## 2022-01-04 DIAGNOSIS — G4700 Insomnia, unspecified: Secondary | ICD-10-CM | POA: Diagnosis not present

## 2022-01-04 DIAGNOSIS — S2242XD Multiple fractures of ribs, left side, subsequent encounter for fracture with routine healing: Secondary | ICD-10-CM

## 2022-01-04 DIAGNOSIS — T148XXA Other injury of unspecified body region, initial encounter: Secondary | ICD-10-CM | POA: Diagnosis not present

## 2022-01-04 DIAGNOSIS — I951 Orthostatic hypotension: Secondary | ICD-10-CM | POA: Diagnosis not present

## 2022-01-04 DIAGNOSIS — Z91018 Allergy to other foods: Secondary | ICD-10-CM | POA: Diagnosis not present

## 2022-01-04 DIAGNOSIS — S42102A Fracture of unspecified part of scapula, left shoulder, initial encounter for closed fracture: Secondary | ICD-10-CM

## 2022-01-04 DIAGNOSIS — E559 Vitamin D deficiency, unspecified: Secondary | ICD-10-CM | POA: Diagnosis present

## 2022-01-04 DIAGNOSIS — G8918 Other acute postprocedural pain: Secondary | ICD-10-CM | POA: Diagnosis not present

## 2022-01-04 DIAGNOSIS — S82851D Displaced trimalleolar fracture of right lower leg, subsequent encounter for closed fracture with routine healing: Secondary | ICD-10-CM | POA: Diagnosis present

## 2022-01-04 DIAGNOSIS — R001 Bradycardia, unspecified: Secondary | ICD-10-CM | POA: Diagnosis not present

## 2022-01-04 DIAGNOSIS — I4891 Unspecified atrial fibrillation: Secondary | ICD-10-CM | POA: Diagnosis not present

## 2022-01-04 LAB — CBC
HCT: 34.9 % — ABNORMAL LOW (ref 36.0–46.0)
Hemoglobin: 12.4 g/dL (ref 12.0–15.0)
MCH: 34.5 pg — ABNORMAL HIGH (ref 26.0–34.0)
MCHC: 35.5 g/dL (ref 30.0–36.0)
MCV: 97.2 fL (ref 80.0–100.0)
Platelets: 300 10*3/uL (ref 150–400)
RBC: 3.59 MIL/uL — ABNORMAL LOW (ref 3.87–5.11)
RDW: 13.1 % (ref 11.5–15.5)
WBC: 7.7 10*3/uL (ref 4.0–10.5)
nRBC: 0 % (ref 0.0–0.2)

## 2022-01-04 LAB — BASIC METABOLIC PANEL
Anion gap: 9 (ref 5–15)
BUN: 11 mg/dL (ref 8–23)
CO2: 23 mmol/L (ref 22–32)
Calcium: 9.1 mg/dL (ref 8.9–10.3)
Chloride: 105 mmol/L (ref 98–111)
Creatinine, Ser: 0.73 mg/dL (ref 0.44–1.00)
GFR, Estimated: >60 mL/min (ref >60)
Glucose, Bld: 106 mg/dL — ABNORMAL HIGH (ref 70–99)
Potassium: 4.2 mmol/L (ref 3.5–5.1)
Sodium: 137 mmol/L (ref 135–145)

## 2022-01-04 MED ORDER — VITAMIN D (ERGOCALCIFEROL) 1.25 MG (50000 UNIT) PO CAPS
50000.0000 [IU] | ORAL_CAPSULE | ORAL | Status: DC
  Administered 2022-01-04 – 2022-01-11 (×2): 50000 [IU] via ORAL
  Filled 2022-01-04 (×2): qty 1

## 2022-01-04 MED ORDER — METOPROLOL TARTRATE 50 MG PO TABS
50.0000 mg | ORAL_TABLET | Freq: Two times a day (BID) | ORAL | Status: DC
  Administered 2022-01-04 – 2022-01-05 (×2): 50 mg via ORAL
  Filled 2022-01-04 (×2): qty 1

## 2022-01-04 MED ORDER — ASCORBIC ACID 500 MG PO TABS
500.0000 mg | ORAL_TABLET | Freq: Every day | ORAL | Status: DC
Start: 2022-01-04 — End: 2022-01-18
  Administered 2022-01-05 – 2022-01-18 (×14): 500 mg via ORAL
  Filled 2022-01-04 (×16): qty 1

## 2022-01-04 MED ORDER — ENOXAPARIN SODIUM 30 MG/0.3ML IJ SOSY
30.0000 mg | PREFILLED_SYRINGE | Freq: Two times a day (BID) | INTRAMUSCULAR | Status: DC
  Administered 2022-01-04 – 2022-01-10 (×12): 30 mg via SUBCUTANEOUS
  Filled 2022-01-04 (×12): qty 0.3

## 2022-01-04 MED ORDER — ACETAMINOPHEN 325 MG PO TABS
325.0000 mg | ORAL_TABLET | ORAL | Status: DC | PRN
  Administered 2022-01-04: 650 mg via ORAL
  Filled 2022-01-04 (×2): qty 2

## 2022-01-04 MED ORDER — FLECAINIDE ACETATE 100 MG PO TABS
100.0000 mg | ORAL_TABLET | Freq: Two times a day (BID) | ORAL | Status: DC
  Administered 2022-01-04 – 2022-01-18 (×27): 100 mg via ORAL
  Filled 2022-01-04 (×28): qty 1

## 2022-01-04 MED ORDER — METHOCARBAMOL 500 MG PO TABS
1000.0000 mg | ORAL_TABLET | Freq: Four times a day (QID) | ORAL | Status: DC
  Administered 2022-01-05: 1000 mg via ORAL
  Filled 2022-01-04 (×3): qty 2

## 2022-01-04 MED ORDER — LIDOCAINE 5 % EX PTCH
1.0000 | MEDICATED_PATCH | CUTANEOUS | Status: DC
  Administered 2022-01-08: 1 via TRANSDERMAL
  Filled 2022-01-04 (×7): qty 1

## 2022-01-04 MED ORDER — BACITRACIN ZINC 500 UNIT/GM EX OINT
TOPICAL_OINTMENT | Freq: Two times a day (BID) | CUTANEOUS | Status: DC
  Filled 2022-01-04: qty 28.4

## 2022-01-04 MED ORDER — RIVAROXABAN 10 MG PO TABS
10.0000 mg | ORAL_TABLET | Freq: Every day | ORAL | Status: DC
  Filled 2022-01-04: qty 1

## 2022-01-04 MED ORDER — OXYCODONE HCL 5 MG PO TABS
5.0000 mg | ORAL_TABLET | ORAL | Status: DC | PRN
  Administered 2022-01-04 – 2022-01-06 (×3): 5 mg via ORAL
  Administered 2022-01-06: 10 mg via ORAL
  Administered 2022-01-07 – 2022-01-09 (×4): 5 mg via ORAL
  Filled 2022-01-04: qty 1
  Filled 2022-01-04: qty 2
  Filled 2022-01-04 (×4): qty 1
  Filled 2022-01-04 (×2): qty 2
  Filled 2022-01-04: qty 1
  Filled 2022-01-04: qty 2
  Filled 2022-01-04 (×2): qty 1

## 2022-01-04 MED ORDER — BETHANECHOL CHLORIDE 25 MG PO TABS
25.0000 mg | ORAL_TABLET | Freq: Three times a day (TID) | ORAL | Status: DC
  Administered 2022-01-04 – 2022-01-05 (×3): 25 mg via ORAL
  Filled 2022-01-04 (×3): qty 1

## 2022-01-04 MED ORDER — METOPROLOL TARTRATE 50 MG PO TABS: 50.0000 mg | ORAL_TABLET | Freq: Two times a day (BID) | ORAL | Status: DC

## 2022-01-04 MED ORDER — DOCUSATE SODIUM 100 MG PO CAPS
100.0000 mg | ORAL_CAPSULE | Freq: Two times a day (BID) | ORAL | Status: DC
  Administered 2022-01-05 – 2022-01-07 (×3): 100 mg via ORAL
  Filled 2022-01-04 (×6): qty 1

## 2022-01-04 MED ORDER — LATANOPROST 0.005 % OP SOLN
1.0000 [drp] | Freq: Every day | OPHTHALMIC | Status: DC
  Administered 2022-01-04 – 2022-01-16 (×13): 1 [drp] via OPHTHALMIC
  Filled 2022-01-04: qty 2.5

## 2022-01-04 MED ORDER — MAGNESIUM GLUCONATE 500 MG PO TABS
250.0000 mg | ORAL_TABLET | Freq: Every day | ORAL | Status: DC
  Administered 2022-01-06 – 2022-01-17 (×12): 250 mg via ORAL
  Filled 2022-01-04 (×13): qty 1

## 2022-01-04 MED ORDER — POLYETHYLENE GLYCOL 3350 17 G PO PACK
17.0000 g | PACK | Freq: Every day | ORAL | Status: DC
Start: 2022-01-04 — End: 2022-01-06
  Filled 2022-01-04 (×3): qty 1

## 2022-01-04 MED ORDER — ZINC SULFATE 220 (50 ZN) MG PO CAPS
220.0000 mg | ORAL_CAPSULE | Freq: Every day | ORAL | Status: DC
Start: 2022-01-04 — End: 2022-01-18
  Administered 2022-01-05 – 2022-01-18 (×14): 220 mg via ORAL
  Filled 2022-01-04 (×16): qty 1

## 2022-01-04 NOTE — Discharge Summary (Signed)
?[facility] ?Discharge Summary  ? ?Patient ID: ?Terri Drake ?MRN: [id number] ?DOB/AGE: 09/23/1960 61 y.o. ? ?Admit date: 12/27/2021 ?Discharge date: 01/04/2022 ? ?Admitting Diagnosis: ?Hit by tree branch ?T11/12 endplate fracture ?Right ankle fracture ?Left rib fracture 2,6  ?Left pneumothorax  ?Left scapula fracture ?Abrasions to Left shoulder and Left knee  ? 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 ?Consultants ?Orthopedics ?Neurosurgery ?Cardiology ? ?Imaging: ?DG CHEST PORT 1 VIEW ? ?Result Date: 01/02/2022 ?CLINICAL DATA:  Hypotension EXAM: PORTABLE CHEST 1 VIEW COMPARISON:  Chest x-ray 12/28/2021 FINDINGS: Heart size and mediastinum are stable and within normal limits. Pulmonary vasculature appears normal. Opacities at the left lung base likely represent small pleural effusion with associated atelectasis/infiltrate. No pneumothorax. IMPRESSION: Small left pleural effusion with associated atelectasis/infiltrate. Electronically Signed   By: Ofilia Neas M.D.   On: 01/02/2022 13:28  ? ?ECHOCARDIOGRAM COMPLETE ? ?Result Date: 01/02/2022 ?   ECHOCARDIOGRAM REPORT   Patient Name:   Terri Drake Date of Exam: 01/02/2022 Medical Rec #:  [id number]         Height:       67.0 in Accession #:    [id number]        Weight:       191.0 lb Date of Birth:  [birth year]          BSA:          1.983 m? Patient Age:    61 years          BP:           157/57 mmHg Patient Gender: F                 HR:           78 bpm. Exam Location:  Inpatient Procedure: 2D Echo STAT ECHO Indications:    chest trauma  History:        Patient has prior history of Echocardiogram examinations, most                 recent  02/24/2021. Arrythmias:Atrial Fibrillation.  Sonographer:    Johny Chess RDCS Referring Phys: [id number] Jesusita Oka  Sonographer Comments: Restricted mobility due to left side fractures. IMPRESSIONS  1. Left ventricular ejection fraction, by estimation, is 65 to 70%. The left ventricle has hyperdynamic function. The left ventricle has no regional wall motion abnormalities. Left ventricular diastolic parameters are indeterminate.  2. Right ventricular systolic function is normal. The right ventricular size is normal. Tricuspid regurgitation signal is inadequate for assessing PA pressure.  3. The mitral valve is normal in structure. No evidence of mitral valve regurgitation. No evidence of mitral stenosis.  4. The aortic valve is normal in structure. Aortic valve regurgitation is not visualized. No aortic stenosis is present.  5. The inferior vena cava is normal in size with greater than 50% respiratory variability, suggesting right atrial pressure of 3 mmHg. FINDINGS  Left Ventricle: Left ventricular ejection fraction, by estimation, is 65 to 70%. The left ventricle has hyperdynamic function. The left ventricle has no regional wall motion abnormalities. The left ventricular internal cavity size was normal in size. There is no left ventricular hypertrophy. Left ventricular diastolic function could not be evaluated due  to atrial fibrillation. Left ventricular diastolic parameters are indeterminate. Right Ventricle: The right ventricular size is normal. No increase in right ventricular wall thickness. Right ventricular systolic function is normal. Tricuspid regurgitation signal is inadequate for assessing PA pressure. Left Atrium: Left atrial size was normal in size. Right Atrium: Right atrial size was normal in size. Pericardium: There is no evidence of pericardial effusion. Mitral Valve: The mitral valve is normal in structure. No evidence of mitral valve regurgitation. No evidence of mitral valve stenosis.  Tricuspid Valve: The tricuspid valve is normal in structure. Tricuspid valve regurgitation is trivial. No evidence of tricuspid stenosis. Aortic Valve: The aortic valve is normal in structure. Aortic valve regurgitation is not visualized. No aortic stenosis is present. Pulmonic Valve: The pulmonic valve was normal in structure. Pulmonic valve regurgitation is not visualized. No evidence of pulmonic stenosis. Aorta: The aortic root is normal in size and structure. Venous: The inferior vena cava is normal in size with greater than 50% respiratory variability, suggesting right atrial pressure of 3 mmHg. IAS/Shunts: No atrial level shunt detected by color flow Doppler.  LEFT VENTRICLE PLAX 2D LVIDd:         4.20 cm   Diastology LVIDs:         2.40 cm   LV e' medial:   8.55 cm/s LV PW:         1.00 cm   LV E/e' medial: 7.3 LV IVS:        0.90 cm LVOT diam:     2.00 cm LVOT Area:     3.14 cm?  RIGHT VENTRICLE RV S prime:     16.20 cm/s LEFT ATRIUM           Index LA diam:      2.30 cm 1.16 cm/m? LA Vol (A4C): 34.7 ml 17.50 ml/m?   AORTA Ao Root diam: 3.30 cm Ao Asc diam:  2.70 cm MITRAL VALVE MV Area (PHT): 2.77 cm?    SHUNTS MV Decel Time: 274 msec    Systemic Diam: 2.00 cm MV E velocity: 62.40 cm/s MV A velocity: 67.60 cm/s MV E/A ratio:  0.92 Kardie Tobb DO Electronically signed by Berniece Salines DO Signature Date/Time: 01/02/2022/3:02:03 PM    Final    ? ?Procedures ?Dr. Marcelino Scot (12/29/2021) -  ?OPEN REDUCTION INTERNAL FIXATION OF RIGHT TRIMALLEOLAR ANKLE FRACTURE WITHOUT FIXATION OF THE POSTERIOR LIP ?OPEN REDUCTION INTERNAL FIXATION OF THE SYNDESMOSIS ?MANUAL APPLICATION OF STRESS ANKLE SYNDESMOSIS UNDER FLUOROSCOPY ? ?Hospital Course:  ?Terri Drake is a 61yo female PMH paroxysmal A Fib who presented to [facility] 5/9 as a level 2 trauma after being hit by a tree branch.   Patient states that she was gardening with a community garden project at the [employer] when a large open branch fell from the sky and crushed her  from behind.  She arrives with complaints of left shoulder pain, left back pain, difficulty breathing and right ankle pain.  She is alert and oriented on arrival with stable vital signs. Workup showed the above listed injuries. Trauma asked to see for admission.   ? ?T11/12 endplate fracture  ?Neurosurgery was consulted and recommended nonoperative management in lumbar corset PRN comfort when out of bed. Follow up with Dr. Kathyrn Sheriff. ? ?Right ankle fracture/dislocation  ?Reduced in the ED by EDP. Orthopedics was consulted and took the patient to the OR  5/11 for ORIF. Postoperatively they recommended NWB RLE 8 weeks. Splint x 2 weeks then covert to CAM for ROM  exercises. Recommend xarelto x30 days at discharge for DVT prophylaxis. Follow up with Dr. Marcelino Scot. ? 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 Neuropathy improving at time of discharge. ? ?Left rib fracture 2 and 6 with Left pneumothorax  ?Multimodal pain control and pulmonary toilet. Follow up chest xray 5/10 and 5/10 negative for pneumothorax. ? ?Left scapula fracture  ?Orthopedics consulted and recommended nonoperative management with sling, WBAT LUE and ROM as tolerated. Follow up with Dr. Marcelino Scot. ? ?Abrasions to Left shoulder and Left knee  ?Local wound care ? ?Atrial fibrillation with intermittent RVR  ?Patient with h/o paroxysmal atrial fibrillation at baseline maintained on metoprolol and flecainide. Noted to have runs of Afib up to 130s, patient asymptomatic during these episodes. Cardiology consulted and titrated metoprolol up to 50mg  BID at time of discharge. ECHO was obtained 5/15 and revealed no acute abnormalities, ejection fraction 65 to 70%. They suspect episodes will become less frequent once she recovers more from a trauma standpoint. Follow up with cardiology as outpatient. ? ?ABL  anemia  ?Patient noted to have acute blood loss anemia with hemoglobin as low as 10.6. She did not require a blood transfusion and h/h improved with hgb 13.5 on 01/02/22. ? ?Urinary retention ?Foley cat

## 2022-01-04 NOTE — Discharge Instructions (Addendum)
Inpatient Rehab Discharge Instructions  CHANDAL LARE Discharge date and time: No discharge date for patient encounter.   Activities/Precautions/ Functional Status: Activity:  Nonweightbearing right lower extremity.  Weightbearing as tolerated left upper extremity Diet: regular diet Wound Care: Routine skin checks Functional status:  ___ No restrictions     ___ Walk up steps independently ___ 24/7 supervision/assistance   ___ Walk up steps with assistance ___ Intermittent supervision/assistance  ___ Bathe/dress independently ___ Walk with walker     _x__ Bathe/dress with assistance ___ Walk Independently    ___ Shower independently ___ Walk with assistance    ___ Shower with assistance ___ No alcohol     ___ Return to work/school ________  COMMUNITY REFERRALS UPON DISCHARGE:    Home Health:   PT     OT                    Agency: TBD per [employer] Comp Approval  Phone:    Medical Equipment/Items Ordered: Civil engineer, contracting, Quarry manager, Music therapist: Workers Comp   Special Instructions: No driving smoking or alcohol   My questions have been answered and I understand these instructions. I will adhere to these goals and the provided educational materials after my discharge from the hospital.  Patient/Caregiver Signature _______________________________ Date __________  Clinician Signature _______________________________________ Date __________  Please bring this form and your medication list with you to all your follow-up doctor's appointments.   ___________________________________________________________________________________________________ Information on my medicine - XARELTO (Rivaroxaban)  This medication education was reviewed with me or my healthcare representative as part of my discharge preparation.   Why was Xarelto prescribed for you? Xarelto was prescribed for you to reduce the risk of  blood clots forming after orthopedic surgery. The medical term for these abnormal blood clots is venous thromboembolism (VTE).  What do you need to know about xarelto ? Take your Xarelto ONCE DAILY at the same time every day. You may take it either with or without food.  If you have difficulty swallowing the tablet whole, you may crush it and mix in applesauce just prior to taking your dose.  Take Xarelto exactly as prescribed by your doctor and DO NOT stop taking Xarelto without talking to the doctor who prescribed the medication.  Stopping without other VTE prevention medication to take the place of Xarelto may increase your risk of developing a clot.  After discharge, you should have regular check-up appointments with your healthcare provider that is prescribing your Xarelto.    What do you do if you miss a dose? If you miss a dose, take it as soon as you remember on the same day then continue your regularly scheduled once daily regimen the next day. Do not take two doses of Xarelto on the same day.   Important Safety Information A possible side effect of Xarelto is bleeding. You should call your healthcare provider right away if you experience any of the following: Bleeding from an injury or your nose that does not stop. Unusual colored urine (red or dark brown) or unusual colored stools (red or black). Unusual bruising for unknown reasons. A serious fall or if you hit your head (even if there is no bleeding).  Some medicines may interact with Xarelto and might increase your risk of bleeding while on Xarelto. To help avoid this, consult your healthcare provider or pharmacist prior to using any new prescription or non-prescription medications, including herbals, vitamins, non-steroidal anti-inflammatory drugs (NSAIDs) and supplements.  This website has more information on Xarelto: [email or web address]

## 2022-01-04 NOTE — Progress Notes (Addendum)
Inpatient Rehabilitation Admission Medication Review by a Pharmacist ? ?A complete drug regimen review was completed for this patient to identify any potential clinically significant medication issues. ? ?High Risk Drug Classes Is patient taking? Indication by Medication  ?Antipsychotic No   ?Anticoagulant Yes Lovenox- VTE prophylaxis  ?Antibiotic No   ?Opioid Yes OxyIR- acute pain  ?Antiplatelet No   ?Hypoglycemics/insulin No   ?Vasoactive Medication Yes Flecainide- arrhythmia ?Lopressor- rate control / hypertension  ?Chemotherapy No   ?Other Yes Xalatan- glaucoma ?Robaxin- muscle spasms ?Urecholine- urinary retention  ? ? ? ?Type of Medication Issue Identified Description of Issue Recommendation(s)  ?Drug Interaction(s) (clinically significant) ?    ?Duplicate Therapy ?    ?Allergy ?    ?No Medication Administration End Date ?    ?Incorrect Dose ?    ?Additional Drug Therapy Needed ?    ?Significant med changes from prior encounter (inform family/care partners about these prior to discharge).    ?Other ? PTA meds: ?Cutivate Restart PTA meds when and if clinically necessary during CIR admission or at time of discharge, if warranted  ? ? ?Clinically significant medication issues were identified that warrant physician communication and completion of prescribed/recommended actions by midnight of the next day:  No ? ? ?Time spent performing this drug regimen review (minutes):  30 ? ? ?Dynisha Due BS, PharmD, BCPS ?Clinical Pharmacist ?01/04/2022 1:05 PM ? ?Contact: [phone] after 3 PM ? ?"Be curious, not judgmental..." -Debbora Dus ?

## 2022-01-04 NOTE — Progress Notes (Signed)
Patient's Lopressor was titrated to 50 mg twice daily by cardiology services date of admission to inpatient rehab services 01/04/2022

## 2022-01-04 NOTE — H&P (Addendum)
? ? ?Physical Medicine and Rehabilitation Admission H&P ? ?CC: Trimalleolar fracture ? ?HPI: Terri Drake is a 61 year old right-handed female with history of hypertension, occasional alcohol use, atrial fibrillation maintained on Tambocor as well as Toprol.  Per chart review patient lives with spouse.  1 level home 3 steps to entry.  Independent prior to admission.  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  Admission chemistries unremarkable, alcohol negative.  Cranial CT scan as well as CT cervical spine negative.  CT thoracic spine with possible acute mild superior endplate fractures of 624THL and T12.  CT of chest abdomen pelvis showed no evidence of acute solid organ injury.  There was an acute displaced left inferior scapular fracture as well as acute nondisplaced left second rib fracture as well as sixth rib fracture with small amount of soft tissue gas between the first and second ribs on the left side.  Mild left apical pleural thickening suspect secondary small hematoma from adjacent rib trauma.  Trace left apical pneumothorax.  Left shoulder film follow-up identifies comminuted left scapular body fracture with no additional fracture left shoulder.  CT of the right ankle identified displaced and comminuted trimalleolar fracture as well as slight decreased density of the lateral talar dome suspicious for impaction injury.  Underwent open reduction internal fixation of right trimalleolar ankle fracture without fixation of posterior lip/ORIF of the syndemosis 12/29/2021 per Dr. Marcelino Scot.  Nonweightbearing right lower extremity x8 weeks with splint x2 weeks then convert to CAM for range of motion exercises..  In regards to patient's left scapular body fracture weightbearing as tolerated no surgical intervention and sling for comfort.  In regards to patient's T11/12 endplate fracture  discussed with Dr. Kathyrn Sheriff lumbar corset as directed.  Currently maintained on Lovenox for DVT prophylaxis converting to Xarelto 10 mg daily at discharge x30 days.  Hospital course cardiology service did follow-up in regards to patient's history of atrial fibrillation with intermittent RVR and currently remains on flecainide as well as Lopressor 37.5 mg twice daily.  Echocardiogram was completed that showed ejection fraction of 65 to 70% no regional wall motion abnormalities.  Bouts of urinary retention Foley tube had been placed maintained on Urecholine.  Therapy evaluations completed due to patient decreased functional mobility was admitted for a comprehensive rehab program. Currently pain limited. ? ?Review of Systems  ?Constitutional:  Negative for chills and fever.  ?HENT:  Negative for hearing loss.   ?Eyes:  Negative for blurred vision and double vision.  ?Respiratory:  Negative for cough and shortness of breath.   ?Cardiovascular:  Positive for palpitations. Negative for chest pain and leg swelling.  ?Gastrointestinal:  Positive for constipation. Negative for heartburn, nausea and vomiting.  ?Genitourinary:  Negative for dysuria, flank pain and hematuria.  ?Musculoskeletal:  Positive for joint pain and myalgias.  ?Skin:  Negative for rash.  ?All other systems reviewed and are negative. ?Past Medical History:  ?Diagnosis Date  ? Ankle dislocation, right, initial encounter 12/30/2021  ? Ankle syndesmosis disruption, right, initial encounter 12/30/2021  ? Closed displaced trimalleolar fracture of right ankle 12/30/2021  ? Dysrhythmia   ? a-fib  ? Paroxysmal atrial fibrillation (HCC)   ? ?Past Surgical History:  ?Procedure Laterality Date  ? ORIF ANKLE FRACTURE Right 12/29/2021  ? Procedure: OPEN REDUCTION INTERNAL FIXATION (ORIF) ANKLE FRACTURE;  Surgeon: Altamese [place], MD;  Location: [facility];  Service:  Orthopedics;  Laterality: Right;  ? ?History reviewed. No pertinent family history. ?Social History:  reports  that she has never smoked. She has never used smokeless tobacco. She reports current alcohol use of about 5.0 - 10.0 standard drinks per week. She reports that she does not use drugs. ?Allergies:  ?Allergies  ?Allergen Reactions  ? Elemental Sulfur   ?  REACTION: hives  ? Gluten Meal Diarrhea  ? Sulfa Antibiotics Rash  ? ?Medications Prior to Admission  ?Medication Sig Dispense Refill  ? flecainide (TAMBOCOR) 50 MG tablet TAKE 1 TABLET(50 MG) BY MOUTH TWICE DAILY (Patient taking differently: Take 50 mg by mouth 2 (two) times daily. TAKE 1 TABLET(50 MG) BY MOUTH TWICE DAILY) 60 tablet 3  ? fluticasone (CUTIVATE) 0.05 % cream Apply 1 application. topically 2 (two) times daily as needed.    ? latanoprost (XALATAN) 0.005 % ophthalmic solution Place 1 drop into both eyes at bedtime.    ? metoprolol succinate (TOPROL XL) 25 MG 24 hr tablet Take 0.5 tablets (12.5 mg total) by mouth daily. 30 tablet 3  ? ?Home: ?Home Living ?Family/patient expects to be discharged to:: Private residence ?Living Arrangements: Spouse/significant other ?Available Help at Discharge: Family ?Type of Home: House ?Home Access: Stairs to enter, Other (comment) (Working on having ramp built) ?Entrance Stairs-Number of Steps: 3 ?Entrance Stairs-Rails: Can reach both ?Home Layout: One level ?Bathroom Toilet: Standard ?Home Equipment: None ? Lives With: Spouse ?  ?Functional History: ?Prior Function ?Prior Level of Function : Independent/Modified Independent ?  ?Functional Status:  ?Mobility: ?Bed Mobility ?Overal bed mobility: Modified Independent ?Bed Mobility: Supine to Sit ?Supine to sit: Min guard ?General bed mobility comments: OOB in chair ?Transfers ?Overall transfer level: Needs assistance ?Equipment used: Left platform walker ?Transfers: Sit to/from Stand, Bed to chair/wheelchair/BSC ?Sit to Stand: Min assist ?Bed to/from chair/wheelchair/BSC transfer type:: Stand pivot ?Stand pivot transfers: Min assist ?General transfer comment: Light minA  to initially steady, increased time/effort ?Ambulation/Gait ?General Gait Details: unable to progress ?  ?ADL: ?ADL ?Overall ADL's : Needs assistance/impaired ?Eating/Feeding: Independent, Bed level ?Grooming: Brushing hair, Minimal assistance, Sitting, Min guard ?Upper Body Bathing: Moderate assistance ?Lower Body Bathing: Moderate assistance ?Upper Body Dressing : Moderate assistance ?Lower Body Dressing: Bed level, Moderate assistance (donning L sock unassisted) ?Toilet Transfer: Minimal assistance, BSC/3in1, Stand-pivot ?General ADL Comments: educated on don doff brace and how the brace should be positioned. the brace was not don this session as pt was not agreeable to EOB due to fatigue from bed level task prior to OT arrival. pt reports + bowel movement in bed pan today.Session bed level. pt educated and demonstrates HOB increase with remote control this session. pt advised with hob increase brace should be don for upight posture. ?  ?Cognition: ?Cognition ?Overall Cognitive Status: Within Functional Limits for tasks assessed ?Orientation Level: Oriented X4 ?Cognition ?Arousal/Alertness: Awake/alert ?Behavior During Therapy: [facility] for tasks assessed/performed ?Overall Cognitive Status: Within Functional Limits for tasks assessed ?General Comments: pt finishing movement in the bed with RN staff prior to OT session. ?  ? ?Physical Exam: ?Blood pressure (!) 108/46, pulse 62, temperature 98.3 ?F (36.8 ?C), temperature source Oral, resp. rate 16, weight 88.5 kg, SpO2 98 %. ?Gen: no distress, normal appearing ?HEENT: oral mucosa pink and moist, NCAT ?Cardio: Tachycardia ?Chest: normal effort, normal rate of breathing ?Abd: soft, non-distended ?Ext: splint to RLE ?Psych: pleasant, normal affect, tearful temporarily regarding what she has been through ?Skin: ?   Comments: Splint in place to right  lower extremity.  Wound care dressing to left knee abrasion.  ?Neurological:  ?   Comments: Patient is alert.  Oriented x3  and follows commands.  ? ?Results for orders placed or performed during the hospital encounter of 01/04/22 (from the past 48 hour(s))  ?CBC     Status: Abnormal  ? Collection Time: 01/04/22  5:52 AM  ?Result [facility]

## 2022-01-04 NOTE — H&P (Signed)
? ? ?Physical Medicine and Rehabilitation Admission H&P ? ?  ? ?HPI: Terri Drake is a 61 year old right-handed female with history of hypertension, occasional alcohol use, atrial fibrillation maintained on Tambocor as well as Toprol.  Per chart review patient lives with spouse.  1 level home 3 steps to entry.  Independent prior to admission.  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  Admission chemistries unremarkable, alcohol negative.  Cranial CT scan as well as CT cervical spine negative.  CT thoracic spine with possible acute mild superior endplate fractures of 624THL and T12.  CT of chest abdomen pelvis showed no evidence of acute solid organ injury.  There was an acute displaced left inferior scapular fracture as well as acute nondisplaced left second rib fracture as well as sixth rib fracture with small amount of soft tissue gas between the first and second ribs on the left side.  Mild left apical pleural thickening suspect secondary small hematoma from adjacent rib trauma.  Trace left apical pneumothorax.  Left shoulder film follow-up identifies comminuted left scapular body fracture with no additional fracture left shoulder.  CT of the right ankle identified displaced and comminuted trimalleolar fracture as well as slight decreased density of the lateral talar dome suspicious for impaction injury.  Underwent open reduction internal fixation of right trimalleolar ankle fracture without fixation of posterior lip/ORIF of the syndemosis 12/29/2021 per Dr. Marcelino Scot.  Nonweightbearing right lower extremity x8 weeks with splint x2 weeks then convert to CAM for range of motion exercises..  In regards to patient's left scapular body fracture weightbearing as tolerated no surgical intervention and sling for comfort.  In regards to patient's T11/12 endplate fracture discussed with Dr. Kathyrn Sheriff  lumbar corset as directed.  Currently maintained on Lovenox for DVT prophylaxis converting to Xarelto 10 mg daily at discharge x30 days.  Hospital course cardiology service did follow-up in regards to patient's history of atrial fibrillation with intermittent RVR and currently remains on flecainide as well as Lopressor 37.5 mg twice daily.  Echocardiogram was completed that showed ejection fraction of 65 to 70% no regional wall motion abnormalities.  Bouts of urinary retention Foley tube had been placed maintained on Urecholine.  Therapy evaluations completed due to patient decreased functional mobility was admitted for a comprehensive rehab program. ? ?Review of Systems  ?Constitutional:  Negative for chills and fever.  ?HENT:  Negative for hearing loss.   ?Eyes:  Negative for blurred vision and double vision.  ?Respiratory:  Negative for cough and shortness of breath.   ?Cardiovascular:  Positive for palpitations. Negative for chest pain and leg swelling.  ?Gastrointestinal:  Positive for constipation. Negative for heartburn, nausea and vomiting.  ?Genitourinary:  Negative for dysuria, flank pain and hematuria.  ?Musculoskeletal:  Positive for joint pain and myalgias.  ?Skin:  Negative for rash.  ?All other systems reviewed and are negative. ?Past Medical History:  ?Diagnosis Date  ? Ankle dislocation, right, initial encounter 12/30/2021  ? Ankle syndesmosis disruption, right, initial encounter 12/30/2021  ? Closed displaced trimalleolar fracture of right ankle 12/30/2021  ? Dysrhythmia   ? a-fib  ? Paroxysmal atrial fibrillation (HCC)   ? ?Past Surgical History:  ?Procedure Laterality Date  ? ORIF ANKLE FRACTURE Right 12/29/2021  ? Procedure: OPEN REDUCTION INTERNAL FIXATION (ORIF) ANKLE FRACTURE;  Surgeon: Altamese Tontogany, MD;  Location: [place];  Service: Orthopedics;  Laterality: Right;  ? ?  History reviewed. No pertinent family history. ?Social History:  reports that she has never smoked. She has never used smokeless  tobacco. She reports current alcohol use of about 5.0 - 10.0 standard drinks per week. She reports that she does not use drugs. ?Allergies:  ?Allergies  ?Allergen Reactions  ? Elemental Sulfur   ?  REACTION: hives  ? Gluten Meal Diarrhea  ? Sulfa Antibiotics Rash  ? ?Medications Prior to Admission  ?Medication Sig Dispense Refill  ? flecainide (TAMBOCOR) 50 MG tablet TAKE 1 TABLET(50 MG) BY MOUTH TWICE DAILY (Patient taking differently: Take 50 mg by mouth 2 (two) times daily. TAKE 1 TABLET(50 MG) BY MOUTH TWICE DAILY) 60 tablet 3  ? fluticasone (CUTIVATE) 0.05 % cream Apply 1 application. topically 2 (two) times daily as needed.    ? latanoprost (XALATAN) 0.005 % ophthalmic solution Place 1 drop into both eyes at bedtime.    ? metoprolol succinate (TOPROL XL) 25 MG 24 hr tablet Take 0.5 tablets (12.5 mg total) by mouth daily. 30 tablet 3  ? ? ? ? ?Home: ?Home Living ?Family/patient expects to be discharged to:: Private residence ?Living Arrangements: Spouse/significant other ?Available Help at Discharge: Family ?Type of Home: House ?Home Access: Stairs to enter, Other (comment) (Working on having ramp built) ?Entrance Stairs-Number of Steps: 3 ?Entrance Stairs-Rails: Can reach both ?Home Layout: One level ?Bathroom Toilet: Standard ?Home Equipment: None ? Lives With: Spouse ?  ?Functional History: ?Prior Function ?Prior Level of Function : Independent/Modified Independent ? ?Functional Status:  ?Mobility: ?Bed Mobility ?Overal bed mobility: Modified Independent ?Bed Mobility: Supine to Sit ?Supine to sit: Min guard ?General bed mobility comments: OOB in chair ?Transfers ?Overall transfer level: Needs assistance ?Equipment used: Left platform walker ?Transfers: Sit to/from Stand, Bed to chair/wheelchair/BSC ?Sit to Stand: Min assist ?Bed to/from chair/wheelchair/BSC transfer type:: Stand pivot ?Stand pivot transfers: Min assist ?General transfer comment: Light minA to initially steady, increased  time/effort ?Ambulation/Gait ?General Gait Details: unable to progress ?  ? ?ADL: ?ADL ?Overall ADL's : Needs assistance/impaired ?Eating/Feeding: Independent, Bed level ?Grooming: Brushing hair, Minimal assistance, Sitting, Min guard ?Upper Body Bathing: Moderate assistance ?Lower Body Bathing: Moderate assistance ?Upper Body Dressing : Moderate assistance ?Lower Body Dressing: Bed level, Moderate assistance (donning L sock unassisted) ?Toilet Transfer: Minimal assistance, BSC/3in1, Stand-pivot ?General ADL Comments: educated on don doff brace and how the brace should be positioned. the brace was not don this session as pt was not agreeable to EOB due to fatigue from bed level task prior to OT arrival. pt reports + bowel movement in bed pan today.Session bed level. pt educated and demonstrates HOB increase with remote control this session. pt advised with hob increase brace should be don for upight posture. ? ?Cognition: ?Cognition ?Overall Cognitive Status: Within Functional Limits for tasks assessed ?Orientation Level: Oriented X4 ?Cognition ?Arousal/Alertness: Awake/alert ?Behavior During Therapy: [facility] for tasks assessed/performed ?Overall Cognitive Status: Within Functional Limits for tasks assessed ?General Comments: pt finishing movement in the bed with RN staff prior to OT session. ? ?Physical Exam: ?Blood pressure (!) 148/75, pulse (!) 108, temperature 98.5 ?F (36.9 ?C), temperature source Oral, resp. rate 16, height 5\' 7"  (1.702 m), weight 86.6 kg, SpO2 96 %. ?Gen: no distress, normal appearing ?HEENT: oral mucosa pink and moist, NCAT ?Cardio: Tachycardia ?Chest: normal effort, normal rate of breathing ?Abd: soft, non-distended ?Ext: splint to RLE ?Psych: pleasant, normal affect, tearful temporarily regarding what she has been through ?Skin: ?   Comments: Splint in place to right  lower extremity.  Wound care dressing to left knee abrasion.  ?Neurological:  ?   Comments: Patient is alert.  Oriented x3 and  follows commands.  ? ?Results for orders placed or performed during the hospital encounter of 12/27/21 (from the past 48 hour(s))  ?Basic metabolic panel     Status: Abnormal  ? Collection Time: 01/02/22 10:06 AM  ?Result

## 2022-01-04 NOTE — Progress Notes (Signed)
PMR Admission Coordinator Pre-Admission Assessment ?  ?Patient: Terri Drake is an 61 y.o., female ?MRN: [id number] ?DOB: Jun 27, 1961 ?Height: 5\' 7"  (170.2 cm) ?Weight: 86.6 kg ?  ?Insurance Information ?HMO:     PPO:      PCP:      IPA:      80/20:      OTHER:  ?PRIMARY: Worker's Comp      Policy#: [id number]      Subscriber: Pt ?CM Name:  Bernadette Hoit with [facility]     Phone#: [phone]     Fax#: [phone] ?Pre-Cert#: [id number]  (case #)          Employer:  CM stated on 5/16 that Pt. Approved for 14 days. She requests that an admission note be faxed to her and that updates be sent on every 4th day (first one will be due 01/07/22) ?Benefits:  Phone #:      Name:  ?Eff. Date:      Deduct:       Out of Pocket Max:       Life Max:  ?CIR:       SNF:  ?Outpatient:      Co-Pay:  ?Home Health:       Co-Pay:  ?DME:      Co-Pay:  ?Providers:  ?SECONDARY:       Policy#:      Phone#:  ?  ?Financial Counselor:       Phone#:  ?  ?The ?Data Collection Information Summary? for patients in Inpatient Rehabilitation Facilities with attached ?Privacy Act [place] Records? was provided and verbally reviewed with: Patient ?  ?Emergency Contact Information ?Contact Information   ?  ?  Name Relation Home Work Mobile  ?  Johnston,Timothy Spouse [id number]   [phone]  ?  ?   ?  ?  ?Current Medical History  ?Patient Admitting Diagnosis: Polytrauma  ?History of Present Illness: Ameya Abascal. Pena is a 61 year old right-handed female with history of hypertension, occasional alcohol use, atrial fibrillation maintained on Tambocor as well as Toprol.  Per chart review patient lives with spouse.  1 level home 3 steps to entry.  Independent prior to admission.  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  Admission chemistries unremarkable, alcohol negative.  Cranial CT scan as well as CT  cervical spine negative.  CT thoracic spine with possible acute mild superior endplate fractures of 624THL and T12.  CT of chest abdomen pelvis showed no evidence of acute solid organ injury.  There was an acute displaced left inferior scapular fracture as well as acute nondisplaced left second rib fracture as well as sixth rib fracture with small amount of soft tissue gas between the first and second ribs on the left side.  Mild left apical pleural thickening suspect secondary small hematoma from adjacent rib trauma.  Trace left apical pneumothorax.  Left shoulder film follow-up identifies comminuted left scapular body fracture with no additional fracture left shoulder.  CT of the right ankle identified displaced and comminuted trimalleolar fracture as well as slight decreased density of the lateral talar dome suspicious for impaction injury.  Underwent open reduction internal fixation of right trimalleolar ankle fracture without fixation of posterior lip/ORIF of the syndemosis 12/29/2021 per Dr. Marcelino Scot.  Nonweightbearing right lower extremity x8 weeks with splint x2 weeks then convert to CAM for range of motion exercises.Marland Kitchen  In regards to patient's left scapular body fracture weightbearing as tolerated no surgical intervention and sling for comfort.  In regards to patient's T11/12 endplate fracture discussed with Dr. Kathyrn Sheriff lumbar corset as directed.  Currently maintained on Lovenox for DVT prophylaxis converting to Xarelto 10 mg daily at discharge x30 days.  Hospital course cardiology service did follow-up in regards to patient's history of atrial fibrillation with intermittent RVR and currently remains on flecainide as well as Lopressor 37.5 mg twice daily.  Echocardiogram was completed that showed ejection fraction of 65 to 70% no regional wall motion abnormalities.  Bouts of urinary retention Foley tube had been placed maintained on Urecholine.  Therapy evaluations completed due to patient decreased functional  mobility was admitted for a comprehensive rehab program. ?  ?Patient's medical record from [facility] has been reviewed by the rehabilitation admission coordinator and physician. ?  ?Past Medical History  ?    ?Past Medical History:  ?Diagnosis Date  ? Ankle dislocation, right, initial encounter 12/30/2021  ? Ankle syndesmosis disruption, right, initial encounter 12/30/2021  ? Closed displaced trimalleolar fracture of right ankle 12/30/2021  ? Dysrhythmia    ?  a-fib  ? Paroxysmal atrial fibrillation (HCC)    ?  ?  ?Has the patient had major surgery during 100 days prior to admission? Yes ?  ?Family History   ?family history is not on file. ?  ?Current Medications ?  ?Current Facility-Administered Medications:  ?  0.9 %  sodium chloride infusion, , Intravenous, Continuous, Meuth, Brooke A, PA-C, Last Rate: 10 mL/hr at 01/03/22 1052, Rate Change at 01/03/22 1052 ?  acetaminophen (TYLENOL) tablet 1,000 mg, 1,000 mg, Oral, Q6H, Ainsley Spinner, PA-C, 1,000 mg at 01/03/22 1059 ?  ascorbic acid (VITAMIN C) tablet 500 mg, 500 mg, Oral, Daily, Ainsley Spinner, PA-C, 500 mg at 01/03/22 [id number] ?  bacitracin ointment, , Topical, BID, Ainsley Spinner, PA-C, 0000000 application. at 01/03/22 0843 ?  bethanechol (URECHOLINE) tablet 25 mg, 25 mg, Oral, TID, Jillyn Ledger, PA-C, 25 mg at 01/03/22 0845 ?  Chlorhexidine Gluconate Cloth 2 % PADS 6 each, 6 each, Topical, Daily, Ainsley Spinner, PA-C, 6 each at 01/03/22 [id number] ?  docusate sodium (COLACE) capsule 100 mg, 100 mg, Oral, BID, Ainsley Spinner, PA-C, 100 mg at 01/03/22 [id number] ?  enoxaparin (LOVENOX) injection 30 mg, 30 mg, Subcutaneous, Q12H, Ainsley Spinner, PA-C, 30 mg at 01/03/22 0844 ?  flecainide (TAMBOCOR) tablet 100 mg, 100 mg, Oral, Q12H, Isaiah Serge, NP, 100 mg at 01/03/22 0845 ?  ketorolac (TORADOL) 15 MG/ML injection 15 mg, 15 mg, Intravenous, Q6H PRN, Johnathan Hausen, MD, 15 mg at 01/01/22 0920 ?  latanoprost (XALATAN) 0.005 % ophthalmic solution 1 drop, 1 drop, Both Eyes,  QHS, Ainsley Spinner, PA-C, 1 drop at 01/02/22 2157 ?  lidocaine (LIDODERM) 5 % 1 patch, 1 patch, Transdermal, Q24H, Ainsley Spinner, PA-C, 1 patch at 01/01/22 2109 ?  methocarbamol (ROBAXIN) tablet 1,000 mg, 1,000 mg, Oral, QID, Ainsley Spinner, PA-C, 1,000 mg at 01/02/22 [id number] ?  metoprolol tartrate (LOPRESSOR) injection 5 mg, 5 mg, Intravenous, Q4H PRN, Jesusita Oka, MD, 5 mg at 01/03/22 1340 ?  metoprolol tartrate (LOPRESSOR) tablet 37.5 mg, 37.5 mg, Oral, BID, Pemberton, Heather E, MD ?  morphine (PF) 2 MG/ML injection 2 mg, 2 mg, Intravenous, Q2H PRN, Maczis, Barth Kirks, PA-C ?  oxyCODONE (Oxy IR/ROXICODONE) immediate release tablet 5-10 mg, 5-10 mg, Oral, Q4H PRN, Jillyn Ledger, PA-C, 5 mg at 01/02/22 0910 ?  polyethylene glycol (MIRALAX / GLYCOLAX) packet 17 g, 17 g, Oral, Daily, Meuth, Brooke A, PA-C ?  zinc sulfate capsule 220 mg, 220 mg, Oral, Daily, Ainsley Spinner, PA-C, 220 mg at 01/03/22 [id number] ?  ?Patients Current Diet:  ?Diet Order   ?  ?         ?    Diet gluten free Room service appropriate? Yes; Fluid consistency: Thin  Diet effective now       ?  ?  ?   ?  ?  ?   ?  ?  ?Precautions / Restrictions ?Precautions ?Precautions: Fall, Back ?Precaution Comments: orthostatic hypotension ?Other Brace: LSO for OOB, sling for comfort (allowed to move LUE) ?Restrictions ?Weight Bearing Restrictions: Yes ?LUE Weight Bearing: Weight bearing as tolerated ?RLE Weight Bearing: Non weight bearing  ?  ?Has the patient had 2 or more falls or a fall with injury in the past year? Yes  ?  ?Prior Activity Level ? Pt. Was active in the community PTA  ?  ?Prior Functional Level ?Self Care: Did the patient need help bathing, dressing, using the toilet or eating? Independent ?  ?Indoor Mobility: Did the patient need assistance with walking from room to room (with or without device)? Independent ?  ?Stairs: Did the patient need assistance with internal or external stairs (with or without device)? Independent ?  ?Functional Cognition:  Did the patient need help planning regular tasks such as shopping or remembering to take medications? Independent ?  ?Patient Information ?Are you of Hispanic, Latino/a,or Spanish origin?: A. No, not of Hispan

## 2022-01-04 NOTE — Progress Notes (Signed)
Patient transferred to 4w11 via bed with all belongings. ?

## 2022-01-04 NOTE — Progress Notes (Signed)
Physical Therapy Treatment ?Patient Details ?Name: Terri Drake ?MRN: [id number] ?DOB: 01-Dec-1960 ?Today's Date: 01/04/2022 ? ? ?History of Present Illness Pt is 61 yr old F admitted on 12/27/21 with obvious ankle deformity after being struck by tree branch. Imaging (+) for R trimalleolar fx; displaced L inferior scapular fx; L 2nd + 6th rib fx; possible T11-12 fx. Underwent R ankle ORIF on 5/11. PMH: a-fib ? ?  ?PT Comments  ? ? Pt received OOB on [facility] and motivated for participation, however limited by orthostasis (see table), RN notified. Session focused on continued progression of transfer training, standing exercise and pre-gait. Pt able to come to standing with min guard x5 throughout session demonstrating good hand placement and stability. Pt requiring min assist to steady to stand pivot transfer from BSC>recliner. Pt with fair tolerance for LE therex for increased strength to ultimately progress gait, but limited by dizziness and BP. Current plan remains appropriate to address deficits and maximize functional independence and decrease caregiver burden. Pt continues to benefit from skilled PT services to progress toward functional mobility goals.   ? ?BP Readings: (HR stable 68-75 throughout)  ?Sitting: 112/74  ?Standing 2 mins: 57/33 ?Reclined in chair: 92/56 ?Recovery: 106/65 ?  ?Recommendations for follow up therapy are one component of a multi-disciplinary discharge planning process, led by the attending physician.  Recommendations may be updated based on patient status, additional functional criteria and insurance authorization. ? ?Follow Up Recommendations ? Acute inpatient rehab (3hours/day) ?  ?  ?Assistance Recommended at Discharge Frequent or constant Supervision/Assistance  ?Patient can return home with the following A lot of help with walking and/or transfers;A lot of help with bathing/dressing/bathroom;Assist for transportation;Assistance with cooking/housework;Help with stairs or ramp for  entrance ?  ?Equipment Recommendations ? Rolling walker (2 wheels);Wheelchair (measurements PT);BSC/3in1 (L platform RW)  ?  ?Recommendations for Other Services   ? ? ?  ?Precautions / Restrictions Precautions ?Precautions: Fall;Back ?Precaution Comments: orthostatic hypotension 5/15 ?Required Braces or Orthoses: Sling;Other Brace ?Other Brace: LSO for OOB, sling for comfort (allowed to move LUE) ?Restrictions ?Weight Bearing Restrictions: Yes ?LUE Weight Bearing: Weight bearing as tolerated ?RLE Weight Bearing: Non weight bearing  ?  ? ?Mobility ? Bed Mobility ?Overal bed mobility: Modified Independent ?  ?  ?  ?  ?  ?  ?General bed mobility comments: OOB on BSC on arrival ?  ? ?Transfers ?Overall transfer level: Needs assistance ?Equipment used: Left platform walker ?Transfers: Sit to/from Stand, Bed to chair/wheelchair/BSC ?Sit to Stand: Min guard ?Stand pivot transfers: Min assist ?  ?  ?  ?  ?General transfer comment: Light minA to initially steady during pivot, increased time/effort ?  ? ?Ambulation/Gait ?  ?  ?  ?  ?  ?  ?  ?General Gait Details: unable to progress ? ? ?Stairs ?  ?  ?  ?  ?  ? ? ?Wheelchair Mobility ?  ? ?Modified Rankin (Stroke Patients Only) ?  ? ? ?  ?Balance Overall balance assessment: Needs assistance ?Sitting-balance support: No upper extremity supported ?Sitting balance-Leahy Scale: Good ?Sitting balance - Comments: brace donned sitting EOB ?  ?Standing balance support: Bilateral upper extremity supported ?Standing balance-Leahy Scale: Poor ?Standing balance comment: dependent on BUE support due to NWB RLE ?  ?  ?  ?  ?  ?  ?  ?  ?  ?  ?  ?  ? ?  ?Cognition Arousal/Alertness: Awake/alert ?Behavior During Therapy: [facility] for tasks assessed/performed ?Overall Cognitive Status: Within  Functional Limits for tasks assessed ?  ?  ?  ?  ?  ?  ?  ?  ?  ?  ?  ?  ?  ?  ?  ?  ?  ?  ?  ? ?  ?Exercises General Exercises - Upper Extremity ?Shoulder Flexion: Left, Seated, Self ROM, 5 reps ?General  Exercises - Lower Extremity ?Heel Raises: 10 reps, Standing ?Mini-Sqauts: 10 reps, Standing (LLE single leg squats) ?Other Exercises ?Other Exercises: Sit to stands x5 ? ?  ?General Comments   ?  ?  ? ?Pertinent Vitals/Pain Pain Assessment ?Pain Assessment: Faces ?Faces Pain Scale: Hurts a little bit ?Pain Location: L scapula ?Pain Descriptors / Indicators: Aching, Grimacing ?Pain Intervention(s): Limited activity within patient's tolerance, Monitored during session, Repositioned  ? ? ?Home Living   ?  ?  ?  ?  ?  ?  ?  ?  ?  ?   ?  ?Prior Function    ?  ?  ?   ? ?PT Goals (current goals can now be found in the care plan section) Acute Rehab PT Goals ?Patient Stated Goal: Pt's goal is to return to independence and return home. ?PT Goal Formulation: With patient/family ?Time For Goal Achievement: 01/13/22 ? ?  ?Frequency ? ? ? Min 5X/week ? ? ? ?  ?PT Plan Current plan remains appropriate  ? ? ?Co-evaluation   ?  ?  ?  ?  ? ?  ?AM-PAC PT "6 Clicks" Mobility   ?Outcome Measure ? Help needed turning from your back to your side while in a flat bed without using bedrails?: A Little ?Help needed moving from lying on your back to sitting on the side of a flat bed without using bedrails?: A Little ?Help needed moving to and from a bed to a chair (including a wheelchair)?: A Lot ?Help needed standing up from a chair using your arms (e.g., wheelchair or bedside chair)?: A Little ?Help needed to walk in hospital room?: Total ?Help needed climbing 3-5 steps with a railing? : Total ?6 Click Score: 13 ? ?  ?End of Session Equipment Utilized During Treatment: Gait belt ?Activity Tolerance: Patient tolerated treatment well;Treatment limited secondary to medical complications (Comment) (orthostatic hypotension) ?Patient left: in chair;with call bell/phone within reach ?Nurse Communication: Mobility status (BP) ?PT Visit Diagnosis: Pain;Other abnormalities of gait and mobility (R26.89) ?Pain - Right/Left: Left ?Pain - part of body:  Shoulder ?  ? ? ?Time: [id number] ?PT Time Calculation (min) (ACUTE ONLY): 44 min ? ?Charges:  $Therapeutic Exercise: 8-22 mins ?$Therapeutic Activity: 23-37 mins          ?          ? ?Audry Riles. PTA ?Acute Rehabilitation Services ?Office: [phone] ? ? ?Betsey Holiday Shanavia Makela ?01/04/2022, 11:07 AM ? ?

## 2022-01-04 NOTE — Progress Notes (Signed)
Inpatient Rehab Admissions Coordinator:  ? ?I have a bed for this Pt. On CIR. RN may call report to [phone]. ? ?Clemens Catholic, MS, CCC-SLP ?Rehab Admissions Coordinator  ?[phone] (celll) ?[phone] (office) ? ?

## 2022-01-04 NOTE — Progress Notes (Signed)
Patient admitted to CIR 4w-11. Performed skin assessment. Cast in place to RLE. Extremity elevated for comfort and to decrease swelling. Please see LDA/flowsheet for all other skin concerns. Patient was oriented to unit. Call bell within reach. Bed to lowest setting with alarms in place for safety.   Patient voiced concern about urecholine as she states she has been voiding and may not require this anymore. Notified PA. Plan is to taper dose. Patient notified.

## 2022-01-04 NOTE — TOC CAGE-AID Note (Addendum)
Transition of Care (TOC) - CAGE-AID Screening ? ? ?Patient Details  ?Name: GRACELYNNE BENEDICT ?MRN: [id number] ?Date of Birth: 18-Oct-1960 ? ?Transition of Care (TOC) CM/SW Contact:    ?Chason Mciver C Tarpley-Carter, LCSWA ?Phone Number: ?01/04/2022, 2:10 PM ? ? ?Clinical Narrative: ?Pt participated in Cage-Aid.  Pt stated she does not use substance or ETOH.  Pt was not offered resources, due to no usage of substance or ETOH.   ? ?Insurance underwriter, MSW, LCSW-A ?Pronouns:  She/Her/Hers ?[facility] ?Clinical Social Worker ?Direct Number:  [phone] ?[email or web address] ? ?CAGE-AID Screening: ?  ? ?Have You Ever Felt You Ought to Cut Down on Your Drinking or Drug Use?: No ?Have People Annoyed You By Critizing Your Drinking Or Drug Use?: No ?Have You Felt Bad Or Guilty About Your Drinking Or Drug Use?: No ?Have You Ever Had a Drink or Used Drugs First Thing In The Morning to Steady Your Nerves or to Get Rid of a Hangover?: No ?CAGE-AID Score: 0 ? ?Substance Abuse Education Offered: No ? ?  ? ? ? ? ? ? ?

## 2022-01-04 NOTE — Progress Notes (Signed)
Report called to [facility]. Spoke with nurse brittany who verbalized understanding of report and had no further questions. Pt will be transferred once room is finished being cleaned.  ?

## 2022-01-05 ENCOUNTER — Inpatient Hospital Stay (HOSPITAL_COMMUNITY): Payer: No Typology Code available for payment source

## 2022-01-05 DIAGNOSIS — R001 Bradycardia, unspecified: Secondary | ICD-10-CM

## 2022-01-05 DIAGNOSIS — I951 Orthostatic hypotension: Secondary | ICD-10-CM

## 2022-01-05 DIAGNOSIS — S82851A Displaced trimalleolar fracture of right lower leg, initial encounter for closed fracture: Secondary | ICD-10-CM

## 2022-01-05 DIAGNOSIS — R7401 Elevation of levels of liver transaminase levels: Secondary | ICD-10-CM

## 2022-01-05 LAB — COMPREHENSIVE METABOLIC PANEL
ALT: 107 U/L — ABNORMAL HIGH (ref 0–44)
AST: 59 U/L — ABNORMAL HIGH (ref 15–41)
Albumin: 3 g/dL — ABNORMAL LOW (ref 3.5–5.0)
Alkaline Phosphatase: 94 U/L (ref 38–126)
Anion gap: 7 (ref 5–15)
BUN: 15 mg/dL (ref 8–23)
CO2: 23 mmol/L (ref 22–32)
Calcium: 8.8 mg/dL — ABNORMAL LOW (ref 8.9–10.3)
Chloride: 107 mmol/L (ref 98–111)
Creatinine, Ser: 0.78 mg/dL (ref 0.44–1.00)
GFR, Estimated: >60 mL/min (ref >60)
Glucose, Bld: 110 mg/dL — ABNORMAL HIGH (ref 70–99)
Potassium: 3.9 mmol/L (ref 3.5–5.1)
Sodium: 137 mmol/L (ref 135–145)
Total Bilirubin: 1.3 mg/dL — ABNORMAL HIGH (ref 0.3–1.2)
Total Protein: 5.7 g/dL — ABNORMAL LOW (ref 6.5–8.1)

## 2022-01-05 LAB — CBC WITH DIFFERENTIAL/PLATELET
Abs Immature Granulocytes: 0.04 10*3/uL (ref 0.00–0.07)
Basophils Absolute: 0 10*3/uL (ref 0.0–0.1)
Basophils Relative: 0 %
Eosinophils Absolute: 0.1 10*3/uL (ref 0.0–0.5)
Eosinophils Relative: 2 %
HCT: 33.6 % — ABNORMAL LOW (ref 36.0–46.0)
Hemoglobin: 11.8 g/dL — ABNORMAL LOW (ref 12.0–15.0)
Immature Granulocytes: 1 %
Lymphocytes Relative: 25 %
Lymphs Abs: 2.2 10*3/uL (ref 0.7–4.0)
MCH: 34.2 pg — ABNORMAL HIGH (ref 26.0–34.0)
MCHC: 35.1 g/dL (ref 30.0–36.0)
MCV: 97.4 fL (ref 80.0–100.0)
Monocytes Absolute: 0.8 10*3/uL (ref 0.1–1.0)
Monocytes Relative: 9 %
Neutro Abs: 5.6 10*3/uL (ref 1.7–7.7)
Neutrophils Relative %: 63 %
Platelets: 304 10*3/uL (ref 150–400)
RBC: 3.45 MIL/uL — ABNORMAL LOW (ref 3.87–5.11)
RDW: 13.3 % (ref 11.5–15.5)
WBC: 8.7 10*3/uL (ref 4.0–10.5)
nRBC: 0 % (ref 0.0–0.2)

## 2022-01-05 LAB — MAGNESIUM: Magnesium: 2.2 mg/dL (ref 1.7–2.4)

## 2022-01-05 MED ORDER — METHOCARBAMOL 500 MG PO TABS: 1000.0000 mg | ORAL_TABLET | Freq: Four times a day (QID) | ORAL | Status: DC | PRN

## 2022-01-05 MED ORDER — BETHANECHOL CHLORIDE 10 MG PO TABS
10.0000 mg | ORAL_TABLET | Freq: Three times a day (TID) | ORAL | Status: DC
  Administered 2022-01-05 – 2022-01-06 (×3): 10 mg via ORAL
  Filled 2022-01-05 (×3): qty 1

## 2022-01-05 MED ORDER — METOPROLOL TARTRATE 25 MG PO TABS
37.5000 mg | ORAL_TABLET | Freq: Two times a day (BID) | ORAL | Status: DC
  Administered 2022-01-05: 37.5 mg via ORAL
  Filled 2022-01-05: qty 1

## 2022-01-05 NOTE — Progress Notes (Signed)
   01/05/22 1047  Assess: MEWS Score  Temp 97.7 F (36.5 C)  BP (!) 92/51  Pulse Rate (!) 50  Resp 17  Level of Consciousness Alert  SpO2 98 %  O2 Device Room Air  Assess: if the MEWS score is Yellow or Red  Were vital signs taken at a resting state? Yes  Focused Assessment Change from prior assessment (see assessment flowsheet)  Early Detection of Sepsis Score *See Row Information* Low  MEWS guidelines implemented *See Row Information* Yes  Treat  MEWS Interventions Other (Comment)  Pain Scale 0-10  Pain Score 7  Pain Type Acute pain  Pain Location Leg  Pain Orientation Right  Pain Descriptors / Indicators Aching  Pain Frequency Constant  Pain Onset On-going  Pain Intervention(s) Medication (See eMAR)  2nd Pain Site  Pain Location Shoulder  Pain Orientation Left  Take Vital Signs  Increase Vital Sign Frequency  Yellow: Q 2hr X 2 then Q 4hr X 2, if remains yellow, continue Q 4hrs  Escalate  MEWS: Escalate Yellow: discuss with charge nurse/RN and consider discussing with provider and RRT  Notify: Charge Nurse/RN  Name of Charge Nurse/RN Notified Hanif Radin RN  Date Charge Nurse/RN Notified 01/05/22  Time Charge Nurse/RN Notified 1047  Notify: Provider  Provider Name/Title Dr. Artemio Aly PA  Date Provider Notified 01/05/22  Time Provider Notified 1047  Method of Notification Face-to-face  Notification Reason Change in status  Provider response See new orders  Date of Provider Response 01/05/22  Time of Provider Response 1047  Document  Patient Outcome Other (Comment) (stayed in unit; orders noted; MD aware)  Progress note created (see row info) Yes

## 2022-01-05 NOTE — Progress Notes (Signed)
PROGRESS NOTE   Subjective/Complaints: Hypotensive with therapy: ordered TED hose for left leg and abdominal binder for use during therapy. Discussed should be removed after therapy  ROS: +dizziness with low blood pressure   Objective:   No results found. Recent Labs    01/04/22 0552 01/05/22 0540  WBC 7.7 8.7  HGB 12.4 11.8*  HCT 34.9* 33.6*  PLT 300 304   Recent Labs    01/04/22 0552 01/05/22 0540  NA 137 137  K 4.2 3.9  CL 105 107  CO2 23 23  GLUCOSE 106* 110*  BUN 11 15  CREATININE 0.73 0.78  CALCIUM 9.1 8.8*    Intake/Output Summary (Last 24 hours) at 01/05/2022 1235 Last data filed at 01/05/2022 [id number] Gross per 24 hour  Intake 480 ml  Output --  Net 480 ml        Physical Exam: Vital Signs Blood pressure (!) 92/55, pulse (!) 55, temperature 97.7 F (36.5 C), resp. rate 17, height 5\' 7"  (1.702 m), weight 88.5 kg, SpO2 98 %. Gen: no distress, normal appearing HEENT: oral mucosa pink and moist, NCAT Cardio: Bradycardic Chest: normal effort, normal rate of breathing Abd: soft, non-distended Ext: no edema Psych: pleasant, normal affect Skin: splint in place to right lower extremity. Would care dressing to left knee abraison Neuro: Alert x3 and follows commands   Assessment/Plan: 1. Functional deficits which require 3+ hours per day of interdisciplinary therapy in a comprehensive inpatient rehab setting. Physiatrist is providing close team supervision and 24 hour management of active medical problems listed below. Physiatrist and rehab team continue to assess barriers to discharge/monitor patient progress toward functional and medical goals  Care Tool:  Bathing        Body parts bathed by helper: Buttocks     Bathing assist Assist Level: Minimal Assistance - Patient > 75%     Upper Body Dressing/Undressing Upper body dressing   What is the patient wearing?: Hospital gown only    Upper  body assist Assist Level: Minimal Assistance - Patient > 75%    Lower Body Dressing/Undressing Lower body dressing            Lower body assist       Toileting Toileting    Toileting assist       Transfers Chair/bed transfer  Transfers assist     Chair/bed transfer assist level: Minimal Assistance - Patient > 75%     Locomotion Ambulation   Ambulation assist   Ambulation activity did not occur: Safety/medical concerns (low BP, decreased balance, pain)          Walk 10 feet activity   Assist  Walk 10 feet activity did not occur: Safety/medical concerns (low BP, decreased balance, pain)        Walk 50 feet activity   Assist Walk 50 feet with 2 turns activity did not occur: Safety/medical concerns (low BP, decreased balance, pain)         Walk 150 feet activity   Assist Walk 150 feet activity did not occur: Safety/medical concerns (low BP, decreased balance, pain)         Walk 10 feet on uneven surface  activity  Assist Walk 10 feet on uneven surfaces activity did not occur: Safety/medical concerns (low BP, decreased balance, pain)         Wheelchair     Assist Is the patient using a wheelchair?: Yes Type of Wheelchair: Manual Wheelchair activity did not occur: Safety/medical concerns (low BP)         Wheelchair 50 feet with 2 turns activity    Assist    Wheelchair 50 feet with 2 turns activity did not occur: Safety/medical concerns (low BP)       Wheelchair 150 feet activity     Assist  Wheelchair 150 feet activity did not occur: Safety/medical concerns (low BP)       Blood pressure (!) 92/55, pulse (!) 55, temperature 97.7 F (36.5 C), resp. rate 17, height 5\' 7"  (1.702 m), weight 88.5 kg, SpO2 98 %.  Medical Problem List and Plan: 1. Functional deficits secondary to closed right trimalleolar ankle fracture dislocation status post ORIF and closed left scapular body fracture nonoperative with  conservative care.  Nonweightbearing right lower extremity x8 weeks with splint x2 weeks then convert to CAM for range of motion exercises.  Weightbearing as tolerated left upper extremity with sling for comfort.             -patient may not shower             -ELOS/Goals: 10-14 days modI             Initial CIR evaluations today 2.  Antithrombotics: -DVT/anticoagulation:  Pharmaceutical: Lovenox.  Check vascular study.  Convert to Xarelto 10 mg daily x30 days at discharge.             -antiplatelet therapy: N/A 3. Pain: continue Lidoderm patch, Robaxin 1000 mg 4 times daily, oxycodone as needed 4. Mood: Provide emotional support             -antipsychotic agents: N/A 5. Neuropsych: This patient is capable of making decisions on her own behalf. 6. Skin/Wound Care: Routine skin checks 7. Fluids/Electrolytes/Nutrition: Routine in and outs with follow-up chemistries 8.  T11/12 endplate fracture.  Lumbar corset as directed per Dr. Kathyrn Sheriff 9.  Left rib fractures 2 and 6.  Conservative care 10.  Urinary retention.  Continue Urecholine 25 mg 3 times daily.  Check PVR 11.  Atrial fibrillation with RVR.  Follow-up cardiology services.  Flecainide 100 mg every 12 hours, Lopressor 37.5 mg twice daily. Start magnesium gluconate 250mg  HS.  12.  Constipation.  MiraLAX daily, Colace 100 mg twice daily. Start magnesium gluconate 250mg  HS. Check magnesium level tomorrow morning.  13. Vitamin D deficiency: Level is 17: started ergocalciferol 50,000U once per week every 7 weeks on 5/17.  14. Transaminitis: Tylenol changed to PRN 15. Hypotension: abdominal binder and TEDs ordered for therapy 16. Bradycardia: decrease lopressor back to 37.5mg  BID    LOS: 1 days A FACE TO FACE EVALUATION WAS PERFORMED  Clide Deutscher Jaelle Campanile 01/05/2022, 12:35 PM
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
## 2022-01-05 NOTE — Plan of Care (Signed)
  Problem: RH Balance Goal: LTG: Patient will maintain dynamic sitting balance (OT) Description: LTG:  Patient will maintain dynamic sitting balance with assistance during activities of daily living (OT) Flowsheets (Taken 01/05/2022 1557) LTG: Pt will maintain dynamic sitting balance during ADLs with: Independent Goal: LTG Patient will maintain dynamic standing with ADLs (OT) Description: LTG:  Patient will maintain dynamic standing balance with assist during activities of daily living (OT)  Flowsheets (Taken 01/05/2022 1557) LTG: Pt will maintain dynamic standing balance during ADLs with: Independent with assistive device

## 2022-01-05 NOTE — Progress Notes (Signed)
Inpatient [facility] Individual Statement of Services  Patient Name:  Terri Drake  Date:  01/05/2022  Welcome to the [facility].  Our goal is to provide you with an individualized program based on your diagnosis and situation, designed to meet your specific needs.  With this comprehensive rehabilitation program, you will be expected to participate in at least 3 hours of rehabilitation therapies Monday-Friday, with modified therapy programming on the weekends.  Your rehabilitation program will include the following services:  Physical Therapy (PT), Occupational Therapy (OT), Speech Therapy (ST), 24 hour per day rehabilitation nursing, Therapeutic Recreaction (TR), Neuropsychology, Care Coordinator, Rehabilitation Medicine, Nutrition Services, and Pharmacy Services  Weekly team conferences will be held on Wednesdays to discuss your progress.  Your Inpatient Rehabilitation Care Coordinator will talk with you frequently to get your input and to update you on team discussions.  Team conferences with you and your family in attendance may also be held.  Expected length of stay:  12-14 Days  Overall anticipated outcome:  Min A   Depending on your progress and recovery, your program may change. Your Inpatient Rehabilitation Care Coordinator will coordinate services and will keep you informed of any changes. Your Inpatient Rehabilitation Care Coordinator's name and contact numbers are listed  below.  The following services may also be recommended but are not provided by the [facility]:   [place] will be made to provide these services after discharge if needed.  Arrangements include referral to agencies that provide these services.  Your insurance has been verified to be:   Worker's Comp  Your primary doctor is:  Rankins, [place], MD  Pertinent information will be  shared with your doctor and your insurance company.  Inpatient Rehabilitation Care Coordinator:  Erlene Quan, [place] or [phone]  Information discussed with and copy given to patient by: Dyanne Iha, 01/05/2022, 11:05 AM

## 2022-01-05 NOTE — Evaluation (Signed)
Physical Therapy Assessment and Plan  Patient Details  Name: Terri Drake MRN: [id number] Date of Birth: 08-07-1961  PT Diagnosis: Abnormality of gait, Difficulty walking, Dizziness and giddiness, Edema, Muscle weakness, and Pain in LUE Rehab Potential: Good ELOS: 10-14 days   Today's Date: 01/05/2022 PT Individual Time: 7622-6333 PT Individual Time Calculation (min): 71 min    Hospital Problem: Principal Problem:   Trimalleolar fracture   Past Medical History:  Past Medical History:  Diagnosis Date   Ankle dislocation, right, initial encounter 12/30/2021   Ankle syndesmosis disruption, right, initial encounter 12/30/2021   Closed displaced trimalleolar fracture of right ankle 12/30/2021   Dysrhythmia    a-fib   Paroxysmal atrial fibrillation [facility])    Past Surgical History:  Past Surgical History:  Procedure Laterality Date   ORIF ANKLE FRACTURE Right 12/29/2021   Procedure: OPEN REDUCTION INTERNAL FIXATION (ORIF) ANKLE FRACTURE;  Surgeon: Altamese [place], MD;  Location: [facility];  Service: Orthopedics;  Laterality: Right;    Assessment & Plan Clinical Impression: Patient is a 61 y.o. year old female with history of hypertension, occasional alcohol use, atrial fibrillation maintained on Tambocor as well as Toprol.  Per chart review patient lives with spouse.  1 level home 3 steps to entry.  Independent prior to admission.  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  Admission chemistries unremarkable, alcohol negative.  Cranial CT scan as well as CT cervical spine negative.  CT thoracic spine with possible acute mild superior endplate fractures of L45 and T12.  CT of chest abdomen pelvis showed no evidence of acute solid organ injury.  There was an acute displaced left inferior scapular fracture as well as acute nondisplaced left second rib fracture as well as sixth  rib fracture with small amount of soft tissue gas between the first and second ribs on the left side.  Mild left apical pleural thickening suspect secondary small hematoma from adjacent rib trauma.  Trace left apical pneumothorax.  Left shoulder film follow-up identifies comminuted left scapular body fracture with no additional fracture left shoulder.  CT of the right ankle identified displaced and comminuted trimalleolar fracture as well as slight decreased density of the lateral talar dome suspicious for impaction injury.  Underwent open reduction internal fixation of right trimalleolar ankle fracture without fixation of posterior lip/ORIF of the syndemosis 12/29/2021 per Dr. Marcelino Scot.  Nonweightbearing right lower extremity x8 weeks with splint x2 weeks then convert to CAM for range of motion exercises..  In regards to patient's left scapular body fracture weightbearing as tolerated no surgical intervention and sling for comfort.  In regards to patient's T11/12 endplate fracture discussed with Dr. Kathyrn Sheriff lumbar corset as directed.  Currently maintained on Lovenox for DVT prophylaxis converting to Xarelto 10 mg daily at discharge x30 days.  Hospital course cardiology service did follow-up in regards to patient's history of atrial fibrillation with intermittent RVR and currently remains on flecainide as well as Lopressor 37.5 mg twice daily.  Echocardiogram was completed that showed ejection fraction of 65 to 70% no regional wall motion abnormalities.  Bouts of urinary retention Foley tube had been placed maintained on Urecholine.  Therapy evaluations completed due to patient decreased functional mobility was admitted for a comprehensive rehab program. Currently pain limited.  Patient currently requires min with mobility secondary to muscle weakness, decreased cardiorespiratoy endurance, and decreased standing balance, decreased postural control,  decreased balance strategies, and difficulty maintaining  precautions.  Prior to hospitalization, patient was independent  with mobility and lived with Spouse in a House home.  Home access is 3 + 1 thresholdStairs to enter.  Patient will benefit from skilled PT intervention to maximize safe functional mobility, minimize fall risk, and decrease caregiver burden for planned discharge home with 24 hour supervision.  Anticipate patient will benefit from follow up [place] at discharge.  PT - End of Session Activity Tolerance: Tolerates 30+ min activity with multiple rests Endurance Deficit: Yes Endurance Deficit Description: pt required rest break after transfers PT Assessment Rehab Potential (ACUTE/IP ONLY): Good PT Barriers to Discharge: [place] home environment;Home environment access/layout;Wound Care;Weight bearing restrictions;Other (comments) PT Barriers to Discharge Comments: low BP, pain, 3 STE with no rails, altered balance strategies due to NWB precautions PT Patient demonstrates impairments in the following area(s): Balance;Edema;Endurance;Motor;Pain;Skin Integrity PT Transfers Functional Problem(s): Bed Mobility;Bed to Chair;Car;Furniture PT Locomotion Functional Problem(s): Ambulation;Wheelchair Mobility;Stairs PT Plan PT Intensity: Minimum of 1-2 x/day ,45 to 90 minutes PT Frequency: 5 out of 7 days PT Duration Estimated Length of Stay: 10-14 days PT Treatment/Interventions: Ambulation/gait training;Discharge planning;Functional mobility training;Psychosocial support;Therapeutic Activities;Visual/perceptual remediation/compensation;Balance/vestibular training;Disease management/prevention;Neuromuscular re-education;Skin care/wound management;Therapeutic Exercise;Wheelchair propulsion/positioning;DME/adaptive equipment instruction;Pain management;Splinting/orthotics;UE/LE Strength taining/ROM;Community reintegration;Patient/family education;Stair training;UE/LE Coordination activities PT Transfers Anticipated Outcome(s): mod I with LRAD PT  Locomotion Anticipated Outcome(s): supervision with LRAD PT Recommendation Follow Up Recommendations: Home health PT Patient destination: Home Equipment Recommended: To be determined Equipment Details: has [facility]   PT Evaluation Precautions/Restrictions Precautions Precautions: Back;Fall Precaution Comments: orthostatic hypotension 5/15 Required Braces or Orthoses: Sling;Other Brace Other Brace: LSO for OOB, sling for comfort (allowed to move LUE) Restrictions Weight Bearing Restrictions: Yes LUE Weight Bearing: Weight bearing as tolerated RLE Weight Bearing: Non weight bearing Pain Interference Pain Interference Pain Effect on Sleep: 4. Almost constantly Pain Interference with Therapy Activities: 0. Does not apply - I have not received rehabilitationtherapy in the past 5 days Pain Interference with Day-to-Day Activities: 1. Rarely or not at all Home Living/Prior [place]: Spouse/significant other Available Help at Discharge: Family;Available 24 hours/day Type of Home: House Home Access: Stairs to enter [employer] of Steps: 3 + 1 threshold Entrance Stairs-Rails: None Home Layout: Two level;Able to live on main level with bedroom/bathroom Alternate Level Stairs-Number of Steps: 13 steps with L handrail to get to her office Alternate Level Stairs-Rails: Left Bathroom Shower/Tub: Multimedia programmer: Standard Bathroom Accessibility: Yes Additional Comments: has [place]  Lives With: Spouse Prior Function Level of Independence: Independent with basic ADLs;Independent with transfers;Independent with homemaking with ambulation;Independent with gait  Able to Take Stairs?: Yes Driving: Yes Vocation: Full time employment Vocation Requirements: hiking, gardening, walking reguarly Vision/Perception  Vision - History Ability to See in Adequate Light: 0 Adequate Perception Perception: Within Functional Limits Praxis Praxis:  Intact  Cognition Overall Cognitive Status: Within Functional Limits for tasks assessed Arousal/Alertness: Awake/alert Orientation Level: Oriented X4 Memory: Appears intact Awareness: Appears intact Problem Solving: Appears intact Safety/Judgment: Appears intact Comments: pt very particular on how things are done Sensation Sensation Light Touch: Impaired by gross assessment (pinky and ring finger tingle) Hot/Cold: Appears Intact Proprioception: Appears Intact Stereognosis: Not tested Additional Comments: pt reports numbness along ulnar distribution of LUE Coordination Gross Motor Movements are Fluid and Coordinated: No Fine Motor Movements are Fluid and Coordinated: No Coordination and Movement Description: grossly uncoordinated due to low BP, WB restrictions, decreased balance, pain, and generalized weakness/deconditoning. Finger Nose Finger Test: [facility] on  RUE, slow and painful on LUE Heel Shin Test: slightly slower on RLE Motor  Motor Motor: Abnormal postural alignment and control Motor - Skilled Clinical Observations: grossly uncoordinated due to low BP, WB restrictions, decreased balance, pain, and generalized weakness/deconditoning.  Trunk/Postural Assessment  Cervical Assessment Cervical Assessment: Within Functional Limits Thoracic Assessment Thoracic Assessment: Exceptions to [facility] (limited due to fractures) Lumbar Assessment Lumbar Assessment: Exceptions to [facility] (limited due to LSO) Postural Control Postural Control: Deficits on evaluation Protective Responses: delayed due to WB restrictions and low BP causing dizziness  Balance Balance Balance Assessed: Yes Static Sitting Balance Static Sitting - Balance Support: Feet supported;No upper extremity supported Static Sitting - Level of Assistance: 6: Modified independent (Device/Increase time) Dynamic Sitting Balance Dynamic Sitting - Balance Support: Feet supported;No upper extremity supported Dynamic Sitting - Level of  Assistance: 5: Stand by assistance (supervision) Static Standing Balance Static Standing - Balance Support: Bilateral upper extremity supported;During functional activity (L PFRW) Static Standing - Level of Assistance: 5: Stand by assistance (CGA) Dynamic Standing Balance Dynamic Standing - Balance Support: Bilateral upper extremity supported;During functional activity (L PFRW) Dynamic Standing - Level of Assistance: 4: Min assist Dynamic Standing - Comments: with transfers Extremity Assessment  RLE Assessment RLE Assessment: Not tested General Strength Comments: grossly 3/5 LLE Assessment LLE Assessment: Not tested General Strength Comments: grossly 4-/5  Care Tool Care Tool Bed Mobility Roll left and right activity   Roll left and right assist level: Supervision/Verbal cueing    Sit to lying activity        Lying to sitting on side of bed activity   Lying to sitting on side of bed assist level: the ability to move from lying on the back to sitting on the side of the bed with no back support.: Supervision/Verbal cueing     Care Tool Transfers Sit to stand transfer   Sit to stand assist level: Minimal Assistance - Patient > 75%    Chair/bed transfer   Chair/bed transfer assist level: Minimal Assistance - Patient > 75%     Psychologist, counselling transfer activity did not occur: Safety/medical concerns (low BP, decreased balance, pain)        Care Tool Locomotion Ambulation Ambulation activity did not occur: Safety/medical concerns (low BP, decreased balance, pain)        Walk 10 feet activity Walk 10 feet activity did not occur: Safety/medical concerns (low BP, decreased balance, pain)       Walk 50 feet with 2 turns activity Walk 50 feet with 2 turns activity did not occur: Safety/medical concerns (low BP, decreased balance, pain)      Walk 150 feet activity Walk 150 feet activity did not occur: Safety/medical concerns (low BP, decreased balance,  pain)      Walk 10 feet on uneven surfaces activity Walk 10 feet on uneven surfaces activity did not occur: Safety/medical concerns (low BP, decreased balance, pain)      Stairs Stair activity did not occur: Safety/medical concerns (low BP, decreased balance, pain)        Walk up/down 1 step activity Walk up/down 1 step or curb (drop down) activity did not occur: Safety/medical concerns (low BP, decreased balance, pain)      Walk up/down 4 steps activity Walk up/down 4 steps activity did not occur: Safety/medical concerns (low BP, decreased balance, pain)      Walk up/down 12 steps activity Walk up/down 12 steps activity did not  occur: Safety/medical concerns (low BP, decreased balance, pain)      Pick up small objects from floor Pick up small object from the floor (from standing position) activity did not occur: Safety/medical concerns (low BP, decreased balance, pain)      Wheelchair Is the patient using a wheelchair?: Yes Type of Wheelchair: Manual Wheelchair activity did not occur: Safety/medical concerns (low BP)      Wheel 50 feet with 2 turns activity Wheelchair 50 feet with 2 turns activity did not occur: Safety/medical concerns (low BP)    Wheel 150 feet activity Wheelchair 150 feet activity did not occur: Safety/medical concerns (low BP)      Refer to Care Plan for [place] 1 PT Short Term Goal 1 (Week 1): pt will transfer bed<>chair with LRAD and supervision PT Short Term Goal 2 (Week 1): pt will ambulate 46f with LRAD and min A PT Short Term Goal 3 (Week 1): pt will perform simulated car transfer with LRAD and CGA  Recommendations for other services: None   Skilled Therapeutic Intervention Evaluation completed (see details above and below) with education on PT POC and goals and individual treatment initiated with focus on functional mobility/transfers, generalized strengthening and endurance, dynamic standing balance/coordination,  and adherence to RLE NWB precautions. Received pt semi-reclined in bed with RN present at bedside, pt educated on PT evaluation, CIR policies, and therapy schedule and agreeable. Per RN, BP has been low due to HR medications. Pt reported pain 7/10 in LUE (premedicated) - repositioning, rest breaks, and activity modification done to reduce pain levels. MD arrived for morning rounds. Provided pt with 18x18 manual WC, R elevating legrest and L PFRW. Pt transferred supine<>sitting EOB from flat bed with supervision and donned second gown with min A, LSO with max A, and L sock/shoe with max A. Pt transferred bed<>WC stand<>pivot with L PFRW and min A - demonstrating good adherence to RLE NWB precautions. Re-checked BP once in WC: 63/28 on trial 1 and 68/35 on trial 2. NT and RN alerted immediately and pt reported feeling slightly dizzy, keeping eyes closed. Pt requested to transfer into recliner to recline back and elevate feet, ultimately refusing to go back to bed despite encouragement. Pt transferred WC<>recliner stand<>pivot with L PFRW and min A and re-checked BP: 80/45, with improvements in symptoms. RN present at bedside and attending to care - MD notified and safety plan updated.   Orthostatics: Semi-reclined: 143/67 - pt asymptomatic Sitting EOB: 98/59- pt asymptomatic   Mobility Bed Mobility Bed Mobility: Rolling Left;Supine to Sit Rolling Left: Supervision/Verbal cueing Supine to Sit: Supervision/Verbal cueing Transfers Transfers: Sit to Stand;Stand to Sit;Stand Pivot Transfers Sit to Stand: Minimal Assistance - Patient > 75% Stand to Sit: Minimal Assistance - Patient > 75% Stand Pivot Transfers: Minimal Assistance - Patient > 75% Stand Pivot Transfer Details: Verbal cues for safe use of DME/AE Stand Pivot Transfer Details (indicate cue type and reason): verbal cues for RW safety Transfer (Assistive device): Left platform walker Locomotion  Gait Ambulation: No Gait Gait: No Stairs /  Additional Locomotion Stairs: No Wheelchair Mobility Wheelchair Mobility: No   Discharge Criteria: Patient will be discharged from PT if patient refuses treatment 3 consecutive times without medical reason, if treatment goals not met, if there is a change in medical status, if patient makes no progress towards goals or if patient is discharged from hospital.  The above assessment, treatment plan, treatment alternatives and goals were discussed and mutually  agreed upon: by patient  Alfonse Alpers PT, DPT  01/05/2022, 12:16 PM

## 2022-01-05 NOTE — Progress Notes (Signed)
Inpatient Rehabilitation  Patient information reviewed and entered into eRehab system by Lelend Heinecke Riely Oetken, OTR/L.   Information including medical coding, functional ability and quality indicators will be reviewed and updated through discharge.    

## 2022-01-05 NOTE — Progress Notes (Signed)
Physical Therapy Session Note  Patient Details  Name: Terri Drake MRN: [id number] Date of Birth: July 29, 1961  Today's Date: 01/05/2022 PT Individual Time: 1300-1356 PT Individual Time Calculation (min): 56 min   Short Term Goals: Week 1:  PT Short Term Goal 1 (Week 1): pt will transfer bed<>chair with LRAD and supervision PT Short Term Goal 2 (Week 1): pt will ambulate 24ft with LRAD and min A PT Short Term Goal 3 (Week 1): pt will perform simulated car transfer with LRAD and CGA  Skilled Therapeutic Interventions/Progress Updates:   Received pt sitting in recliner with L ted hose and abdominal binder donned. Pt agreeable to PT treatment and denied any pain at rest. Session with emphasis on functional mobility/transfers, toileting, generalized strengthening and endurance, and dynamic standing balance/coordination. BP sitting in recliner: 107/65. Pt reported urge to void and transferred recliner<>bedside commode with L PFRW and min A and able to doff pants standing without UE support and CGA/min A. Pt continent of bladder and performed peri-care with supervision. Stood from commode with min A with 1 L posterior LOB when pulling up pants, requiring mod A to correct. BP sitting in WC after commode transfer:97/51 with mild dizziness. Took seated rest break, then assessed BP in standing:103/57. Pt performed WC mobility 175ft x 2 trials using BUE and LLE to/from dayroom with supervision. Pt performed seated LLE strengthening on Kinetron at 20 cm/sec increasing to 10 cm/sec for 1 minute x 4 trials with therapist providing manual counter resistance with emphasis on glute/quad strength. Assessed BP after each minute on Kinetron (94/42, 95/45, 95/47, and 94/45) - pt denied any dizziness but reported "not feeling herself". Pt then performed the following exercises with supervision and verbal cues for technique with emphasis on UE strength/ROM: -bicep curls on RUE with 4lb dumbbell 2x12 and unweighted on LUE  2x10 -horizontal chest press with 1lb dowel 2x8 (decreased ROM on LUE) BP: 85/43 after exercising. Concluded session with pt sitting in WC, needs within reach, and chair pad alarm on. RLE elevated on chair for comfort.   Therapy Documentation Precautions:  Precautions Precautions: Fall, Back Required Braces or Orthoses: Sling, Other Brace Other Brace: LSO for OOB, sling for comfort (allowed to move LUE) Restrictions Weight Bearing Restrictions: Yes LUE Weight Bearing: Weight bearing as tolerated RLE Weight Bearing: Non weight bearing  Therapy/Group: Individual Therapy Martin Majestic PT, DPT  01/05/2022, 7:05 AM

## 2022-01-05 NOTE — Progress Notes (Signed)
Orthopedic Tech Progress Note Patient Details:  Terri Drake [birth year] [id number]  Ortho Devices Type of Ortho Device: Abdominal binder      [name] [name] 01/05/2022, 12:00 PM

## 2022-01-06 ENCOUNTER — Inpatient Hospital Stay (HOSPITAL_COMMUNITY): Payer: No Typology Code available for payment source

## 2022-01-06 ENCOUNTER — Encounter (HOSPITAL_COMMUNITY): Payer: Self-pay | Admitting: Physical Medicine and Rehabilitation

## 2022-01-06 DIAGNOSIS — I4891 Unspecified atrial fibrillation: Secondary | ICD-10-CM

## 2022-01-06 DIAGNOSIS — M7989 Other specified soft tissue disorders: Secondary | ICD-10-CM

## 2022-01-06 MED ORDER — BETHANECHOL CHLORIDE 10 MG PO TABS
5.0000 mg | ORAL_TABLET | Freq: Three times a day (TID) | ORAL | Status: DC
Start: 2022-01-06 — End: 2022-01-07
  Administered 2022-01-06 – 2022-01-07 (×4): 5 mg via ORAL
  Filled 2022-01-06 (×4): qty 1

## 2022-01-06 MED ORDER — METOPROLOL SUCCINATE ER 25 MG PO TB24
25.0000 mg | ORAL_TABLET | Freq: Every day | ORAL | Status: DC
  Administered 2022-01-06 – 2022-01-10 (×5): 25 mg via ORAL
  Filled 2022-01-06 (×5): qty 1

## 2022-01-06 MED ORDER — METOPROLOL TARTRATE 12.5 MG HALF TABLET
12.5000 mg | ORAL_TABLET | Freq: Four times a day (QID) | ORAL | Status: DC | PRN
Start: 2022-01-06 — End: 2022-01-13

## 2022-01-06 MED ORDER — MAGNESIUM HYDROXIDE 400 MG/5ML PO SUSP
30.0000 mL | Freq: Every day | ORAL | Status: DC | PRN
  Administered 2022-01-07: 30 mL via ORAL
  Filled 2022-01-06: qty 30

## 2022-01-06 NOTE — Progress Notes (Signed)
Physical Therapy Session Note  Patient Details  Name: Terri Drake MRN: [id number] Date of Birth: 1960/09/09  Today's Date: 01/06/2022 PT Individual Time: 0800-0855 PT Individual Time Calculation (min): 55 min   Short Term Goals: Week 1:  PT Short Term Goal 1 (Week 1): pt will transfer bed<>chair with LRAD and supervision PT Short Term Goal 2 (Week 1): pt will ambulate 46ft with LRAD and min A PT Short Term Goal 3 (Week 1): pt will perform simulated car transfer with LRAD and CGA  Skilled Therapeutic Interventions/Progress Updates:   Received pt semi-reclined in bed with RN present at bedside. Pt agreeable to PT treatment and reported pain 2-3/10 in L shoulder (premedicated). Session with emphasis on functional mobility/transfers, BP management, dynamic standing balance/coordination, gait training, and toileting. Pt transferred semi-reclined<>sitting EOB with HOB elevated and supervision. Pt with concerns regarding new brusing on L tricep/bicep and L scapula - MD notified. Donned LSO with max A and L shoe with supervision. Sit<>stand<>pivot bed<>WC with L PFRW and min A - demonstrating good adherence to RLE NWB  precautions. Placed towels and co-band on R elevating legrest for comfort and pt performed WC mobility 170ft x 2 trials using BUE and LLE with supervision to/from dayroom. Sit<>stand with L PFRW and min A and performed 2x8 L heel raises to warm up then "hopped" x 71ft and 60ft x 1 with L PFRW and min A with close WC follow - pt demonstrating good adherence to RLE NWB precautions but decreased R foot clearance with fatigue - also limited by increased pain in LUE. Pt reported urge to void and transferred WC<>bedside commode with L PFRW and min A. Pt required max A for clothing management on L side and with generalized unsteadiness, requiring mod A due to multiple LOB. Pt able to void and performed peri-care with supervision. Pt left in care of NT due to time restrictions.   Orthostatics  (without ted hose or abdominal binder): Supine: 138/68  Sitting EOB:132/59 Standing: 112/80 After ambulation: 96/54 - reporting dizziness; improved to 104/69 with 3 minute rest. 2nd trial ambulating:102/55  Therapy Documentation Precautions:  Precautions Precautions: Back, Fall Precaution Comments: orthostatic hypotension 5/15 Required Braces or Orthoses: Sling, Other Brace Other Brace: LSO for OOB, sling for comfort (allowed to move LUE) Restrictions Weight Bearing Restrictions: Yes LUE Weight Bearing: Weight bearing as tolerated RLE Weight Bearing: Non weight bearing  Therapy/Group: Individual Therapy Martin Majestic PT, DPT  01/06/2022, 7:12 AM

## 2022-01-06 NOTE — Consult Note (Addendum)
Cardiology Consultation:   Patient ID: Terri Drake MRN: [id number]; DOB: [birth year]  Admit date: 01/04/2022 Date of Consult: 01/06/2022  PCP:  Aretta Nip, MD   [place] Prov[facility] Cardiologist:  Freada Bergeron, MD  Cardiology APP:  [place], [place]       Patient Profile:   Terri Drake is a 61 y.o. female with a hx of PAF on flecainide and Toprol XL, had side effects w/ Dilt, CHA2DS2-VASc = 1 so not anticoagulated as of office note 10/2021, ?HTN, who is being seen 01/06/2022 for the evaluation of Afib at the request of Dr Ranell Patrick.  History of Present Illness:   Ms. Terri Drake was seen in the office 11/04/2021 and was doing well in [place]. She wanted to be off Eliquis >> was taken off since CHA2DS2-VASc was 1 (female). Continue Flecainide and BB.   Pt admitted 12/27/21 after being struck by a very large tree branch sustaining T11/12 endplate frx - NSGY c/s, Dr. Kathyrn Sheriff (wear a lumbar corset),  R ankle fx - Ortho c/s, closed reduction in ER  she then had ORIF or Rt ankle. -Dr. Stann Mainland, CT ankle for operative planning, NWB, L rib fx 2-6, L PTX,  L scapula fx, Abrasions to L shoulder and L knee.  She was in/out of Afib after 05/13, Cards saw 05/15. Toprol XL increased to 25 mg, but HR/BP dropped. This was evaluated and felt to be from pain meds.  Flecainide was increased to 100 mg bid, continue BB. She continued to have runs of rapid Afib >> BB increased to  Lopressor 37.5 mg bid >> 50 mg bid.   Admitted to CIR 05/17.  Mg gluconate 250 mg qhs started. BP/HR did not tolerate metop 50 mg bid >> 37.5 mg bid. Started on Lovenox, LE vascular studies ordered, if ok >> Xarelto 10 mg qd x 30 days at d/c.   Cards consulted to see if BB could be changed to Toprol XL qhs to minimize hypotension during therapy.   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  She is making good progress.  She is not aware of the atrial fibrillation.  She is aware of the episodes that she had while she was in the regular hospital because she was on telemetry at that time.    However, she does not have palpitations and is not aware when she is in A-fib, except perhaps for feeling tired.  On 5/17, the day she transferred from the regular hospital to [place], she had some heart rates 100-110.  Other than that, her heart rate has been well controlled in the 50s-70s.  The real problem is that she has had significant hypotension.  Her blood pressure has been as low as 67/28.  She was on metoprolol 50 mg twice daily at transfer.  This was cut down to metoprolol 37.5 mg twice daily on 5/18 because of hypotension.  The plan was to give the beta-blocker after her therapy session.  However, her blood pressure dropped again and the beta-blocker was held this a.m.  She notes that she had tolerated Toprol-XL 25 mg nightly for over a year.  She felt like she tolerated this dose very well.  The dose was decreased in the hospital because of hypotension but she had multiple episodes of atrial fibrillation.  The beta-blocker was uptitrated because of them.  Her blood pressure is less than 100, she feels back.  She is also not able to participate in rehab therapies is much as she would like.   Past Medical History:  Diagnosis Date   Ankle dislocation, right, initial encounter 12/30/2021   Ankle syndesmosis disruption, right, initial encounter 12/30/2021   Closed displaced trimalleolar fracture of right ankle 12/30/2021   Dysrhythmia    a-fib   Paroxysmal atrial fibrillation [facility])     Past Surgical History:  Procedure Laterality Date   ORIF ANKLE FRACTURE Right 12/29/2021   Procedure: OPEN REDUCTION INTERNAL FIXATION (ORIF) ANKLE FRACTURE;  Surgeon: Altamese [place], MD;  Location: [place];  Service: Orthopedics;  Laterality: Right;     Home Medications:  Prior to Admission  medications   Medication Sig Start Date End Date Taking? Authorizing Provider  flecainide (TAMBOCOR) 50 MG tablet TAKE 1 TABLET(50 MG) BY MOUTH TWICE DAILY Patient taking differently: Take 50 mg by mouth 2 (two) times daily. TAKE 1 TABLET(50 MG) BY MOUTH TWICE DAILY 11/04/21   Fenton, Clint R, PA  fluticasone (CUTIVATE) 0.05 % cream Apply 1 application. topically 2 (two) times daily as needed. 08/29/21   [provider]  latanoprost (XALATAN) 0.005 % ophthalmic solution Place 1 drop into both eyes at bedtime. 01/07/21   [provider]  metoprolol succinate (TOPROL XL) 25 MG 24 hr tablet Take 0.5 tablets (12.5 mg total) by mouth daily. 11/04/21   Fenton, Clint R, PA    Inpatient Medications: Scheduled Meds:  vitamin C  500 mg Oral Daily   bacitracin   Topical BID   bethanechol  5 mg Oral TID   docusate sodium  100 mg Oral BID   enoxaparin (LOVENOX) injection  30 mg Subcutaneous Q12H   flecainide  100 mg Oral Q12H   latanoprost  1 drop Both Eyes QHS   lidocaine  1 patch Transdermal Q24H   magnesium gluconate  250 mg Oral QHS   metoprolol tartrate  37.5 mg Oral BID   Vitamin D (Ergocalciferol)  50,000 Units Oral Q7 days   zinc sulfate  220 mg Oral Daily   Continuous Infusions:  PRN Meds: acetaminophen, magnesium hydroxide, methocarbamol, oxyCODONE  Allergies:    Allergies  Allergen Reactions   Elemental Sulfur     REACTION: hives   Gluten Meal Diarrhea   Sulfa Antibiotics Rash    Social History:   Social History   Socioeconomic History   Marital status: Married    Spouse name: Not on file   Number of children: Not on file   Years of education: Not on file   Highest education level: Not on file  Occupational History   Not on file  Tobacco Use   Smoking status: Never   Smokeless tobacco: Never   Tobacco comments:    Never smoke 11/04/21  Vaping Use   Vaping Use: Never used  Substance and Sexual Activity   Alcohol use: Yes    Alcohol/week: 5.0 - 10.0  standard drinks    Types: 5 - 10 Glasses of wine per week    Comment: 1-2 glasses nightly 08/05/2021   Drug use: Never   Sexual activity: Not on file  Other Topics Concern   Not on file  Social History Narrative   Not on file   Social Determinants of Health   Financial Resource Strain: Not on file  Food Insecurity: Not on file  Transportation Needs: Not on file  Physical Activity: Not on file  Stress: Not on  file  Social Connections: Not on file  Intimate Partner Violence: Not on file    Family History:   Family History  Problem Relation Age of Onset   Heart disease Neg Hx        in either parent     ROS:  Please see the history of present illness.   All other ROS reviewed and negative.     Physical Exam/Data:   Vitals:   01/05/22 2303 01/06/22 0351 01/06/22 0638 01/06/22 1313  BP: 122/70 110/63 128/71 (!) 116/46  Pulse: (!) 52 (!) 57 62 73  Resp: 18 16 14 17   Temp: 98.2 F (36.8 C) 98.4 F (36.9 C)  98.3 F (36.8 C)  TempSrc:      SpO2: 99% 96% 96% 100%  Weight:      Height:        Intake/Output Summary (Last 24 hours) at 01/06/2022 1424 Last data filed at 01/05/2022 1849 Gross per 24 hour  Intake 420 ml  Output --  Net 420 ml      01/04/2022    2:01 PM 12/27/2021    5:48 PM 11/04/2021    8:31 AM  Last 3 Weights  Weight (lbs) 195 lb 1.7 oz 191 lb 194 lb 9.6 oz  Weight (kg) 88.5 kg 86.637 kg 88.27 kg     Body mass index is 30.56 kg/m.  General:  Well nourished, well developed, in no acute distress HEENT: normal for age Neck: no JVD Vascular: No carotid bruits; 3/4 Distal pulses 2+ bilaterally, unable to check RLE due to immobilization Cardiac:  normal S1, S2; RRR; no murmur  Lungs:  clear to auscultation bilaterally, no wheezing, rhonchi or rales  Abd: soft, nontender, no hepatomegaly  Ext: no edema Musculoskeletal:  No deformities, BUE and BLE strength not tested due to injuries Skin: warm and dry  Neuro:  CNs 2-12 intact, no focal abnormalities  noted Psych:  Normal affect   EKG:  The EKG was personally reviewed and demonstrates:  05/15 ECG is atrial fib (coarse, ? Flutter), HR 132 Telemetry:  Telemetry was personally reviewed and demonstrates:  n/a  Relevant CV Studies:  ECHO: 01/02/2022  1. Left ventricular ejection fraction, by estimation, is 65 to 70%. The  left ventricle has hyperdynamic function. The left ventricle has no  regional wall motion abnormalities. Left ventricular diastolic parameters  are indeterminate.   2. Right ventricular systolic function is normal. The right ventricular  size is normal. Tricuspid regurgitation signal is inadequate for assessing  PA pressure.   3. The mitral valve is normal in structure. No evidence of mitral valve  regurgitation. No evidence of mitral stenosis.   4. The aortic valve is normal in structure. Aortic valve regurgitation is  not visualized. No aortic stenosis is present.   5. The inferior vena cava is normal in size with greater than 50%  respiratory variability, suggesting right atrial pressure of 3 mmHg.   ETT: 08/11/2021   No ST deviation was noted.   Prior study not available for comparison.   IMPRESSIONS Negative for stress induced arrhythmias or QRS widening. Estimated exercise workload is 10 METs, which confers a survival benefit.   RECOMMENDATIONS/CONCLUSIONS Negative ECG Stress test.  Low risk study.  Echo 02/24/21 demonstrated   1. Left ventricular ejection fraction, by estimation, is 60 to 65%. The  left ventricle has normal function. The left ventricle has no regional  wall motion abnormalities. Left ventricular diastolic parameters were  normal.   2. Right ventricular  systolic function is normal. The right ventricular  size is normal. Tricuspid regurgitation signal is inadequate for assessing PA pressure.   3. The mitral valve is normal in structure. No evidence of mitral valve  regurgitation.   4. The aortic valve is tricuspid. Aortic valve  regurgitation is trivial.  No aortic stenosis is present.   5. The inferior vena cava is normal in size with greater than 50%  respiratory variability, suggesting right atrial pressure of 3 mmHg.   Laboratory Data:  High Sensitivity Troponin:  No results for input(s): TROPONINIHS in the last 720 hours.   Chemistry Recent Labs  Lab 01/02/22 1006 01/04/22 0552 01/05/22 0540  NA 137 137 137  K 4.3 4.2 3.9  CL 108 105 107  CO2 20* 23 23  GLUCOSE 121* 106* 110*  BUN 8 11 15   CREATININE 0.64 0.73 0.78  CALCIUM 9.0 9.1 8.8*  MG  --   --  2.2  GFRNONAA >60 >60 >60  ANIONGAP 9 9 7     Recent Labs  Lab 01/05/22 0540  PROT 5.7*  ALBUMIN 3.0*  AST 59*  ALT 107*  ALKPHOS 94  BILITOT 1.3*   Lipids No results for input(s): CHOL, TRIG, HDL, LABVLDL, LDLCALC, CHOLHDL in the last 168 hours.  Hematology Recent Labs  Lab 01/02/22 1006 01/04/22 0552 01/05/22 0540  WBC 7.6 7.7 8.7  RBC 3.86* 3.59* 3.45*  HGB 13.5 12.4 11.8*  HCT 37.0 34.9* 33.6*  MCV 95.9 97.2 97.4  MCH 35.0* 34.5* 34.2*  MCHC 36.5* 35.5 35.1  RDW 12.8 13.1 13.3  PLT 262 300 304   Thyroid No results for input(s): TSH, FREET4 in the last 168 hours.  BNPNo results for input(s): BNP, PROBNP in the last 168 hours.  DDimer No results for input(s): DDIMER in the last 168 hours.   Radiology/Studies:  ECHOCARDIOGRAM COMPLETE  Result Date: 01/02/2022    ECHOCARDIOGRAM REPORT   Patient Name:   ALILYANA TOLMAN Date of Exam: 01/02/2022 Medical Rec #:  [id number]         Height:       67.0 in Accession #:    [id number]        Weight:       191.0 lb Date of Birth:  07-May-1961          BSA:          1.983 m Patient Age:    43 years          BP:           157/57 mmHg Patient Gender: F                 HR:           78 bpm. Exam Location:  Inpatient Procedure: 2D Echo STAT ECHO Indications:    chest trauma  History:        Patient has prior history of Echocardiogram examinations, most                 recent 02/24/2021.  Arrythmias:Atrial Fibrillation.  Sonographer:    Johny Chess RDCS Referring Phys: [id number] Jesusita Oka  Sonographer Comments: Restricted mobility due to left side fractures. IMPRESSIONS  1. Left ventricular ejection fraction, by estimation, is 65 to 70%. The left ventricle has hyperdynamic function. The left ventricle has no regional wall motion abnormalities. Left ventricular diastolic parameters are indeterminate.  2. Right ventricular systolic function is normal. The right ventricular size is normal. Tricuspid regurgitation signal  is inadequate for assessing PA pressure.  3. The mitral valve is normal in structure. No evidence of mitral valve regurgitation. No evidence of mitral stenosis.  4. The aortic valve is normal in structure. Aortic valve regurgitation is not visualized. No aortic stenosis is present.  5. The inferior vena cava is normal in size with greater than 50% respiratory variability, suggesting right atrial pressure of 3 mmHg. FINDINGS  Left Ventricle: Left ventricular ejection fraction, by estimation, is 65 to 70%. The left ventricle has hyperdynamic function. The left ventricle has no regional wall motion abnormalities. The left ventricular internal cavity size was normal in size. There is no left ventricular hypertrophy. Left ventricular diastolic function could not be evaluated due to atrial fibrillation. Left ventricular diastolic parameters are indeterminate. Right Ventricle: The right ventricular size is normal. No increase in right ventricular wall thickness. Right ventricular systolic function is normal. Tricuspid regurgitation signal is inadequate for assessing PA pressure. Left Atrium: Left atrial size was normal in size. Right Atrium: Right atrial size was normal in size. Pericardium: There is no evidence of pericardial effusion. Mitral Valve: The mitral valve is normal in structure. No evidence of mitral valve regurgitation. No evidence of mitral valve stenosis. Tricuspid  Valve: The tricuspid valve is normal in structure. Tricuspid valve regurgitation is trivial. No evidence of tricuspid stenosis. Aortic Valve: The aortic valve is normal in structure. Aortic valve regurgitation is not visualized. No aortic stenosis is present. Pulmonic Valve: The pulmonic valve was normal in structure. Pulmonic valve regurgitation is not visualized. No evidence of pulmonic stenosis. Aorta: The aortic root is normal in size and structure. Venous: The inferior vena cava is normal in size with greater than 50% respiratory variability, suggesting right atrial pressure of 3 mmHg. IAS/Shunts: No atrial level shunt detected by color flow Doppler.  LEFT VENTRICLE PLAX 2D LVIDd:         4.20 cm   Diastology LVIDs:         2.40 cm   LV e' medial:   8.55 cm/s LV PW:         1.00 cm   LV E/e' medial: 7.3 LV IVS:        0.90 cm LVOT diam:     2.00 cm LVOT Area:     3.14 cm  RIGHT VENTRICLE RV S prime:     16.20 cm/s LEFT ATRIUM           Index LA diam:      2.30 cm 1.16 cm/m LA Vol (A4C): 34.7 ml 17.50 ml/m   AORTA Ao Root diam: 3.30 cm Ao Asc diam:  2.70 cm MITRAL VALVE MV Area (PHT): 2.77 cm    SHUNTS MV Decel Time: 274 msec    Systemic Diam: 2.00 cm MV E velocity: 62.40 cm/s MV A velocity: 67.60 cm/s MV E/A ratio:  0.92 Kardie Tobb DO Electronically signed by Berniece Salines DO Signature Date/Time: 01/02/2022/3:02:03 PM    Final    VAS [place] LOWER EXTREMITY VENOUS (DVT)  Result Date: 01/06/2022  Lower Venous DVT Study Patient Name:  LARETHA CORNACCHIA  Date of Exam:   01/06/2022 Medical Rec #: [id number]          Accession #:    [id number] Date of Birth: 04-11-1961           Patient Gender: F Patient Age:   17 years Exam Location:  [facility] Procedure:      VAS [place] LOWER EXTREMITY VENOUS (DVT) Referring Phys:  DANIEL ANGIULLI --------------------------------------------------------------------------------  Indications: Swelling, s/p trauma, RT ankle ORIF.  Risk Factors: Immobility. Anticoagulation: Lovenox.  Limitations: Plaster cast right ankle/calf. Comparison Study: No prior studies. Performing Technologist: Darlin Coco RDMS, RVT  Examination Guidelines: A complete evaluation includes B-mode imaging, spectral Doppler, color Doppler, and power Doppler as needed of all accessible portions of each vessel. Bilateral testing is considered an integral part of a complete examination. Limited examinations for reoccurring indications may be performed as noted. The reflux portion of the exam is performed with the patient in reverse Trendelenburg.  +---------+---------------+---------+-----------+----------+-------------------+ RIGHT    CompressibilityPhasicitySpontaneityPropertiesThrombus Aging      +---------+---------------+---------+-----------+----------+-------------------+ CFV      Full           Yes      Yes                                      +---------+---------------+---------+-----------+----------+-------------------+ SFJ      Full                                                             +---------+---------------+---------+-----------+----------+-------------------+ FV Prox  Full                                                             +---------+---------------+---------+-----------+----------+-------------------+ FV Mid   Full                                                             +---------+---------------+---------+-----------+----------+-------------------+ FV DistalFull                                                             +---------+---------------+---------+-----------+----------+-------------------+ PFV      Full                                                             +---------+---------------+---------+-----------+----------+-------------------+ POP      Full           Yes      Yes                                      +---------+---------------+---------+-----------+----------+-------------------+ PTV  Unable to visualize                                                       due to cast         +---------+---------------+---------+-----------+----------+-------------------+ PERO                                                  Unable to visualize                                                       due to cast         +---------+---------------+---------+-----------+----------+-------------------+ Gastroc  Full                                                             +---------+---------------+---------+-----------+----------+-------------------+   +---------+---------------+---------+-----------+----------+--------------+ LEFT     CompressibilityPhasicitySpontaneityPropertiesThrombus Aging +---------+---------------+---------+-----------+----------+--------------+ CFV      Full           Yes      Yes                                 +---------+---------------+---------+-----------+----------+--------------+ SFJ      Full                                                        +---------+---------------+---------+-----------+----------+--------------+ FV Prox  Full                                                        +---------+---------------+---------+-----------+----------+--------------+ FV Mid   Full                                                        +---------+---------------+---------+-----------+----------+--------------+ FV DistalFull                                                        +---------+---------------+---------+-----------+----------+--------------+ PFV      Full                                                        +---------+---------------+---------+-----------+----------+--------------+  POP      Full           Yes      Yes                                 +---------+---------------+---------+-----------+----------+--------------+ PTV      Full                                                         +---------+---------------+---------+-----------+----------+--------------+ PERO     Full                                                        +---------+---------------+---------+-----------+----------+--------------+ Gastroc  Full                                                        +---------+---------------+---------+-----------+----------+--------------+    Summary: RIGHT: - There is no evidence of deep vein thrombosis in the lower extremity. However, portions of this examination were limited- see technologist comments above.  - No cystic structure found in the popliteal fossa.  LEFT: - There is no evidence of deep vein thrombosis in the lower extremity.  - No cystic structure found in the popliteal fossa.  *See table(s) above for measurements and observations.    Preliminary      Assessment and Plan:   PAF -Cardiology saw her during her admission for the acute injuries, she had episodes of atrial fibrillation at that time and her beta-blocker was uptitrated - However, she has not been able to work well with rehab because her blood pressure is too low. -She denies orthostasis and says that she is drinking plenty of water and eating well - However, she says that when she works with rehab, she will initially feel okay but then her blood pressure will drop and she will have to stop - Patient also says that she did well on Toprol-XL 25 mg for over a year - discuss with MD changing her beta-blocker to Toprol-XL 25 mg nightly -Continue to monitor heart rate and blood pressure -She is wearing a compression sock on her left leg and has a binder for her ribs as well this may help maintain her blood pressure -Further evaluation per MD  2.  Trauma related to patient having a very large tree branch fall on her - Her right lower extremity is still immobilized she has had ORIF -She is recovering from the rib fractures and from a scapular  fracture -Her pneumothorax had resolved per chest x-ray 5/15 -She is working with physical therapy to get her strength back. -Per Dr. Carlis Abbott and staff   Risk Assessment/Risk Scores:      CHA2DS2-VASc Score = 1   This indicates a 0.6% annual risk of stroke. The patient's score is based upon: CHF History: 0 HTN History: 0 Diabetes History: 0 Stroke History: 0 Vascular Disease History: 0 Age Score: 0 Gender Score: 1  For questions or updates, please contact [place] Please consult [email or web address] for contact info under    Signed, Rosaria Ferries, PA-C  01/06/2022 2:24 PM  History and all data above reviewed.  Patient examined.  I agree with the findings as above.   The patient has had PAF.  She does not feel this necessarily but she does feel poorly when the BP drops.  Since transfer she does not think that she has been in fib.  The patient exam reveals COR:RRR  ,  Lungs: Clear  ,  Abd: .Positive bowel sounds, no rebound no guarding, Ext No edema  .  All available labs, radiology testing, previous records reviewed. Agree with documented assessment and plan. Atrial fib:  I will change to Toprol XL 25 mg at night and PRN 12.5 with sustained atrial fib with rapid rate.  I had a long discussion with her to reinforce the possible different approaches should she have future problems with fib.  Namely we would have a lower threshold to send for ablation.  To date she has been well controlled on flecainide and low dose beta blocker.    Minus Breeding  5:37 PM  01/06/2022

## 2022-01-06 NOTE — Progress Notes (Signed)
Alert and cooperative, verbalized that she does not need the Lidocaine patch at this time

## 2022-01-06 NOTE — Progress Notes (Signed)
Occupational Therapy Session Note  Patient Details  Name: Terri Drake MRN: [id number] Date of Birth: [birth year]  Today's Date: 01/06/2022 OT Individual Time: [id number] & 1335-1440 OT Individual Time Calculation (min): 73 min & 55 min   Short Term Goals: Week 1:  OT Short Term Goal 1 (Week 1): Patient will stand pivot transfer with platform walker to [facility] with min assist OT Short Term Goal 2 (Week 1): Patient will stand to pull up/down LB clothing with intermittent min assistance OT Short Term Goal 3 (Week 1): Patient will demonstrate 86* of shoulder flexion with LUE without report of increased pain  Skilled Therapeutic Interventions/Progress Updates: Session 1 Skilled OT intervention completed with focus on functional transfers, activity tolerance, home management education. Pt received upright in bed, having just had ultrasound with negative result for DVT. No c/o pain, requesting to use bathroom. Completed bed mobility with supervision, then sit > stand with L PFRW with min A and stand pivot at same assist to [facility]. Good adherence to NWB on RLE. Continent of void only. Supervision for pericare at seated level, then sit > stand with min A using L PFRW and min A stand pivot to EOB. Donned TEDS and abdominal binder with total A per nursing recommendation as they try to regulate needed meds and BP effects.  Pt with questions about her bathroom set up at home, providing pictures for reference, with time spent discussing options for shower transfer, DME options, and potential bathing/dressing options at sink level until WB precautions lifted as pt has small lip to enter into shower with only a corner seat available, with no grab bars and limited room for RW to fit 2/2 placement of toilet. Education provided about shower tray for use at home, with pt requesting to wash hair as such during session. Completed hair washing with total A with use of shower tray, then pt able to towel dry and comb hair with  set up A, demonstrating limitations on LUE 2/2 pain with AROM. Pt was left upright in w/c, with chair pad on, RLE elevated for comfort and all needs in reach at end of session.   BP measures as follows during session: (Without adbominal binder/TEDS as pt was received without) Sitting EOB at initial session- 131/54 After transfer to [facility]- 150/59 After prolonged time in stance during toileting- 140/56  Session 2 Skilled OT intervention completed with focus on AAROM of LUE. Pt received seated in w/c, no c/o pain, agreeable to session. Reported that with NT, BP was lower. Of note- abdominal binder and TEDS donned, but the binder was around the chest area vs abdomen. Educated pt on purpose and mechanism of the binder,  with total A needed to readjust the binder/brace. PA in room to discuss pt's BP trends, with therapist participating in pt's status and interventions completed to assist with BP management. Transported with total A for time management in w/c from room <> gym. Seated at table spt completed the following to promote AAROM/self-ROM progressing to AROM of the LUE needed for ADL tasks and for pain management:  (Table slides) 2x10 on LUE Shoulder flexion Shoulder external/internal Shoulder abduction/adduction Circular motion sweeping R>L then L>R  AAROM with hollow pipe x10 Shoulder flexion (limited to below 90 degrees) Chest presses  Therapist provided elbow stabilization to assist in off-weighting the arm, as well as to promote scapular gliding as pt exhibited most pain/limitations with movement upon abduction and scapular retraction with improvement in pain reported when therapist off-loaded at elbow. Pt  was left seated in w/c, with chair alarm on and all needs in reach at end of session.     Therapy Documentation Precautions:  Precautions Precautions: Back, Fall Precaution Comments: orthostatic hypotension 5/15 Required Braces or Orthoses: Sling, Other Brace Other Brace: LSO for  OOB, sling for comfort (allowed to move LUE) Restrictions Weight Bearing Restrictions: Yes LUE Weight Bearing: Weight bearing as tolerated RLE Weight Bearing: Non weight bearing    Therapy/Group: Individual Therapy  Jaskirat Schwieger E Christan Defranco 01/06/2022, 7:44 AM

## 2022-01-06 NOTE — Progress Notes (Signed)
Lower extremity venous bilateral study completed.  Preliminary results relayed to Ranell Patrick, MD.  See CV Proc for preliminary results report.   Darlin Coco, RDMS, RVT

## 2022-01-06 NOTE — Progress Notes (Signed)
Transported to MRI via bed  2205 Returned back from MRI,repositioned in bed, c/o pain 10/10 to right shoulder,lower back, leg, all over ,repositioned, medicated with prn

## 2022-01-06 NOTE — Progress Notes (Signed)
Lower extremity venous attempted. Patient with MD and in wheelchair. Will attempt again as schedule and patient availability permits.   Jean Rosenthal, RDMS, RVT

## 2022-01-06 NOTE — Progress Notes (Signed)
PROGRESS NOTE   Subjective/Complaints: Complaining of worsening left sided shoulder pain and bruising and was concerned about the bruising Did better with therapy this AM though still felt dizzy with slight drop in BP  ROS: +dizziness with low blood pressure, +left sided scapular pain   Objective:   VAS [place] LOWER EXTREMITY VENOUS (DVT)  Result Date: 01/06/2022  Lower Venous DVT Study Patient Name:  Terri Drake  Date of Exam:   01/06/2022 Medical Rec #: [id number]          Accession #:    [id number] Date of Birth: 08-10-1961           Patient Gender: F Patient Age:   61 years Exam Location:  [facility] Procedure:      VAS [place] LOWER EXTREMITY VENOUS (DVT) Referring Phys: Lauraine Rinne --------------------------------------------------------------------------------  Indications: Swelling, s/p trauma, RT ankle ORIF.  Risk Factors: Immobility. Anticoagulation: Lovenox. Limitations: Plaster cast right ankle/calf. Comparison Study: No prior studies. Performing Technologist: Darlin Coco RDMS, RVT  Examination Guidelines: A complete evaluation includes B-mode imaging, spectral Doppler, color Doppler, and power Doppler as needed of all accessible portions of each vessel. Bilateral testing is considered an integral part of a complete examination. Limited examinations for reoccurring indications may be performed as noted. The reflux portion of the exam is performed with the patient in reverse Trendelenburg.  +---------+---------------+---------+-----------+----------+-------------------+ RIGHT    CompressibilityPhasicitySpontaneityPropertiesThrombus Aging      +---------+---------------+---------+-----------+----------+-------------------+ CFV      Full           Yes      Yes                                      +---------+---------------+---------+-----------+----------+-------------------+ SFJ      Full                                                              +---------+---------------+---------+-----------+----------+-------------------+ FV Prox  Full                                                             +---------+---------------+---------+-----------+----------+-------------------+ FV Mid   Full                                                             +---------+---------------+---------+-----------+----------+-------------------+ FV DistalFull                                                             +---------+---------------+---------+-----------+----------+-------------------+  PFV      Full                                                             +---------+---------------+---------+-----------+----------+-------------------+ POP      Full           Yes      Yes                                      +---------+---------------+---------+-----------+----------+-------------------+ PTV                                                   Unable to visualize                                                       due to cast         +---------+---------------+---------+-----------+----------+-------------------+ PERO                                                  Unable to visualize                                                       due to cast         +---------+---------------+---------+-----------+----------+-------------------+ Gastroc  Full                                                             +---------+---------------+---------+-----------+----------+-------------------+   +---------+---------------+---------+-----------+----------+--------------+ LEFT     CompressibilityPhasicitySpontaneityPropertiesThrombus Aging +---------+---------------+---------+-----------+----------+--------------+ CFV      Full           Yes      Yes                                  +---------+---------------+---------+-----------+----------+--------------+ SFJ      Full                                                        +---------+---------------+---------+-----------+----------+--------------+ FV Prox  Full                                                        +---------+---------------+---------+-----------+----------+--------------+  FV Mid   Full                                                        +---------+---------------+---------+-----------+----------+--------------+ FV DistalFull                                                        +---------+---------------+---------+-----------+----------+--------------+ PFV      Full                                                        +---------+---------------+---------+-----------+----------+--------------+ POP      Full           Yes      Yes                                 +---------+---------------+---------+-----------+----------+--------------+ PTV      Full                                                        +---------+---------------+---------+-----------+----------+--------------+ PERO     Full                                                        +---------+---------------+---------+-----------+----------+--------------+ Gastroc  Full                                                        +---------+---------------+---------+-----------+----------+--------------+    Summary: RIGHT: - There is no evidence of deep vein thrombosis in the lower extremity. However, portions of this examination were limited- see technologist comments above.  - No cystic structure found in the popliteal fossa.  LEFT: - There is no evidence of deep vein thrombosis in the lower extremity.  - No cystic structure found in the popliteal fossa.  *See table(s) above for measurements and observations.    Preliminary    Recent Labs    01/04/22 0552 01/05/22 0540  WBC 7.7 8.7  HGB 12.4  11.8*  HCT 34.9* 33.6*  PLT 300 304   Recent Labs    01/04/22 0552 01/05/22 0540  NA 137 137  K 4.2 3.9  CL 105 107  CO2 23 23  GLUCOSE 106* 110*  BUN 11 15  CREATININE 0.73 0.78  CALCIUM 9.1 8.8*    Intake/Output Summary (Last 24 hours) at 01/06/2022 1223 Last data filed at 01/05/2022 1849 Gross per 24 hour  Intake 420 ml  Output --  Net 420 ml        Physical Exam: Vital Signs Blood pressure 128/71, pulse 62, temperature 98.4 F (36.9 C), resp. rate 14, height 5\' 7"  (1.702 m), weight 88.5 kg, SpO2 96 %. Gen: no distress, normal appearing HEENT: oral mucosa pink and moist, NCAT Cardio: Regular heart rate Chest: normal effort, normal rate of breathing Abd: soft, non-distended Ext: no edema Psych: pleasant, normal affect Skin: splint in place to right lower extremity. Bruising of left arm. Would care dressing to left knee abraison Neuro: Alert x3 and follows commands   Assessment/Plan: 1. Functional deficits which require 3+ hours per day of interdisciplinary therapy in a comprehensive inpatient rehab setting. Physiatrist is providing close team supervision and 24 hour management of active medical problems listed below. Physiatrist and rehab team continue to assess barriers to discharge/monitor patient progress toward functional and medical goals  Care Tool:  Bathing    Body parts bathed by patient: Right arm, Left arm, Chest, Abdomen, Front perineal area, Right upper leg, Left upper leg, Left lower leg, Face   Body parts bathed by helper: Buttocks     Bathing assist Assist Level: Minimal Assistance - Patient > 75%     Upper Body Dressing/Undressing Upper body dressing   What is the patient wearing?: Button up shirt    Upper body assist Assist Level: Minimal Assistance - Patient > 75%    Lower Body Dressing/Undressing Lower body dressing      What is the patient wearing?: Underwear/pull up, Pants     Lower body assist Assist for lower body  dressing: Moderate Assistance - Patient 50 - 74%     Toileting Toileting    Toileting assist Assist for toileting: Minimal Assistance - Patient > 75%     Transfers Chair/bed transfer  Transfers assist     Chair/bed transfer assist level: Minimal Assistance - Patient > 75%     Locomotion Ambulation   Ambulation assist   Ambulation activity did not occur: Safety/medical concerns  Assist level: 2 helpers Assistive device: Walker-platform Max distance: 22ft   Walk 10 feet activity   Assist  Walk 10 feet activity did not occur: Safety/medical concerns (low BP, decreased balance, pain)        Walk 50 feet activity   Assist Walk 50 feet with 2 turns activity did not occur: Safety/medical concerns (low BP, decreased balance, pain)         Walk 150 feet activity   Assist Walk 150 feet activity did not occur: Safety/medical concerns (low BP, decreased balance, pain)         Walk 10 feet on uneven surface  activity   Assist Walk 10 feet on uneven surfaces activity did not occur: Safety/medical concerns (low BP, decreased balance, pain)         Wheelchair     Assist Is the patient using a wheelchair?: Yes Type of Wheelchair: Manual Wheelchair activity did not occur: Safety/medical concerns (low BP)  Wheelchair assist level: Supervision/Verbal cueing Max wheelchair distance: 142ft    Wheelchair 50 feet with 2 turns activity    Assist    Wheelchair 50 feet with 2 turns activity did not occur: Safety/medical concerns (low BP)   Assist Level: Supervision/Verbal cueing   Wheelchair 150 feet activity     Assist  Wheelchair 150 feet activity did not occur: Safety/medical concerns (low BP)       Blood pressure 128/71, pulse 62, temperature 98.4 F (36.9 C), resp. rate 14, height 5\' 7"  (  1.702 m), weight 88.5 kg, SpO2 96 %.  Medical Problem List and Plan: 1. Functional deficits secondary to closed right trimalleolar ankle fracture  dislocation status post ORIF and closed left scapular body fracture nonoperative with conservative care.  Nonweightbearing right lower extremity x8 weeks with splint x2 weeks then convert to CAM for range of motion exercises.  Weightbearing as tolerated left upper extremity with sling for comfort.             -patient may not shower             -ELOS/Goals: 10-14 days modI           Continue CIR 2.  Antithrombotics: -DVT/anticoagulation:  Pharmaceutical: Lovenox.  Check vascular study.  Convert to Xarelto 10 mg daily x30 days at discharge.             -antiplatelet therapy: N/A 3. Pain: continue Lidoderm patch, Robaxin 1000 mg 4 times daily, oxycodone as needed 4. Mood: Provide emotional support             -antipsychotic agents: N/A 5. Neuropsych: This patient is capable of making decisions on her own behalf. 6. Skin/Wound Care: Routine skin checks 7. Fluids/Electrolytes/Nutrition: Routine in and outs with follow-up chemistries 8.  T11/12 endplate fracture.  Lumbar corset as directed per Dr. Kathyrn Sheriff 9.  Left rib fractures 2 and 6.  Conservative care 10.  Urinary retention.  Continue Urecholine 25 mg 3 times daily.  Check PVR 11.  Atrial fibrillation with RVR.  Follow-up cardiology services.  Flecainide 100 mg every 12 hours, Lopressor 37.5 mg twice daily. Start magnesium gluconate 250mg  HS.  12.  Constipation.  MiraLAX daily, Colace 100 mg twice daily. Start magnesium gluconate 250mg  HS. Check magnesium level tomorrow morning.  13. Vitamin D deficiency: Level is 17: started ergocalciferol 50,000U once per week every 7 weeks on 5/17.  14. Transaminitis: Tylenol changed to PRN, repeat CMP Monday.  15. Hypotension: abdominal binder and TEDs ordered for therapy- did not need today since AM afib meds were held.  16. Bradycardia: decrease lopressor back to 37.5mg  BID. Cardiology consulted to see if lopressor can be changed to Toprol XL HS to minimize hypotension during therapy.  17. Left scapular  fracture with worsening pain: MRI ordered to assess for rotator cuff tear.    LOS: 2 days A FACE TO FACE EVALUATION WAS PERFORMED  Terri Drake 01/06/2022, 12:23 PM

## 2022-01-07 DIAGNOSIS — I480 Paroxysmal atrial fibrillation: Secondary | ICD-10-CM

## 2022-01-07 DIAGNOSIS — S42102A Fracture of unspecified part of scapula, left shoulder, initial encounter for closed fracture: Secondary | ICD-10-CM

## 2022-01-07 MED ORDER — BETHANECHOL CHLORIDE 10 MG PO TABS
5.0000 mg | ORAL_TABLET | Freq: Two times a day (BID) | ORAL | Status: DC
  Administered 2022-01-08: 5 mg via ORAL
  Filled 2022-01-07: qty 1

## 2022-01-07 NOTE — Progress Notes (Addendum)
Occupational Therapy Session Note  Patient Details  Name: RALIYAH MONTELLA MRN: [id number] Date of Birth: 05-May-1961  Today's Date: 01/07/2022 OT Individual Time: 0702-0800 OT Individual Time Calculation (min): 58 min   OT Individual Time: 1241-1326  OT Individual Time Calculation (min): 45 min    Short Term Goals: Week 1:  OT Short Term Goal 1 (Week 1): Patient will stand pivot transfer with platform walker to [facility] with min assist OT Short Term Goal 2 (Week 1): Patient will stand to pull up/down LB clothing with intermittent min assistance OT Short Term Goal 3 (Week 1): Patient will demonstrate 60* of shoulder flexion with LUE without report of increased pain  Skilled Therapeutic Interventions/Progress Updates:  Session 1: Patient met lying supine in bed in agreement with OT treatment session. 5/10 pain reported in R shoulder at rest and with activity. Denies pain in RLE. Orthostatic vitals assessed. Please refer below for additional details. Denies dizziness with transition of supine to EOB but notes dizziness in standing requiring seated rest break. Completes all bed mobility with sup A. Hops to [facility] with CGA-Min A and cues for hand placement. 3/3 parts of toileting task with Min A for clothing management. Patient somewhat slow to respond requiring increased time. Repeat reminders needed for goals of session. Seated at sink level patient completed UB bathing/dressing with set-up A and LB bathing/dressing with Min A. Patient reports mepilex boarder on sacrum x several days noting strong odor. Skin intact beneath. Mepilex boarder removed. Able to wash front perineal area and buttocks in standing with CGA. Demonstrates FAIR+ balance with unilateral UE support on sink surface. Per patient request new mepilex boarder applied. Education on removing with soiling or prior to bathing. Patient expressed verbal understanding. Seated rest breaks throughout session. After threading BLE through pants in sitting  patient reported increased dizziness. Vitals assessed. Details below. Session concluded with patient seated in wc with call bell within reach, chair alarm activated and all needs met.  Orthostatic vitals  Supine: 109/42 EOB: 99/65 Standing: 87/52 After LB dressing: 103/58  Session 2: Met for afternoon treatment session. Able to self-propel wc from room to and from therapy gym with RUE and LLE. Focus shifted to AAROM at LUE with stacking cones. Patient completed 2 sets x10 reps of towel slides. Declined soft tissue massage to scapular musculature. Reports 6/10 pain in L scapula with shoulder flexion, elbow flexion and slight internal rotation. Rest breaks throughout for pain management. Seated in wc in front of mat table patient completed 1 set x10 reps of ball rolls with small yellow yoga ball.     Therapy Documentation Precautions:  Precautions Precautions: Back, Fall Precaution Comments: orthostatic hypotension 5/15 Required Braces or Orthoses: Sling, Other Brace Other Brace: LSO for OOB, sling for comfort (allowed to move LUE) Restrictions Weight Bearing Restrictions: Yes LUE Weight Bearing: Weight bearing as tolerated RLE Weight Bearing: Non weight bearing General:   Therapy/Group: Individual Therapy  Eliyahu Bille R Howerton-Davis 01/07/2022, 1:20 PM

## 2022-01-07 NOTE — Progress Notes (Signed)
PROGRESS NOTE   Subjective/Complaints: BP improved Shoulder was hurting a lot after MRI yesterday and so she took an oxycodone 10mg  at night  ROS: +dizziness with low blood pressure, +left sided scapular pain, voiding well   Objective:   VAS [place] LOWER EXTREMITY VENOUS (DVT)  Result Date: 01/06/2022  Lower Venous DVT Study Patient Name:  Terri Drake  Date of Exam:   01/06/2022 Medical Rec #: [id number]          Accession #:    [id number] Date of Birth: 02-26-1961           Patient Gender: F Patient Age:   61 years Exam Location:  [facility] Procedure:      VAS [place] LOWER EXTREMITY VENOUS (DVT) Referring Phys: Lauraine Rinne --------------------------------------------------------------------------------  Indications: Swelling, s/p trauma, RT ankle ORIF.  Risk Factors: Immobility. Anticoagulation: Lovenox. Limitations: Plaster cast right ankle/calf. Comparison Study: No prior studies. Performing Technologist: Darlin Coco RDMS, RVT  Examination Guidelines: A complete evaluation includes B-mode imaging, spectral Doppler, color Doppler, and power Doppler as needed of all accessible portions of each vessel. Bilateral testing is considered an integral part of a complete examination. Limited examinations for reoccurring indications may be performed as noted. The reflux portion of the exam is performed with the patient in reverse Trendelenburg.  +---------+---------------+---------+-----------+----------+-------------------+ RIGHT    CompressibilityPhasicitySpontaneityPropertiesThrombus Aging      +---------+---------------+---------+-----------+----------+-------------------+ CFV      Full           Yes      Yes                                      +---------+---------------+---------+-----------+----------+-------------------+ SFJ      Full                                                              +---------+---------------+---------+-----------+----------+-------------------+ FV Prox  Full                                                             +---------+---------------+---------+-----------+----------+-------------------+ FV Mid   Full                                                             +---------+---------------+---------+-----------+----------+-------------------+ FV DistalFull                                                             +---------+---------------+---------+-----------+----------+-------------------+  PFV      Full                                                             +---------+---------------+---------+-----------+----------+-------------------+ POP      Full           Yes      Yes                                      +---------+---------------+---------+-----------+----------+-------------------+ PTV                                                   Unable to visualize                                                       due to cast         +---------+---------------+---------+-----------+----------+-------------------+ PERO                                                  Unable to visualize                                                       due to cast         +---------+---------------+---------+-----------+----------+-------------------+ Gastroc  Full                                                             +---------+---------------+---------+-----------+----------+-------------------+   +---------+---------------+---------+-----------+----------+--------------+ LEFT     CompressibilityPhasicitySpontaneityPropertiesThrombus Aging +---------+---------------+---------+-----------+----------+--------------+ CFV      Full           Yes      Yes                                 +---------+---------------+---------+-----------+----------+--------------+ SFJ      Full                                                         +---------+---------------+---------+-----------+----------+--------------+ FV Prox  Full                                                        +---------+---------------+---------+-----------+----------+--------------+  FV Mid   Full                                                        +---------+---------------+---------+-----------+----------+--------------+ FV DistalFull                                                        +---------+---------------+---------+-----------+----------+--------------+ PFV      Full                                                        +---------+---------------+---------+-----------+----------+--------------+ POP      Full           Yes      Yes                                 +---------+---------------+---------+-----------+----------+--------------+ PTV      Full                                                        +---------+---------------+---------+-----------+----------+--------------+ PERO     Full                                                        +---------+---------------+---------+-----------+----------+--------------+ Gastroc  Full                                                        +---------+---------------+---------+-----------+----------+--------------+     Summary: RIGHT: - There is no evidence of deep vein thrombosis in the lower extremity. However, portions of this examination were limited- see technologist comments above.  - No cystic structure found in the popliteal fossa.  LEFT: - There is no evidence of deep vein thrombosis in the lower extremity.  - No cystic structure found in the popliteal fossa.  *See table(s) above for measurements and observations. Electronically signed by Jamelle Haring on 01/06/2022 at 5:26:10 PM.    Final    Recent Labs    01/05/22 0540  WBC 8.7  HGB 11.8*  HCT 33.6*  PLT 304   Recent Labs    01/05/22 0540  NA  137  K 3.9  CL 107  CO2 23  GLUCOSE 110*  BUN 15  CREATININE 0.78  CALCIUM 8.8*    Intake/Output Summary (Last 24 hours) at 01/07/2022 1429 Last data filed at 01/07/2022 1244 Gross per 24 hour  Intake 240 ml  Output --  Net 240 ml  Physical Exam: Vital Signs Blood pressure (!) 115/53, pulse 73, temperature 98.4 F (36.9 C), temperature source Oral, resp. rate 16, height 5\' 7"  (1.702 m), weight 88.5 kg, SpO2 97 %. Gen: no distress, normal appearing HEENT: oral mucosa pink and moist, NCAT Cardio: Regular heart rate Chest: normal effort, normal rate of breathing Abd: soft, non-distended Ext: no edema Psych: pleasant, normal affect Skin: splint in place to right lower extremity. Bruising of left arm. Would care dressing to left knee abraison Neuro: Alert x3 and follows commands Sit to stand MinA   Assessment/Plan: 1. Functional deficits which require 3+ hours per day of interdisciplinary therapy in a comprehensive inpatient rehab setting. Physiatrist is providing close team supervision and 24 hour management of active medical problems listed below. Physiatrist and rehab team continue to assess barriers to discharge/monitor patient progress toward functional and medical goals  Care Tool:  Bathing    Body parts bathed by patient: Right arm, Left arm, Chest, Abdomen, Front perineal area, Right upper leg, Left upper leg, Left lower leg, Face   Body parts bathed by helper: Buttocks     Bathing assist Assist Level: Minimal Assistance - Patient > 75%     Upper Body Dressing/Undressing Upper body dressing   What is the patient wearing?: Button up shirt    Upper body assist Assist Level: Minimal Assistance - Patient > 75%    Lower Body Dressing/Undressing Lower body dressing      What is the patient wearing?: Underwear/pull up, Pants     Lower body assist Assist for lower body dressing: Moderate Assistance - Patient 50 - 74%     Toileting Toileting     Toileting assist Assist for toileting: Minimal Assistance - Patient > 75%     Transfers Chair/bed transfer  Transfers assist     Chair/bed transfer assist level: Minimal Assistance - Patient > 75%     Locomotion Ambulation   Ambulation assist   Ambulation activity did not occur: Safety/medical concerns  Assist level: 2 helpers Assistive device: Walker-platform Max distance: 16ft   Walk 10 feet activity   Assist  Walk 10 feet activity did not occur: Safety/medical concerns (low BP, decreased balance, pain)        Walk 50 feet activity   Assist Walk 50 feet with 2 turns activity did not occur: Safety/medical concerns (low BP, decreased balance, pain)         Walk 150 feet activity   Assist Walk 150 feet activity did not occur: Safety/medical concerns (low BP, decreased balance, pain)         Walk 10 feet on uneven surface  activity   Assist Walk 10 feet on uneven surfaces activity did not occur: Safety/medical concerns (low BP, decreased balance, pain)         Wheelchair     Assist Is the patient using a wheelchair?: Yes Type of Wheelchair: Manual Wheelchair activity did not occur: Safety/medical concerns (low BP)  Wheelchair assist level: Supervision/Verbal cueing Max wheelchair distance: 112ft    Wheelchair 50 feet with 2 turns activity    Assist    Wheelchair 50 feet with 2 turns activity did not occur: Safety/medical concerns (low BP)   Assist Level: Supervision/Verbal cueing   Wheelchair 150 feet activity     Assist  Wheelchair 150 feet activity did not occur: Safety/medical concerns (low BP)       Blood pressure (!) 115/53, pulse 73, temperature 98.4 F (36.9 C), temperature source Oral, resp. rate 16, height  5\' 7"  (1.702 m), weight 88.5 kg, SpO2 97 %.  Medical Problem List and Plan: 1. Functional deficits secondary to closed right trimalleolar ankle fracture dislocation status post ORIF and closed left scapular  body fracture nonoperative with conservative care.  Nonweightbearing right lower extremity x8 weeks with splint x2 weeks then convert to CAM for range of motion exercises.  Weightbearing as tolerated left upper extremity with sling for comfort.             -patient may not shower             -ELOS/Goals: 10-14 days modI          Continue CIR 2.  Antithrombotics: -DVT/anticoagulation:  Pharmaceutical: Lovenox.  Check vascular study.  Convert to Xarelto 10 mg daily x30 days at discharge.             -antiplatelet therapy: N/A 3. Pain: continue Lidoderm patch, Robaxin 1000 mg 4 times daily, oxycodone as needed 4. Mood: Provide emotional support             -antipsychotic agents: N/A 5. Neuropsych: This patient is capable of making decisions on her own behalf. 6. Skin/Wound Care: Routine skin checks 7. Fluids/Electrolytes/Nutrition: Routine in and outs with follow-up chemistries 8.  T11/12 endplate fracture.  Lumbar corset as directed per Dr. Kathyrn Sheriff 9.  Left rib fractures 2 and 6.  Conservative care 10.  Urinary retention.  Continue Urecholine 25 mg 3 times daily.  Check PVR 11.  Atrial fibrillation with RVR.  Follow-up cardiology services.  Flecainide 100 mg every 12 hours, Lopressor 37.5 mg twice daily. Start magnesium gluconate 250mg  HS.  12.  Constipation.  MiraLAX daily, Colace 100 mg twice daily. Start magnesium gluconate 250mg  HS. Check magnesium level tomorrow morning.  13. Vitamin D deficiency: Level is 17: started ergocalciferol 50,000U once per week every 7 weeks on 5/17.  14. Transaminitis: Tylenol changed to PRN, repeat CMP Monday.  15. Hypotension: abdominal binder and TEDs ordered for therapy- did not need today since AM afib meds were held.  16. Bradycardia: Toprol changed to 25mg  XL HS.  17. Left scapular fracture with worsening pain: MRI ordered to assess for rotator cuff tear. Discussed that results are pending, will discuss with patient once available.  18. Hypotension:  improved, continue abdominal binder and TEDs.     LOS: 3 days A FACE TO FACE EVALUATION WAS PERFORMED  Clide Deutscher Lake[place]ia [place] 01/07/2022, 2:29 PM

## 2022-01-07 NOTE — Progress Notes (Signed)
Physical Therapy Session Note  Patient Details  Name: Terri Drake MRN: [id number] Date of Birth: Jun 27, 1961  Today's Date: 01/07/2022 PT Individual Time: 3875-6433 and 1100-1159 PT Individual Time Calculation (min): 24 min and 59 min  Short Term Goals: Week 1:  PT Short Term Goal 1 (Week 1): pt will transfer bed<>chair with LRAD and supervision PT Short Term Goal 2 (Week 1): pt will ambulate 53ft with LRAD and min A PT Short Term Goal 3 (Week 1): pt will perform simulated car transfer with LRAD and CGA  Skilled Therapeutic Interventions/Progress Updates:   Treatment Session 1 Received pt sitting in WC, pt agreeable to PT treatment, and reported pain 5/10 in L shoulder (pt declined any pain medication). Husband Jorja Loa, arrived during session and began family education training. Session with emphasis on functional mobility/transfers, dynamic standing balance/coordination, and gait training. Pt performed WC mobility 113ft x 2 trials using BUE and LLE and supervision to/from dayroom. Sit<>stand with L PFRW and min A x 2 trials and pt ambulated 5 hops x 1 and 11 hops x 1 with L PFRW and min A with close WC follow - pt demonstrating good adherence to RLE NWB precautions throughout session. Concluded session with pt sitting in [facility] with all needs within reach and husband present at bedside.   Orthostatics - abdominal binder and L ted hose donned Sitting: 128/63 Sitting after ambulating trial 1: 99/47 - pt reporting dizziness but improved to 114/69 with 2 minute rest Sitting after ambulating trial 2: 114/59  Treatment Session 2 Received pt sitting in WC, pt agreeable to PT tratment, and reported pain 5/10 in L shoulder (declined any pain medication). Session with emphasis on functional mobility/transfers, generalized strengthening and endurance, dynamic standing balance/coordination, simulated car transfers, and gait training. Pt performed WC mobility >352ft x 2 trials using BUE and LLE to/from ortho  gym. Pt performed simulated car transfer with L PFRW and min A with cues for hand placement and turning technique - good adherence to RLE NWB precautions. Pt performed seated BUE strengthening on UBE at level 1 for 4 minutes forward and 1.5 minutes backwards - emphasis on L shoulder strength/ROM. Sit<>stand with L PFRW and min A and pt ambulated 66ft (12 hops) and 35ft x 1 (14 hops) with close WC follow. Concluded session with pt sitting in [facility] with all needs within reach. Provided pt with ice pack for L shoulder.   Orthostatics - abdominal binder and L ted hose donned Sitting at rest: 114/64 Sitting after car transfer: 119/58 - pt reporting mild dizziness Sitting after UBE: 115/54 Sitting after ambulation on trial 1: 113/58 Sitting after ambulation on trial 2: 111/53  Therapy Documentation Precautions:  Precautions Precautions: Back, Fall Precaution Comments: orthostatic hypotension 5/15 Required Braces or Orthoses: Sling, Other Brace Other Brace: LSO for OOB, sling for comfort (allowed to move LUE) Restrictions Weight Bearing Restrictions: Yes LUE Weight Bearing: Weight bearing as tolerated RLE Weight Bearing: Non weight bearing  Therapy/Group: Individual Therapy Martin Majestic PT, DPT  01/07/2022, 7:13 AM

## 2022-01-07 NOTE — IPOC Note (Signed)
Overall Plan of Care [facility]) Patient Details Name: Terri Drake MRN: [id number] DOB: 1961/05/01  Admitting Diagnosis: Trimalleolar fracture  Hospital Problems: Principal Problem:   Trimalleolar fracture Active Problems:   PAF (paroxysmal atrial fibrillation) (HCC)     Functional Problem List: Nursing Edema, Endurance, Medication Management, Motor, Pain, Safety  PT Balance, Edema, Endurance, Motor, Pain, Skin Integrity  OT Balance, Edema, Endurance, Safety, Sensory, Pain  SLP    TR         Basic ADL's: OT Grooming, Bathing, Dressing, Toileting     Advanced  ADL's: OT Simple Meal Preparation     Transfers: PT Bed Mobility, Bed to Chair, Car, Occupational psychologist, Research scientist (life sciences): PT Ambulation, Psychologist, prison and probation services, Stairs     Additional Impairments: OT Fuctional Use of Upper Extremity  SLP        TR      Anticipated Outcomes Item Anticipated Outcome  Self Feeding    Swallowing      Basic self-care  set up supervision  Toileting  Mod I   Bathroom Transfers Mod I  Bowel/Bladder  n/a  Transfers  mod I with LRAD  Locomotion  supervision with LRAD  Communication     Cognition     Pain  < 3  Safety/Judgment  min assist   Therapy Plan: PT Intensity: Minimum of 1-2 x/day ,45 to 90 minutes PT Frequency: 5 out of 7 days PT Duration Estimated Length of Stay: 10-14 days OT Intensity: Minimum of 1-2 x/day, 45 to 90 minutes OT Frequency: 5 out of 7 days OT Duration/Estimated Length of Stay: 10-14 days     Team Interventions: Nursing Interventions Patient/Family Education, Disease Management/Prevention, Pain Management, Medication Management, Skin Care/Wound Management, Discharge Planning  PT interventions Ambulation/gait training, Discharge planning, Functional mobility training, Psychosocial support, Therapeutic Activities, Visual/perceptual remediation/compensation, Balance/vestibular training, Disease management/prevention, Neuromuscular  re-education, Skin care/wound management, Therapeutic Exercise, Wheelchair propulsion/positioning, DME/adaptive equipment instruction, Pain management, Splinting/orthotics, UE/LE Strength taining/ROM, Firefighter, Equities trader education, Museum/gallery curator, UE/LE Coordination activities  OT Interventions Warden/ranger, Discharge planning, Pain management, Self Care/advanced ADL retraining, Therapeutic Activities, UE/LE Coordination activities, Functional mobility training, Patient/family education, Therapeutic Exercise, DME/adaptive equipment instruction, UE/LE Strength taining/ROM  SLP Interventions    TR Interventions    SW/CM Interventions Discharge Planning, Psychosocial Support, Patient/Family Education, Disease Management/Prevention   Barriers to Discharge MD  Medical stability  Nursing Decreased caregiver support, Home environment access/layout, Wound Care, Lack of/limited family support, Weight bearing restrictions 1 level, 3 steps, no rails. Spouse can provide min assist at discharge.  PT Inaccessible home environment, Home environment access/layout, Wound Care, Weight bearing restrictions, Other (comments) low BP, pain, 3 STE with no rails, altered balance strategies due to NWB precautions  OT Home environment access/layout can stay on first level, hopes to get to second level (multiple stairs)  SLP      SW Insurance for SNF coverage, Lack of/limited family support, Decreased caregiver support     Team Discharge Planning: Destination: PT-Home ,OT- Home , SLP-  Projected Follow-up: PT-Home health PT, OT-  Home health OT, SLP-  Projected Equipment Needs: PT-To be determined, OT- To be determined, 3 in 1 bedside comode, Tub/shower seat, SLP-  Equipment Details: PT-has SPC, OT-  Patient/family involved in discharge planning: PT- Patient,  OT-Patient, SLP-   MD ELOS: 10-14 days Medical Rehab Prognosis:  Excellent Assessment: The patient has been admitted for  CIR therapies with the diagnosis of right trimalleolar ankle fracture and left scapular  body fracture. The team will be addressing functional mobility, strength, stamina, balance, safety, adaptive techniques and equipment, self-care, bowel and bladder mgt, patient and caregiver education. Goals have been set at modI. Anticipated discharge destination is home.        See Team Conference Notes for weekly updates to the plan of care

## 2022-01-08 DIAGNOSIS — I480 Paroxysmal atrial fibrillation: Secondary | ICD-10-CM | POA: Diagnosis not present

## 2022-01-08 MED ORDER — BETHANECHOL CHLORIDE 10 MG PO TABS
5.0000 mg | ORAL_TABLET | Freq: Every day | ORAL | Status: DC
  Administered 2022-01-09 – 2022-01-10 (×2): 5 mg via ORAL
  Filled 2022-01-08 (×2): qty 1

## 2022-01-08 NOTE — Progress Notes (Signed)
PROGRESS NOTE   Subjective/Complaints: Still voiding well, discussed decreasing bethanecol to 5mg  daily and discontinuing tomorrow if she is still voiding well and she is agreeable  ROS: +dizziness with low blood pressure, +left sided scapular pain, voiding well, moved bowels yesterday   Objective:   MR SHOULDER LEFT WO CONTRAST  Result Date: 01/07/2022 CLINICAL DATA:  Left shoulder pain.  Known left scapular fracture EXAM: MRI OF THE LEFT SHOULDER WITHOUT CONTRAST TECHNIQUE: Multiplanar, multisequence MR imaging of the shoulder was performed. No intravenous contrast was administered. COMPARISON:  X-ray 12/27/2021 FINDINGS: Rotator cuff: The supraspinatus, infraspinatus, subscapularis, and teres minor tendons are intact. Muscles: Prominent intramuscular edema within the subscapularis and infraspinatus muscles adjacent to the scapular fracture site as well as within the teres major muscle. Areas of distortion of the normal muscle architecture with small intramuscular fluid collections compatible with traumatic muscle tears and multiple small intramuscular hematomas. For reference, two subcentimeter intramuscular hematomas are seen within the infraspinatus muscle belly (series 5, images 4 and 9). No muscle atrophy or fatty infiltration. Biceps long head: Intact and appropriately positioned. Small amount of fluid in the biceps tendon sheath. Acromioclavicular Joint: Mild-moderate degenerative changes of the AC joint. No significant subacromial-subdeltoid bursal fluid. Glenohumeral Joint: No joint effusion. No cartilage defect. Labrum: Grossly intact although evaluation is limited in the absence of intra-articular fluid. No paralabral cyst. Bones: Acute fracture involving the infraspinous aspect of the scapular body with approximately 1.0 cm of posterior displacement. No fracture involvement of the glenoid. Proximal humerus intact. Glenohumeral joint  alignment is maintained without dislocation. No evidence of a underlying bone lesion. Other: Prominent soft tissue edema surrounding the fracture site. IMPRESSION: 1. Acute moderately displaced fracture of the left scapular body. 2. Prominent intramuscular edema surrounding the scapular fracture site. Associated traumatic muscle tears with multiple small intramuscular hematomas, most pronounced within the infraspinatus and subscapularis muscles. 3. Intact rotator cuff. Electronically Signed   By: Davina Poke D.O.   On: 01/07/2022 14:59   No results for input(s): WBC, HGB, HCT, PLT in the last 72 hours.  No results for input(s): NA, K, CL, CO2, GLUCOSE, BUN, CREATININE, CALCIUM in the last 72 hours.   Intake/Output Summary (Last 24 hours) at 01/08/2022 1820 Last data filed at 01/08/2022 1326 Gross per 24 hour  Intake 240 ml  Output --  Net 240 ml        Physical Exam: Vital Signs Blood pressure (!) 144/58, pulse 74, temperature 97.7 F (36.5 C), resp. rate 16, height 5\' 7"  (1.702 m), weight 88.5 kg, SpO2 100 %. Gen: no distress, normal appearing, BMI 30.56 HEENT: oral mucosa pink and moist, NCAT Cardio: Regular heart rate Chest: normal effort, normal rate of breathing Abd: soft, non-distended Ext: no edema Psych: pleasant, normal affect Skin: splint in place to right lower extremity. Bruising of left arm. Would care dressing to left knee abraison Neuro: Alert x3 and follows commands Sit to stand MinA   Assessment/Plan: 1. Functional deficits which require 3+ hours per day of interdisciplinary therapy in a comprehensive inpatient rehab setting. Physiatrist is providing close team supervision and 24 hour management of active medical problems listed below. Physiatrist and rehab team  continue to assess barriers to discharge/monitor patient progress toward functional and medical goals  Care Tool:  Bathing    Body parts bathed by patient: Right arm, Left arm, Chest, Abdomen,  Front perineal area, Right upper leg, Left upper leg, Left lower leg, Face   Body parts bathed by helper: Buttocks     Bathing assist Assist Level: Minimal Assistance - Patient > 75%     Upper Body Dressing/Undressing Upper body dressing   What is the patient wearing?: Button up shirt    Upper body assist Assist Level: Minimal Assistance - Patient > 75%    Lower Body Dressing/Undressing Lower body dressing      What is the patient wearing?: Underwear/pull up, Pants     Lower body assist Assist for lower body dressing: Moderate Assistance - Patient 50 - 74%     Toileting Toileting    Toileting assist Assist for toileting: Minimal Assistance - Patient > 75%     Transfers Chair/bed transfer  Transfers assist     Chair/bed transfer assist level: Minimal Assistance - Patient > 75%     Locomotion Ambulation   Ambulation assist   Ambulation activity did not occur: Safety/medical concerns  Assist level: 2 helpers Assistive device: Walker-platform Max distance: 64ft   Walk 10 feet activity   Assist  Walk 10 feet activity did not occur: Safety/medical concerns (low BP, decreased balance, pain)        Walk 50 feet activity   Assist Walk 50 feet with 2 turns activity did not occur: Safety/medical concerns (low BP, decreased balance, pain)         Walk 150 feet activity   Assist Walk 150 feet activity did not occur: Safety/medical concerns (low BP, decreased balance, pain)         Walk 10 feet on uneven surface  activity   Assist Walk 10 feet on uneven surfaces activity did not occur: Safety/medical concerns (low BP, decreased balance, pain)         Wheelchair     Assist Is the patient using a wheelchair?: Yes Type of Wheelchair: Manual Wheelchair activity did not occur: Safety/medical concerns (low BP)  Wheelchair assist level: Supervision/Verbal cueing Max wheelchair distance: 159ft    Wheelchair 50 feet with 2 turns  activity    Assist    Wheelchair 50 feet with 2 turns activity did not occur: Safety/medical concerns (low BP)   Assist Level: Supervision/Verbal cueing   Wheelchair 150 feet activity     Assist  Wheelchair 150 feet activity did not occur: Safety/medical concerns (low BP)       Blood pressure (!) 144/58, pulse 74, temperature 97.7 F (36.5 C), resp. rate 16, height 5\' 7"  (1.702 m), weight 88.5 kg, SpO2 100 %.  Medical Problem List and Plan: 1. Functional deficits secondary to closed right trimalleolar ankle fracture dislocation status post ORIF and closed left scapular body fracture nonoperative with conservative care.  Nonweightbearing right lower extremity x8 weeks with splint x2 weeks then convert to CAM for range of motion exercises.  Weightbearing as tolerated left upper extremity with sling for comfort.             -patient may not shower             -ELOS/Goals: 10-14 days modI          Continue CIR 2.  Antithrombotics: -DVT/anticoagulation:  Pharmaceutical: Lovenox.  Check vascular study.  Convert to Xarelto 10 mg daily x30 days at discharge.             -  antiplatelet therapy: N/A 3. Pain: continue Lidoderm patch, Robaxin 1000 mg 4 times daily, oxycodone as needed. Discussed amitriptyline at night but she refuses given potential side effects 4. Insomnia: Continue 5-10mg  oxycodone HS prn to help her sleep 5. Neuropsych: This patient is capable of making decisions on her own behalf. 6. Skin/Wound Care: Routine skin checks 7. Fluids/Electrolytes/Nutrition: Routine in and outs with follow-up chemistries 8.  T11/12 endplate fracture.  Lumbar corset as directed per Dr. Kathyrn Sheriff 9.  Left rib fractures 2 and 6.  Conservative care 10.  Urinary retention.  Decrease urecholine to 5mg  daily.   11.  Atrial fibrillation with RVR.  Follow-up cardiology services.  Flecainide 100 mg every 12 hours, Lopressor 37.5 mg twice daily. Start magnesium gluconate 250mg  HS.  12.   Constipation.  MiraLAX daily, Colace 100 mg twice daily. Start magnesium gluconate 250mg  HS. Check magnesium level tomorrow morning.  13. Vitamin D deficiency: Level is 17: started ergocalciferol 50,000U once per week every 7 weeks on 5/17.  14. Transaminitis: Tylenol changed to PRN, repeat CMP Monday.  15. Hypotension: abdominal binder and TEDs ordered for therapy- did not need today since AM afib meds were held.  16. Bradycardia: Toprol changed to 25mg  XL HS.  17. Left scapular fracture with worsening pain: MRI ordered and results discussed with patient 18. Hypotension: improved, continue abdominal binder and TEDs.     LOS: 4 days A FACE TO FACE EVALUATION WAS PERFORMED  Clide Deutscher Terri Drake 01/08/2022, 6:20 PM

## 2022-01-08 NOTE — Progress Notes (Signed)
Progress Note  Patient Name: Terri Drake Date of Encounter: 01/08/2022  [facility] Cardiologist: Freada Bergeron, MD   Subjective   Pt upper body is sore   Breathing is OK at rest   Not dizzy while sitting   Does get dizzy at PT   Not as bad today yas first couple ddays   BP is a little higher   Inpatient Medications    Scheduled Meds:  vitamin C  500 mg Oral Daily   bacitracin   Topical BID   bethanechol  5 mg Oral BID   enoxaparin (LOVENOX) injection  30 mg Subcutaneous Q12H   flecainide  100 mg Oral Q12H   latanoprost  1 drop Both Eyes QHS   lidocaine  1 patch Transdermal Q24H   magnesium gluconate  250 mg Oral QHS   metoprolol succinate  25 mg Oral QHS   Vitamin D (Ergocalciferol)  50,000 Units Oral Q7 days   zinc sulfate  220 mg Oral Daily   Continuous Infusions:  PRN Meds: acetaminophen, magnesium hydroxide, methocarbamol, metoprolol tartrate, oxyCODONE   Vital Signs    Vitals:   01/08/22 0323 01/08/22 1326 01/08/22 1337 01/08/22 1339  BP: 124/62 134/61 (!) 137/50 (!) 128/59  Pulse: (!) 56 66 75 68  Resp: 15 16    Temp: 98.1 F (36.7 C) 97.7 F (36.5 C)    TempSrc:      SpO2: 95% 100%    Weight:      Height:       No intake or output data in the 24 hours ending 01/08/22 1416    01/04/2022    2:01 PM 12/27/2021    5:48 PM 11/04/2021    8:31 AM  Last 3 Weights  Weight (lbs) 195 lb 1.7 oz 191 lb 194 lb 9.6 oz  Weight (kg) 88.5 kg 86.637 kg 88.27 kg      Telemetry    No tele - Personally Reviewed  ECG    No new  - Personally Reviewed  Physical Exam  Pt in NAD in wheelchair  Arm in sling (L) GEN: No acute distress.   Neck: No JVD Cardiac: RRR, no murmurs, Respiratory: Clear to auscultation bilaterally. GI: Deferred   MS: No edema; R leg bandaged   Neuro:  Nonfocal  Psych: Normal affect   Labs    High Sensitivity Troponin:  No results for input(s): TROPONINIHS in the last 720 hours.   Chemistry Recent Labs  Lab  01/02/22 1006 01/04/22 0552 01/05/22 0540  NA 137 137 137  K 4.3 4.2 3.9  CL 108 105 107  CO2 20* 23 23  GLUCOSE 121* 106* 110*  BUN 8 11 15   CREATININE 0.64 0.73 0.78  CALCIUM 9.0 9.1 8.8*  MG  --   --  2.2  PROT  --   --  5.7*  ALBUMIN  --   --  3.0*  AST  --   --  59*  ALT  --   --  107*  ALKPHOS  --   --  94  BILITOT  --   --  1.3*  GFRNONAA >60 >60 >60  ANIONGAP 9 9 7     Lipids No results for input(s): CHOL, TRIG, HDL, LABVLDL, LDLCALC, CHOLHDL in the last 168 hours.  Hematology Recent Labs  Lab 01/02/22 1006 01/04/22 0552 01/05/22 0540  WBC 7.6 7.7 8.7  RBC 3.86* 3.59* 3.45*  HGB 13.5 12.4 11.8*  HCT 37.0 34.9* 33.6*  MCV 95.9  97.2 97.4  MCH 35.0* 34.5* 34.2*  MCHC 36.5* 35.5 35.1  RDW 12.8 13.1 13.3  PLT 262 300 304   Thyroid No results for input(s): TSH, FREET4 in the last 168 hours.  BNPNo results for input(s): BNP, PROBNP in the last 168 hours.  DDimer No results for input(s): DDIMER in the last 168 hours.   Radiology    MR SHOULDER LEFT WO CONTRAST  Result Date: 01/07/2022 CLINICAL DATA:  Left shoulder pain.  Known left scapular fracture EXAM: MRI OF THE LEFT SHOULDER WITHOUT CONTRAST TECHNIQUE: Multiplanar, multisequence MR imaging of the shoulder was performed. No intravenous contrast was administered. COMPARISON:  X-ray 12/27/2021 FINDINGS: Rotator cuff: The supraspinatus, infraspinatus, subscapularis, and teres minor tendons are intact. Muscles: Prominent intramuscular edema within the subscapularis and infraspinatus muscles adjacent to the scapular fracture site as well as within the teres major muscle. Areas of distortion of the normal muscle architecture with small intramuscular fluid collections compatible with traumatic muscle tears and multiple small intramuscular hematomas. For reference, two subcentimeter intramuscular hematomas are seen within the infraspinatus muscle belly (series 5, images 4 and 9). No muscle atrophy or fatty infiltration.  Biceps long head: Intact and appropriately positioned. Small amount of fluid in the biceps tendon sheath. Acromioclavicular Joint: Mild-moderate degenerative changes of the AC joint. No significant subacromial-subdeltoid bursal fluid. Glenohumeral Joint: No joint effusion. No cartilage defect. Labrum: Grossly intact although evaluation is limited in the absence of intra-articular fluid. No paralabral cyst. Bones: Acute fracture involving the infraspinous aspect of the scapular body with approximately 1.0 cm of posterior displacement. No fracture involvement of the glenoid. Proximal humerus intact. Glenohumeral joint alignment is maintained without dislocation. No evidence of a underlying bone lesion. Other: Prominent soft tissue edema surrounding the fracture site. IMPRESSION: 1. Acute moderately displaced fracture of the left scapular body. 2. Prominent intramuscular edema surrounding the scapular fracture site. Associated traumatic muscle tears with multiple small intramuscular hematomas, most pronounced within the infraspinatus and subscapularis muscles. 3. Intact rotator cuff. Electronically Signed   By: Davina Poke D.O.   On: 01/07/2022 14:59    Cardiac Studies     Patient Profile    Terri Drake is a 61 y.o. female with a hx of PAF on flecainide and Toprol XL, had side effects w/ Dilt, CHA2DS2-VASc = 1 so not anticoagulated as of office note 10/2021, ?HTN, who is being seen 01/06/2022 for the evaluation of Afib at the request of Dr Ranell Patrick.  Assessment & Plan     1  PAF   t currently on flecanide 100 bid   Will get EKG   to evaluate QRS on this dose   QRS duration should be less than 110 msec.    Keep on low dose Toprol XL   2  Dizziness   I have reviewed notes from PT   BP does drop after exercise  It is much better than a few days ago when her BP was in the 60s at one point  I encouraged hydration and increased salt intake   An abdominal binder and TED hose have been added     Elevate head of bed which will affect fluid retention Again, should improve as she recovers   I would try ot avoid adding agents to increase.  Needs time.  Dorris Carnes MD      For questions or updates, please contact [facility] Please consult [email or web address] for contact info under        Signed, Dorris Carnes,  MD  01/08/2022, 2:16 PM

## 2022-01-09 DIAGNOSIS — R001 Bradycardia, unspecified: Secondary | ICD-10-CM

## 2022-01-09 DIAGNOSIS — I951 Orthostatic hypotension: Secondary | ICD-10-CM

## 2022-01-09 DIAGNOSIS — S82851D Displaced trimalleolar fracture of right lower leg, subsequent encounter for closed fracture with routine healing: Principal | ICD-10-CM

## 2022-01-09 LAB — COMPREHENSIVE METABOLIC PANEL
ALT: 49 U/L — ABNORMAL HIGH (ref 0–44)
AST: 27 U/L (ref 15–41)
Albumin: 3.3 g/dL — ABNORMAL LOW (ref 3.5–5.0)
Alkaline Phosphatase: 117 U/L (ref 38–126)
Anion gap: 7 (ref 5–15)
BUN: 12 mg/dL (ref 8–23)
CO2: 25 mmol/L (ref 22–32)
Calcium: 9 mg/dL (ref 8.9–10.3)
Chloride: 106 mmol/L (ref 98–111)
Creatinine, Ser: 0.75 mg/dL (ref 0.44–1.00)
GFR, Estimated: >60 mL/min (ref >60)
Glucose, Bld: 115 mg/dL — ABNORMAL HIGH (ref 70–99)
Potassium: 4.3 mmol/L (ref 3.5–5.1)
Sodium: 138 mmol/L (ref 135–145)
Total Bilirubin: 0.9 mg/dL (ref 0.3–1.2)
Total Protein: 6 g/dL — ABNORMAL LOW (ref 6.5–8.1)

## 2022-01-09 NOTE — Progress Notes (Signed)
Occupational Therapy Session Note  Patient Details  Name: Terri Drake MRN: [id number] Date of Birth: 1960/12/13  Today's Date: 01/09/2022 OT Individual Time: 1005-1100 OT Individual Time Calculation (min): 55 min    Short Term Goals: Week 1:  OT Short Term Goal 1 (Week 1): Patient will stand pivot transfer with platform walker to [facility] with min assist OT Short Term Goal 2 (Week 1): Patient will stand to pull up/down LB clothing with intermittent min assistance OT Short Term Goal 3 (Week 1): Patient will demonstrate 20* of shoulder flexion with LUE without report of increased pain  Skilled Therapeutic Interventions/Progress Updates:  Skilled OT intervention completed with focus on family education with pt's husband present, scapular fx/home management education, AAROM/AROM and proximal strengthening of LUE. Pt received seated in w/c, no initial c/o pain, agreeable to session. Pt and husband with questions about options for DME, shower transfers and hair washing tray. Education provided about [facility] recommendation, and shower chair for shower transfers once WB restrictions lifted, as pt's shower set up with hopping without RW is unsafe. Grab bar education, including vertical and horizontal placements. Provided handout for hair washing tray that can be purchased for home use.   Self-propelled with LLE and RUE in w/c with supervision <> gym. At table top, pt participated in the following to prep scapular region for AROM/proximal strengthening: (Table slides) 2x10 on LUE Shoulder flexion Shoulder external/internal Shoulder abduction/adduction Circular motion sweeping R>L then L>R  Completed 2x30 circles via golf ball on dycem rotating in small fast circles on table top to promote light proximal strengthening needed for AROM at the shoulder joint for ADL tasks, followed by AROM below 90 degrees shoulder flexion when stacking/removing 5 cones. Initial compensatory elbow flexion noted vs shoulder  AROM, with education provided about purpose of task and lifting lightly at shoulder with therapist providing elbow stabilization to off weight for pain management with pt able to transfer 2x5, however increased pain (unrated however pt tearful). Education provided about non-pharm management strategies, with offering ice for pain reduction and pt agreeable. Applied ice to L scapular region, and encouraged sling use PRN for off weighting shoulder if needed. Pt was left upright in w/c with chair alarm on and all needs in reach at end of session.   Therapy Documentation Precautions:  Precautions Precautions: Back, Fall Precaution Comments: orthostatic hypotension 5/15 Required Braces or Orthoses: Sling, Other Brace Other Brace: LSO for OOB, sling for comfort (allowed to move LUE) Restrictions Weight Bearing Restrictions: Yes LUE Weight Bearing: Weight bearing as tolerated RLE Weight Bearing: Non weight bearing    Therapy/Group: Individual Therapy  Severo Beber E Kashmir Lysaght 01/09/2022, 7:42 AM

## 2022-01-09 NOTE — Progress Notes (Signed)
Occupational Therapy Session Note  Patient Details  Name: Terri Drake MRN: [id number] Date of Birth: [birth year]  Today's Date: 01/09/2022 OT Individual Time: 0802-0830 OT Individual Time Calculation (min): 28 min    Short Term Goals: Week 1:  OT Short Term Goal 1 (Week 1): Patient will stand pivot transfer with platform walker to [facility] with min assist OT Short Term Goal 2 (Week 1): Patient will stand to pull up/down LB clothing with intermittent min assistance OT Short Term Goal 3 (Week 1): Patient will demonstrate 43* of shoulder flexion with LUE without report of increased pain  Skilled Therapeutic Interventions/Progress Updates:   Session 1:   Skilled OT intervention completed with a focus on bed mobility, functional transfers, and self-care to increase independence in ADL/IADLs. Pt met lying supine in bed and agreeable to OT session. Pt reports pain 7/10 at start of tx. Pt completed bed mobility (S) to EOB. Pt donned clothes in circle sit on bed min A with assistance for donning LLE. Pt transferred EOB to platform RW CGA. Pt transferred with a stand pivot transfer to w/c min A. OT assisted with w/c placement at sink for time management. Pt completed self care at the sink (S). Pt requested hair washing which will be completed in the afternoon session. Session concluded with pt seated in w/c with husband present and all needs met.    Therapy Documentation Precautions:  Precautions Precautions: Back, Fall Precaution Comments: orthostatic hypotension 5/15 Required Braces or Orthoses: Sling, Other Brace Other Brace: LSO for OOB, sling for comfort (allowed to move LUE) Restrictions Weight Bearing Restrictions: Yes LUE Weight Bearing: Weight bearing as tolerated RLE Weight Bearing: Non weight bearing   ADL: ADL Eating: Independent Where Assessed-Eating: Chair Grooming: Minimal assistance Where Assessed-Grooming: Sitting at sink Upper Body Bathing: Minimal assistance Where  Assessed-Upper Body Bathing: Sitting at sink Lower Body Bathing: Moderate assistance Where Assessed-Lower Body Bathing: Sitting at sink Upper Body Dressing: Minimal assistance Where Assessed-Upper Body Dressing: Chair Lower Body Dressing: Moderate assistance Where Assessed-Lower Body Dressing: Chair Toileting: Minimal assistance Where Assessed-Toileting: Bedside Commode Toilet Transfer: Moderate assistance (monitor BP - orthostatic) Toilet Transfer Method: Stand pivot Toilet Transfer Equipment: Bedside commode (PFRW) Tub/Shower Transfer: Other (comment), Not assessed (Unable to shower per MD)   Therapy/Group: Individual Therapy  [name] 01/09/2022, 9:58 AM

## 2022-01-09 NOTE — Progress Notes (Signed)
Physical Therapy Session Note  Patient Details  Name: Terri Drake MRN: [id number] Date of Birth: Dec 05, 1960  Today's Date: 01/09/2022 PT Individual Time: 0830-0927 PT Individual Time Calculation (min): 57 min   Short Term Goals: Week 1:  PT Short Term Goal 1 (Week 1): pt will transfer bed<>chair with LRAD and supervision PT Short Term Goal 2 (Week 1): pt will ambulate 21ft with LRAD and min A PT Short Term Goal 3 (Week 1): pt will perform simulated car transfer with LRAD and CGA  Skilled Therapeutic Interventions/Progress Updates:   Received pt sitting in [facility] with husband, Tim present at bedside. Pt agreeable to PT treatment and reported pain 6/10 in L shoulder (pt declined any pain medications). Session with emphasis on family education training, functional mobility/transfers, generalized strengthening and endurance, dynamic standing balance/coordination, and simulated car transfers. Pt requested Tim to be checked off for toilet transfers. Set up simulated toilet transfer and pt transferred WC<>bedside commode with L PFRW and CGA provided by therapist and transferred bedside commode<>WC with L PFRW and CGA provided by Tim - educated Tim on body positioning and set up of transfers, as well as importance of using gait belt. Also educated Tim on [employer] parts [employer]ud[employer] - after education, Jorja Loa able to perform with supervision and increased time. Pt/Tim with questions regarding navigating 3 steps at home - educated on recommendation for ramp and notified CSW to provide ramp resources. Pt/Tim reported having small 4in threshold to get into house and agreeable to practicing bumping up/down curb. Pt performed WC mobility 167ft using BUE and LLE to main therapy gym and bumped pt up/down 1 4in curb x 2 trials, trial 1 with therapist and trial 2 with Tim - cues for technique and control. Pt then performed WC mobility additional 51ft using BUE and LLE to ortho gym and performed  simulated car transfer with L PFRW with CGA to enter (assist provided by therapist) and min A to exit (provided by husband, Tim) due to 1 posterior LOB, but Tim demonstrating great safety techniques and reaction time. Performed WC mobility >249ft using BUE and LLE back to room and re-checked BP - donned L ted hose due to low BP, although pt denied symptoms. Concluded session with pt sitting in WC, needs within reach, and chair pad alarm on. Cleared Tim to assist with toilet transfers, updated safety plan, and notified NT.   Orthostatics:  Sitting at rest: 96/77 Sitting at end of session: 124/47 trial 1 and 111/58 trial 2  Therapy Documentation Precautions:  Precautions Precautions: Back, Fall Precaution Comments: orthostatic hypotension 5/15 Required Braces or Orthoses: Sling, Other Brace Other Brace: LSO for OOB, sling for comfort (allowed to move LUE) Restrictions Weight Bearing Restrictions: Yes LUE Weight Bearing: Weight bearing as tolerated RLE Weight Bearing: Non weight bearing  Therapy/Group: Individual Therapy Martin Majestic PT, DPT  01/09/2022, 6:50 AM

## 2022-01-09 NOTE — Progress Notes (Signed)
Progress Note  Patient Name: Terri Drake Date of Encounter: 01/09/2022  [facility] Cardiologist: Freada Bergeron, MD   Subjective   Patient reports that during rehab, her BP occasionally gets lower and she will feel dizzy. However, she has improved significantly compared to last week when her BP dropped to the 60s at one point. She is able to do most of her exercises with PT and OT, and is usually limited by pain rather than her low BP   Inpatient Medications    Scheduled Meds:  vitamin C  500 mg Oral Daily   bacitracin   Topical BID   bethanechol  5 mg Oral Daily   enoxaparin (LOVENOX) injection  30 mg Subcutaneous Q12H   flecainide  100 mg Oral Q12H   latanoprost  1 drop Both Eyes QHS   lidocaine  1 patch Transdermal Q24H   magnesium gluconate  250 mg Oral QHS   metoprolol succinate  25 mg Oral QHS   Vitamin D (Ergocalciferol)  50,000 Units Oral Q7 days   zinc sulfate  220 mg Oral Daily   Continuous Infusions:  PRN Meds: acetaminophen, magnesium hydroxide, methocarbamol, metoprolol tartrate, oxyCODONE   Vital Signs    Vitals:   01/08/22 1339 01/08/22 1720 01/08/22 2003 01/09/22 0528  BP: (!) 128/59 (!) 144/58 (!) 119/50 124/62  Pulse: 68 74 71 (!) 59  Resp:   18 18  Temp:   97.9 F (36.6 C) (!) 97.4 F (36.3 C)  TempSrc:      SpO2:   97% 99%  Weight:      Height:        Intake/Output Summary (Last 24 hours) at 01/09/2022 0947 Last data filed at 01/08/2022 1326 Gross per 24 hour  Intake 240 ml  Output --  Net 240 ml      01/04/2022    2:01 PM 12/27/2021    5:48 PM 11/04/2021    8:31 AM  Last 3 Weights  Weight (lbs) 195 lb 1.7 oz 191 lb 194 lb 9.6 oz  Weight (kg) 88.5 kg 86.637 kg 88.27 kg      Telemetry    NA - Personally Reviewed  ECG    Sinus rhythm, HR 72 BPM, QT/QTcB 438/479, QRS 84 ms - Personally Reviewed  Physical Exam   GEN: No acute distress. Sitting comfortably in her wheel chair   Neck: No JVD Cardiac: RRR, no murmurs,  rubs, or gallops.  Respiratory: Clear to auscultation bilaterally. GI: Soft, nontender, non-distended  MS: Wearing a splint/bandages on right leg. No edema in left lower extremity  Neuro:  Nonfocal  Psych: Normal affect   Labs    High Sensitivity Troponin:  No results for input(s): TROPONINIHS in the last 720 hours.   Chemistry Recent Labs  Lab 01/04/22 0552 01/05/22 0540 01/09/22 0654  NA 137 137 138  K 4.2 3.9 4.3  CL 105 107 106  CO2 23 23 25   GLUCOSE 106* 110* 115*  BUN 11 15 12   CREATININE 0.73 0.78 0.75  CALCIUM 9.1 8.8* 9.0  MG  --  2.2  --   PROT  --  5.7* 6.0*  ALBUMIN  --  3.0* 3.3*  AST  --  59* 27  ALT  --  107* 49*  ALKPHOS  --  94 117  BILITOT  --  1.3* 0.9  GFRNONAA >60 >60 >60  ANIONGAP 9 7 7     Lipids No results for input(s): CHOL, TRIG, HDL, LABVLDL, LDLCALC, CHOLHDL  in the last 168 hours.  Hematology Recent Labs  Lab 01/02/22 1006 01/04/22 0552 01/05/22 0540  WBC 7.6 7.7 8.7  RBC 3.86* 3.59* 3.45*  HGB 13.5 12.4 11.8*  HCT 37.0 34.9* 33.6*  MCV 95.9 97.2 97.4  MCH 35.0* 34.5* 34.2*  MCHC 36.5* 35.5 35.1  RDW 12.8 13.1 13.3  PLT 262 300 304   Thyroid No results for input(s): TSH, FREET4 in the last 168 hours.  BNPNo results for input(s): BNP, PROBNP in the last 168 hours.  DDimer No results for input(s): DDIMER in the last 168 hours.   Radiology    No results found.  Cardiac Studies   Echocardiogram 01/02/22    1. Left ventricular ejection fraction, by estimation, is 65 to 70%. The  left ventricle has hyperdynamic function. The left ventricle has no  regional wall motion abnormalities. Left ventricular diastolic parameters  are indeterminate.   2. Right ventricular systolic function is normal. The right ventricular  size is normal. Tricuspid regurgitation signal is inadequate for assessing  PA pressure.   3. The mitral valve is normal in structure. No evidence of mitral valve  regurgitation. No evidence of mitral stenosis.   4.  The aortic valve is normal in structure. Aortic valve regurgitation is  not visualized. No aortic stenosis is present.   5. The inferior vena cava is normal in size with greater than 50%  respiratory variability, suggesting right atrial pressure of 3 mmHg.   Patient Profile     61 y.o. female with a hx of PAF on flecainide and Toprol XL, had side effects w/ Dilt, CHA2DS2-VASc = 1 so not anticoagulated as of office note 10/2021, ?HTN, who was seen 01/06/2022 for the evaluation of Afib and hypotension at the request of Dr Ranell Patrick  Assessment & Plan    Paroxysmal Atrial Fibrillation  - Patient currently on flecainide 100 BID (increased from home dose of 50 mid), metoprolol 25 mg daily  - EKG 5/21 showed sinus rhythm, QRS duration 84 ms, QT/QTcB 438/479 ms - Not on AC due to low CHADS-VASc  - Low threshold to send to ablation if afib reoccurs   Dizziness  Hypotension  - Improving  - Continue metoprolol 25 mg daily (reduced dose from 50 mg BID earlier this admission)  - Continue to wear abdominal binder and TED hose  - Encouraged hydration and increased salt intake, elevated head of bed  - I suspect this will continue to improve as patient recovers from her trauma and works with physical/occupational therapy  - Discussed monitoring BP at home after discharge   Otherwise per primary  - Trauma due to tree branch falling on patient  - Rib fractures, scapular fracture, right ankle fracture s/p ORIF - Pneumothorax (resolved as of 5/15)         For questions or updates, please contact [place] Please consult [email or web address] for contact info under        Signed, Margie Billet, PA-C  01/09/2022, 9:47 AM

## 2022-01-09 NOTE — Progress Notes (Signed)
PROGRESS NOTE   Subjective/Complaints: In chair this AM. No new concerns or complaints.  ROS: +dizziness with low blood pressure, +left sided scapular pain, voiding well, NO SOB, fevers or chiills   Objective:   No results found. No results for input(s): WBC, HGB, HCT, PLT in the last 72 hours.  No results for input(s): NA, K, CL, CO2, GLUCOSE, BUN, CREATININE, CALCIUM in the last 72 hours.   Intake/Output Summary (Last 24 hours) at 01/09/2022 0730 Last data filed at 01/08/2022 1326 Gross per 24 hour  Intake 240 ml  Output --  Net 240 ml         Physical Exam: Vital Signs Blood pressure (!) 106/56, pulse 72, temperature 98.3 F (36.8 C), resp. rate 18, height 5\' 7"  (1.702 m), weight 88.5 kg, SpO2 98 %. Gen: no distress, normal appearing, BMI 30.56 HEENT: oral mucosa pink and moist, NCAT Cardio: Regular heart rate, no MRG Chest: normal effort, normal rate of breathing Abd: soft, non-distended Ext: no edema Psych: pleasant, normal affect Skin: splint in place to right lower extremity. Bruising of left arm. Would care dressing to left knee abraison Neuro: Alert x3 and follows commands Sit to stand MinA   Assessment/Plan: 1. Functional deficits which require 3+ hours per day of interdisciplinary therapy in a comprehensive inpatient rehab setting. Physiatrist is providing close team supervision and 24 hour management of active medical problems listed below. Physiatrist and rehab team continue to assess barriers to discharge/monitor patient progress toward functional and medical goals  Care Tool:  Bathing    Body parts bathed by patient: Right arm, Left arm, Chest, Abdomen, Front perineal area, Right upper leg, Left upper leg, Left lower leg, Face   Body parts bathed by helper: Buttocks     Bathing assist Assist Level: Minimal Assistance - Patient > 75%     Upper Body Dressing/Undressing Upper body dressing    What is the patient wearing?: Button up shirt    Upper body assist Assist Level: Minimal Assistance - Patient > 75%    Lower Body Dressing/Undressing Lower body dressing      What is the patient wearing?: Underwear/pull up, Pants     Lower body assist Assist for lower body dressing: Moderate Assistance - Patient 50 - 74%     Toileting Toileting    Toileting assist Assist for toileting: Minimal Assistance - Patient > 75%     Transfers Chair/bed transfer  Transfers assist     Chair/bed transfer assist level: Minimal Assistance - Patient > 75%     Locomotion Ambulation   Ambulation assist   Ambulation activity did not occur: Safety/medical concerns  Assist level: 2 helpers Assistive device: Walker-platform Max distance: 48ft   Walk 10 feet activity   Assist  Walk 10 feet activity did not occur: Safety/medical concerns (low BP, decreased balance, pain)        Walk 50 feet activity   Assist Walk 50 feet with 2 turns activity did not occur: Safety/medical concerns (low BP, decreased balance, pain)         Walk 150 feet activity   Assist Walk 150 feet activity did not occur: Safety/medical concerns (low BP, decreased  balance, pain)         Walk 10 feet on uneven surface  activity   Assist Walk 10 feet on uneven surfaces activity did not occur: Safety/medical concerns (low BP, decreased balance, pain)         Wheelchair     Assist Is the patient using a wheelchair?: Yes Type of Wheelchair: Manual Wheelchair activity did not occur: Safety/medical concerns (low BP)  Wheelchair assist level: Supervision/Verbal cueing Max wheelchair distance: 113ft    Wheelchair 50 feet with 2 turns activity    Assist    Wheelchair 50 feet with 2 turns activity did not occur: Safety/medical concerns (low BP)   Assist Level: Supervision/Verbal cueing   Wheelchair 150 feet activity     Assist  Wheelchair 150 feet activity did not occur:  Safety/medical concerns (low BP)       Blood pressure (!) 106/56, pulse 72, temperature 98.3 F (36.8 C), resp. rate 18, height 5\' 7"  (1.702 m), weight 88.5 kg, SpO2 98 %.  Medical Problem List and Plan: 1. Functional deficits secondary to closed right trimalleolar ankle fracture dislocation status post ORIF and closed left scapular body fracture nonoperative with conservative care.  Nonweightbearing right lower extremity x8 weeks with splint x2 weeks then convert to CAM for range of motion exercises.  Weightbearing as tolerated left upper extremity with sling for comfort.             -patient may not shower             -ELOS/Goals: 10-14 days modI          Continue CIR PT and OT 2.  Antithrombotics: -DVT/anticoagulation:  Pharmaceutical: Lovenox.  Check vascular study.  Convert to Xarelto 10 mg daily x30 days at discharge.             -antiplatelet therapy: N/A 3. Pain: continue Lidoderm patch, Robaxin 1000 mg 4 times daily, oxycodone as needed. Discussed amitriptyline at night but she refuses given potential side effects 4. Insomnia: Continue 5-10mg  oxycodone HS prn to help her sleep 5. Neuropsych: This patient is capable of making decisions on her own behalf. 6. Skin/Wound Care: Routine skin checks 7. Fluids/Electrolytes/Nutrition: Routine in and outs with follow-up chemistries 8.  T11/12 endplate fracture.  Lumbar corset as directed per Dr. Kathyrn Sheriff 9.  Left rib fractures 2 and 6.  Conservative care 10.  Urinary retention.  Decrease urecholine to 5mg  daily.   11.  Atrial fibrillation with RVR.  Follow-up cardiology services.  Flecainide 100 mg every 12 hours, Lopressor 37.5 mg twice daily. Start magnesium gluconate 250mg  HS.   -Followed by cardiology, metoprolol now 25 daily 12.  Constipation.  MiraLAX daily, Colace 100 mg twice daily. Start magnesium gluconate 250mg  HS. Check magnesium level tomorrow morning.  13. Vitamin D deficiency: Level is 17: started ergocalciferol 50,000U once  per week every 7 weeks on 5/17.  14. Transaminitis: Tylenol changed to PRN, repeat CMP Monday.  15. Hypotension: abdominal binder and TEDs ordered for therapy- did not need today since AM afib meds were held.  16. Bradycardia: Toprol changed to 25mg  XL HS.   -5/22 Pulse last check 69, stable continue to follow 17. Left scapular fracture with worsening pain: MRI ordered and results discussed with patient 18. Hypotension: improved, continue abdominal binder and TEDs.   -Cardiology recommending increased salt intake, continue to monitor    LOS: 5 days A FACE TO FACE EVALUATION WAS PERFORMED  Jennye Boroughs 01/09/2022, 7:30 AM

## 2022-01-09 NOTE — Progress Notes (Signed)
Occupational Therapy Session Note  Patient Details  Name: Terri Drake MRN: [id number] Date of Birth: February 22, 1961  Today's Date: 01/09/2022 OT Individual Time: 1403-1503 OT Individual Time Calculation (min): 60 min    Short Term Goals: Week 1:  OT Short Term Goal 1 (Week 1): Patient will stand pivot transfer with platform walker to [facility] with min assist OT Short Term Goal 2 (Week 1): Patient will stand to pull up/down LB clothing with intermittent min assistance OT Short Term Goal 3 (Week 1): Patient will demonstrate 60* of shoulder flexion with LUE without report of increased pain  Skilled Therapeutic Interventions/Progress Updates:    Pt received seated in wc and agreeable to OT intervention. No c/o of pain at start of tx session. Skilled OT intervention completed focusing on BADLS including showering and UB/LB dressing to increase independence in ADL/IADLs. BP taken prior to tx 115/55. Pt doffed clothing with min A.Pt demonstrated functional transfers from w/c to shower chair with min A and use of grab bars.  Therapist provided reminders for adherence to WB of LUE/RLE. Pt showered with mostly (S) from therapist, but min A to wash buttocks using lateral leans. Water kept lukewarm water to prevent orthostatic hypotension. Therapist monitored symptoms throughout tx. Pt BP post shower 123/69. Pt donned shirt (S). Pt donned undergarments and pants using hemi technique and AD for ease. Pt demonstrated proper use of equipment. Pt stood wc > edge of sink with Min A for standing and min A for pulling pants up over hips on L side. Pt left seated in chair with call bell in reach and chair alarm set.   Therapy Documentation Precautions:  Precautions Precautions: Back, Fall Precaution Comments: orthostatic hypotension 5/15 Required Braces or Orthoses: Sling, Other Brace Other Brace: LSO for OOB, sling for comfort (allowed to move LUE) Restrictions Weight Bearing Restrictions: Yes LUE Weight Bearing:  Weight bearing as tolerated RLE Weight Bearing: Non weight bearing Pain:  Patient reported pain controlled   Therapy/Group: Individual Therapy  [name] 01/09/2022, 3:14 PM

## 2022-01-10 ENCOUNTER — Other Ambulatory Visit (HOSPITAL_COMMUNITY): Payer: Self-pay

## 2022-01-10 LAB — TSH: TSH: 1.076 u[IU]/mL (ref 0.350–4.500)

## 2022-01-10 LAB — HEMOGLOBIN A1C
Hgb A1c MFr Bld: 5 % (ref 4.8–5.6)
Mean Plasma Glucose: 96.8 mg/dL

## 2022-01-10 LAB — T4, FREE: Free T4: 1.44 ng/dL — ABNORMAL HIGH (ref 0.61–1.12)

## 2022-01-10 MED ORDER — RIVAROXABAN 10 MG PO TABS
10.0000 mg | ORAL_TABLET | Freq: Every day | ORAL | Status: DC
  Administered 2022-01-11 – 2022-01-18 (×8): 10 mg via ORAL
  Filled 2022-01-10 (×8): qty 1

## 2022-01-10 MED ORDER — ENOXAPARIN SODIUM 30 MG/0.3ML IJ SOSY
30.0000 mg | PREFILLED_SYRINGE | Freq: Once | INTRAMUSCULAR | Status: AC
  Administered 2022-01-10: 30 mg via SUBCUTANEOUS
  Filled 2022-01-10: qty 0.3

## 2022-01-10 NOTE — Progress Notes (Signed)
Patient ID: Terri Drake, female   DOB: Nov 06, 1960, 61 y.o.   MRN: [id number]  Progress Notes, Dme and Ramp orders faxed to patient CM: Beth at [phone]

## 2022-01-10 NOTE — Progress Notes (Signed)
PROGRESS NOTE   Subjective/Complaints: Appreciate SW following up with Terri Drake Drake Appreciate pharmacy assistance with Terri Drake copay  ROS: +dizziness with low blood pressure, +left sided scapular pain, voiding well, denies SOB, fevers or chiills   Objective:   No results found. No results for input(s): WBC, HGB, HCT, PLT in the last 72 hours.  Recent Labs    01/09/22 0654  NA 138  K 4.3  CL 106  CO2 25  GLUCOSE 115*  BUN 12  CREATININE 0.75  CALCIUM 9.0     Intake/Output Summary (Last 24 hours) at 01/10/2022 1050 Last data filed at 01/10/2022 0900 Gross per 24 hour  Intake 480 ml  Output --  Net 480 ml        Physical Exam: Vital Signs Blood pressure 130/60, pulse (!) 58, temperature 98.2 F (36.8 C), temperature source Oral, resp. rate 15, height 5\' 7"  (1.702 m), weight 88.5 kg, SpO2 97 %. Gen: no distress, normal appearing, BMI 30.56 HEENT: oral mucosa pink and moist, NCAT Cardio: Bradycardic Chest: normal effort, normal rate of breathing Abd: soft, non-distended Ext: no edema Psych: pleasant, normal affect Skin: splint in place to right lower extremity. Bruising of left arm. Would care dressing to left knee abraison Neuro: Alert x3 and follows commands Sit to stand MinA   Assessment/Plan: 1. Functional deficits which require 3+ hours per day of interdisciplinary therapy in a comprehensive inpatient rehab setting. Physiatrist is providing close team supervision and 24 hour management of active medical problems listed below. Physiatrist and rehab team continue to assess barriers to discharge/monitor patient progress toward functional and medical goals  Care Tool:  Bathing    Body parts bathed by patient: Right arm, Left arm, Chest, Abdomen, Front perineal area, Right upper leg, Left upper leg, Right lower leg, Left lower leg, Face   Body parts bathed by helper: Buttocks      Bathing assist Assist Level: Minimal Assistance - Patient > 75%     Upper Body Dressing/Undressing Upper body dressing   What is the patient wearing?: Button up shirt    Upper body assist Assist Level: Set up assist    Lower Body Dressing/Undressing Lower body dressing      What is the patient wearing?: Underwear/pull up, Pants     Lower body assist Assist for lower body dressing: Minimal Assistance - Patient > 75%     Toileting Toileting    Toileting assist Assist for toileting: Minimal Assistance - Patient > 75%     Transfers Chair/bed transfer  Transfers assist     Chair/bed transfer assist level: Contact Guard/Touching assist     Locomotion Ambulation   Ambulation assist   Ambulation activity did not occur: Safety/medical concerns  Assist level: Minimal Assistance - Patient > 75% Assistive device: Walker-platform Max distance: 22ft   Walk 10 feet activity   Assist  Walk 10 feet activity did not occur: Safety/medical concerns (low BP, decreased balance, pain)  Assist level: Minimal Assistance - Patient > 75% Assistive device: Walker-rolling   Walk 50 feet activity   Assist Walk 50 feet with 2 turns activity did not occur: Safety/medical concerns (low BP, decreased balance, pain)  Walk 150 feet activity   Assist Walk 150 feet activity did not occur: Safety/medical concerns (low BP, decreased balance, pain)         Walk 10 feet on uneven surface  activity   Assist Walk 10 feet on uneven surfaces activity did not occur: Safety/medical concerns (low BP, decreased balance, pain)         Wheelchair     Assist Is the patient using a wheelchair?: Yes Type of Wheelchair: Manual Wheelchair activity did not occur: Safety/medical concerns (low BP)  Wheelchair assist level: Supervision/Verbal cueing Max wheelchair distance: 125ft    Wheelchair 50 feet with 2 turns activity    Assist    Wheelchair 50 feet with 2  turns activity did not occur: Safety/medical concerns (low BP)   Assist Level: Supervision/Verbal cueing   Wheelchair 150 feet activity     Assist  Wheelchair 150 feet activity did not occur: Safety/medical concerns (low BP)       Blood pressure 130/60, pulse (!) 58, temperature 98.2 F (36.8 C), temperature source Oral, resp. rate 15, height 5\' 7"  (1.702 m), weight 88.5 kg, SpO2 97 %.  Medical Problem List and Plan: 1. Functional deficits secondary to closed right trimalleolar ankle fracture dislocation status post ORIF and closed left scapular body fracture nonoperative with conservative care.  Nonweightbearing right lower extremity x8 weeks with splint x2 weeks then convert to CAM for range of motion exercises.  Weightbearing as tolerated left upper extremity with sling for comfort.             -patient may not shower             -ELOS/Goals: 10-14 days modI          Continue CIR PT and OT 2.  Antithrombotics: -DVT/anticoagulation:  Pharmaceutical: Terri Drake.  Check vascular study.  Convert to Terri Drake 10 mg daily x30 days now to minimize injections.              -antiplatelet therapy: N/A 3. Pain: continue Terri Drake patch, Terri Drake 1000 mg 4 times daily, oxycodone as needed. Discussed Terri Drake at night but she refuses given potential side effects 4. Insomnia: Continue 5-10mg  oxycodone HS prn to help her sleep 5. Neuropsych: This patient is capable of making decisions on her own behalf. 6. Skin/Wound Care: Routine skin checks 7. Fluids/Electrolytes/Nutrition: Routine in and outs with follow-up chemistries 8.  T11/12 endplate fracture.  Lumbar corset as directed per Terri Drake 9.  Left rib fractures 2 and 6.  Conservative care 10.  Urinary retention.  Discontinue Terri Drake.  11.  Atrial fibrillation with RVR.  Follow-up cardiology services.  Terri Drake 100 mg every 12 hours, Terri Drake 37.5 mg twice daily. Start Terri Drake 250mg  HS.   -Followed by cardiology,  Terri Drake now 25 daily  Given bradycardia, will discuss with patient whether she wants to decrease Terri Drake dose to 12.5mg  12.  Constipation.  Terri Drake daily, Terri Drake 100 mg twice daily. Start Terri Drake 250mg  HS. Check Terri level tomorrow morning.  13. Vitamin D deficiency: Level is 17: started Terri Drake 50,000U once per week every 7 weeks on 5/17.  14. Transaminitis: Tylenol changed to PRN, repeat CMP Monday.  15. Hypotension: abdominal binder and TEDs ordered for therapy- did not need today since AM afib meds were held.  16. Bradycardia: Terri Drake changed to 25mg  XL HS.   -5/22 Pulse last check 69, stable continue to follow 17. Left scapular fracture with worsening pain: MRI ordered and results discussed with patient 18. Hypotension: improved, continue  abdominal binder and TEDs.   -Cardiology recommending increased salt intake, continue to monitor    LOS: 6 days A FACE TO FACE EVALUATION WAS PERFORMED  Sandi Towe P Cayleigh Paull 01/10/2022, 10:50 AM

## 2022-01-10 NOTE — Progress Notes (Signed)
Occupational Therapy Session Note  Patient Details  Name: Terri Drake MRN: [id number] Date of Birth: 07-08-1961  Today's Date: 01/10/2022 OT Individual Time: 1105-1200 OT Individual Time Calculation (min): 55 min    Short Term Goals: Week 1:  OT Short Term Goal 1 (Week 1): Patient will stand pivot transfer with platform walker to [facility] with min assist OT Short Term Goal 2 (Week 1): Patient will stand to pull up/down LB clothing with intermittent min assistance OT Short Term Goal 3 (Week 1): Patient will demonstrate 26* of shoulder flexion with LUE without report of increased pain  Skilled Therapeutic Interventions/Progress Updates:  Skilled OT intervention completed with focus on LUE AAROM/AROM, family education. Pt received seated in w/c, no initial c/o pain, agreeable to session. Pt's husband present, with questions about how to secure ramp with workers comp, with CSW notified to visit pt's husband per request.   Self-propelled in w/c with mod I <> gym. Seated at table top, pt completed the following to promote AAROM/AROM with LUE needed for ADL tasks:  (Table slides) 2x10 on LUE Shoulder flexion Shoulder external/internal Shoulder abduction/adduction Circular motion sweeping R>L then L>R  Progressed task per pain free gliding of scapula to elevated incline for 4x10 shoulder flexion slides with pt initially using R hand over L to off weight arm for 2x10 then without R hand assist for 2x10.  Stacking/un-stacking 10 cones from L <>R 2x10, with improved tolerance after cues provided about \\lifting  at shoulder then elbow flexion when pain is intolerable as pt still demonstrates guarding at scapular region.   Upgraded task by having pt complete peg board to follow design/stack/remove 4 layers of pegs with LUE with minimal pain reported.   Overall only tolerating about 60 degrees AROM shoulder flexion this session however still progression from under 45 degrees at eval. Pt was left  upright in w/c, ice applied to L scapula, with chair alarm on and all needs in reach at end of session.   Therapy Documentation Precautions:  Precautions Precautions: Back, Fall Precaution Comments: orthostatic hypotension 5/15 Required Braces or Orthoses: Sling, Other Brace Other Brace: LSO for OOB, sling for comfort (allowed to move LUE) Restrictions Weight Bearing Restrictions: Yes LUE Weight Bearing: Weight bearing as tolerated RLE Weight Bearing: Non weight bearing    Therapy/Group: Individual Therapy  [name] E Cloie Wooden 01/10/2022, 7:52 AM

## 2022-01-10 NOTE — TOC Benefit Eligibility Note (Signed)
Patient Teacher, English as a foreign language completed.    The patient is currently admitted and upon discharge could be taking Xarelto 10 mg.  The current 30 day co-pay is, $30.00.   The patient is insured through [place], [place] Patient [place] Patient Advocate Team Direct Number: [phone]  Fax: [phone]

## 2022-01-10 NOTE — Progress Notes (Signed)
Physical Therapy Session Note  Patient Details  Name: Terri Drake MRN: [id number] Date of Birth: 16-Feb-1961  Today's Date: 01/10/2022 PT Individual Time: 0762-2633 and 3545-6256 PT Individual Time Calculation (min): 71 min and 27 min  Short Term Goals: Week 1:  PT Short Term Goal 1 (Week 1): pt will transfer bed<>chair with LRAD and supervision PT Short Term Goal 2 (Week 1): pt will ambulate 73ft with LRAD and min A PT Short Term Goal 3 (Week 1): pt will perform simulated car transfer with LRAD and CGA  Skilled Therapeutic Interventions/Progress Updates:   Treatment Session 1 Received pt sitting in WC, pt agreeable to PT treatment, and reported pain in L shoulder (unrated) but stated it was "tolerable" and declined any pain medications. Session with emphasis on functional mobility/transfers, generalized strengthening and endurance, dynamic standing balance/coordination, and gait training. Pt performed WC mobility 139ft x 2 trials using BUE and LLE to/from dayroom and took measurements to fit pt for 18x18 manual WC - notified CSW. Sit<>stand with LPFRW and CGA and pt ambulated 16ft x 2 and 83ft x 1 with L PFRW and CGA with close WC follow - cues to increase L step height and to land softly to avoid excessive strain on L knee. Pt transferred on/off Nustep with L PFRW and CGA and performed seated BUE and LLE strengthening on Nustep at workload 2 for 5 minutes for a total of 209 steps with emphasis on cardiovascular endurance and LUE strength/ROM. Pt then performed seated LLE strengthening on Kinetron at 10 cm/sec for 1 minute x 4 trials with therapist providing manual counter resistance with emphasis on glute/quad strength. CSW arrived and provided pt with ramp resources. Concluded session with pt sitting in WC, needs within reach, and chair pad alarm on.   Orthostatics - L ted hose donned and tight yoga pants around abdomen Sitting at rest: 117/47 Sitting after ambulating trial 1: 92/38 - dizzy;  reassessed BP 102/62 Sitting after ambulating trial 2: 97/50 - dizzy; reassessed BP 106/54 Sitting after ambulating trial 3: 84/49 - dizzy; reassessed BP 93/50 Sitting after 5 minutes on Nustep: 112/51  Treatment Session 2 Received pt sitting in WC, pt agreeable to PT treatment, and reported pain 3/10 in L shoulder - declined any pain medications. Session with emphasis on functional mobility/transfers, generalized strengthening and endurance, and gait training. Pt performed WC mobility 155ft x 2 trials using BUE and LLE to/from dayroom. Sit<>stand with L PFRW and CGA and pt ambulated 54ft x 1, 66ft x 1, and 4ft x 1 with L PFRW and CGA. Pt performed x5 LLE "hops" in place with emphasis on increasing height. Concluded session with pt sitting in WC, needs within reach, and chair pad alarm on. Provided pt with ice pack for L shoulder   Orthostatics: Sitting: 123/73 Sitting after ambulating trial 1: 114/66 Sitting after ambulating trial 2: 133/62 Sitting after ambulating trial 3: 124/64  Therapy Documentation Precautions:  Precautions Precautions: Back, Fall Precaution Comments: orthostatic hypotension 5/15 Required Braces or Orthoses: Sling, Other Brace Other Brace: LSO for OOB, sling for comfort (allowed to move LUE) Restrictions Weight Bearing Restrictions: Yes LUE Weight Bearing: Weight bearing as tolerated RLE Weight Bearing: Non weight bearing  Therapy/Group: Individual Therapy Martin Majestic PT, DPT  01/10/2022, 6:56 AM

## 2022-01-10 NOTE — Progress Notes (Signed)
   Continue with flecainide 100 mg twice a day.  Metoprolol 25 mg a day.  If atrial fibrillation returns, consider ablative therapy.  We will go ahead and sign off.  Please let us know if we can be of further assistance.  Candee Furbish, MD

## 2022-01-10 NOTE — Progress Notes (Signed)
Patient ID: Terri Drake, female   DOB: 1961-05-05, 61 y.o.   MRN: [id number]  Sw left VM for patient worker comp SM, sw will wait for follow up.

## 2022-01-10 NOTE — Progress Notes (Signed)
Occupational Therapy Session Note  Patient Details  Name: Terri Drake MRN: [id number] Date of Birth: 09/15/1960  Today's Date: 01/10/2022 OT Individual Time: 0700-0745 OT Individual Time Calculation (min): 45 min    Short Term Goals: Week 1:  OT Short Term Goal 1 (Week 1): Patient will stand pivot transfer with platform walker to [facility] with min assist OT Short Term Goal 2 (Week 1): Patient will stand to pull up/down LB clothing with intermittent min assistance OT Short Term Goal 3 (Week 1): Patient will demonstrate 30* of shoulder flexion with LUE without report of increased pain  Skilled Therapeutic Interventions/Progress Updates:     Pt received in bed with 'some' pain but declined intervention  ADL: Pt completes ADL at overall CGA-set up Level. Skilled interventions include: Pt provided with reacher to improve access to feet but no cuing required throughout session for use to don LB clothing. Pt requires A for teds and cuing for hand placement during transfer onto [place] for NWB precautions. Pt requires A to don ted sock and min A to bring corset around back prior to transitional movements. Discussed AROM therex for improved ROM at shoulder to pain tolerance  BP assessed after BADL at sink/BSC: seated with 1 compression sock on and tight yoga pants to improve abdominal compression.121/61   Pt left at end of session in w/c with exit alarm on, call light in reach and all needs met   Therapy Documentation Precautions:  Precautions Precautions: Back, Fall Precaution Comments: orthostatic hypotension 5/15 Required Braces or Orthoses: Sling, Other Brace Other Brace: LSO for OOB, sling for comfort (allowed to move LUE) Restrictions Weight Bearing Restrictions: Yes LUE Weight Bearing: Weight bearing as tolerated RLE Weight Bearing: Non weight bearing General:   Vital Signs: Therapy Vitals Temp: 98.2 F (36.8 C) Temp Source: Oral Pulse Rate: (!) 58 Resp: 15 BP:  130/60 Patient Position (if appropriate): Lying Oxygen Therapy SpO2: 97 % O2 Device: Room Air Pain:   ADL: ADL Eating: Independent Where Assessed-Eating: Chair Grooming: Minimal assistance Where Assessed-Grooming: Sitting at sink Upper Body Bathing: Minimal assistance Where Assessed-Upper Body Bathing: Sitting at sink Lower Body Bathing: Moderate assistance Where Assessed-Lower Body Bathing: Sitting at sink Upper Body Dressing: Minimal assistance Where Assessed-Upper Body Dressing: Chair Lower Body Dressing: Moderate assistance Where Assessed-Lower Body Dressing: Chair Toileting: Minimal assistance Where Assessed-Toileting: Bedside Commode Toilet Transfer: Moderate assistance (monitor BP - orthostatic) Toilet Transfer Method: Stand pivot Toilet Transfer Equipment: Bedside commode (PFRW) Tub/Shower Transfer: Other (comment), Not assessed (Unable to shower per MD) Vision   Perception    Praxis   Balance   Exercises:   Other Treatments:     Therapy/Group: Individual Therapy  Tonny Branch 01/10/2022, 6:54 AM

## 2022-01-11 LAB — URINALYSIS, ROUTINE W REFLEX MICROSCOPIC
Bilirubin Urine: NEGATIVE
Glucose, UA: NEGATIVE mg/dL
Hgb urine dipstick: NEGATIVE
Ketones, ur: NEGATIVE mg/dL
Nitrite: POSITIVE — AB
Protein, ur: NEGATIVE mg/dL
Specific Gravity, Urine: 1.011 (ref 1.005–1.030)
WBC, UA: >50 WBC/hpf — ABNORMAL HIGH (ref 0–5)
pH: 5 (ref 5.0–8.0)

## 2022-01-11 MED ORDER — CEPHALEXIN 250 MG PO CAPS
500.0000 mg | ORAL_CAPSULE | Freq: Two times a day (BID) | ORAL | Status: AC
  Administered 2022-01-11 – 2022-01-14 (×7): 500 mg via ORAL
  Filled 2022-01-11 (×8): qty 2

## 2022-01-11 MED ORDER — METOPROLOL SUCCINATE ER 25 MG PO TB24
12.5000 mg | ORAL_TABLET | Freq: Every day | ORAL | Status: DC
  Administered 2022-01-11: 12.5 mg via ORAL
  Filled 2022-01-11: qty 1

## 2022-01-11 NOTE — Progress Notes (Signed)
Physical Therapy Session Note  Patient Details  Name: Terri Drake MRN: [id number] Date of Birth: 18-Nov-1960  Today's Date: 01/11/2022 PT Individual Time: 3710-6269 PT Individual Time Calculation (min): 27 min   Short Term Goals: Week 1:  PT Short Term Goal 1 (Week 1): pt will transfer bed<>chair with LRAD and supervision PT Short Term Goal 2 (Week 1): pt will ambulate 34ft with LRAD and min A PT Short Term Goal 3 (Week 1): pt will perform simulated car transfer with LRAD and CGA  Skilled Therapeutic Interventions/Progress Updates:   Received pt sitting in [facility] with husband Terri Drake, present for family education training. Pt agreeable to PT treatment, and reported pain 4/10 in L shoulder - pt declined any pain medications. Session with emphasis on functional mobility/transfers, generalized strengthening and endurance, dynamic standing balance/coordination, and gait training. Pt performed WC mobility 164ft x 2 trials using BUE and LLE to/from dayroom. Pt performed all sit<>stands with L PFRW and CGA/close supervision. Educated Terri Drake on proper postioning, to stand on R side with gait, as well as importance of using gait belt. Pt then ambulated 43ft x1 and 4ft x 1 with L PFRW and CGA provided by husband, Terri Drake, with therapist providing WC follow - cues to keep PFRW underneath BOS and to increase reliance on BUE support to increase step height. Briefly educated Terri Drake, on technique for adjusting LSO for improved positioning. Concluded session with pt sitting in WC with Terri Drake present at bedside.   Orthostatics: - L ted hose donned BP sitting at rest: 106/58 BP after ambulating trial 1: 94/58 - pt reporting dizziness that resolved with seated rest BP after ambulating trial 2: 100/54 - pt reporting dizziness that resolved with seated rest  Therapy Documentation Precautions:  Precautions Precautions: Back, Fall Precaution Comments: orthostatic hypotension 5/15 Required Braces or Orthoses: Sling, Other  Brace Other Brace: LSO for OOB, sling for comfort (allowed to move LUE) Restrictions Weight Bearing Restrictions: Yes LUE Weight Bearing: Weight bearing as tolerated RLE Weight Bearing: Non weight bearing  Therapy/Group: Individual Therapy Martin Majestic PT, DPT  01/11/2022, 7:00 AM

## 2022-01-11 NOTE — Progress Notes (Signed)
Physical Therapy Session Note  Patient Details  Name: Terri Drake MRN: [id number] Date of Birth: May 26, 1961  Today's Date: 01/11/2022 PT Individual Time: 3086-5784 PT Individual Time Calculation (min): 44 min   Short Term Goals: Week 1:  PT Short Term Goal 1 (Week 1): pt will transfer bed<>chair with LRAD and supervision PT Short Term Goal 2 (Week 1): pt will ambulate 17ft with LRAD and min A PT Short Term Goal 3 (Week 1): pt will perform simulated car transfer with LRAD and CGA  Skilled Therapeutic Interventions/Progress Updates: Pt presents sitting in w/c w/ RLE elevated onto bed.  Pt agreeable to therapy.  BP monitored at initiation of therapy at 112/59 w/o abd binder.  Pt wheeled self to main gym w/ supervision using BUEs and LLE.  Pt amb multiple trials w/ platform walker and CGA x 16' before requiring seated rest break w/ c/o dizziness.  Pt BP monitored during session after amb at 120/63.  Pt given education on use of platform walker to improve L foot clearance and for safe turns.  BP monitored at conclusion of session at 132/58, pt pleased w/ numbers despite moderate c/o dizziness.  Pt wheeled self to room and remained sitting in w/c w/ RLE elevated and all needs in reach.     Therapy Documentation Precautions:  Precautions Precautions: Back, Fall Precaution Comments: orthostatic hypotension 5/15 Required Braces or Orthoses: Sling, Other Brace Other Brace: LSO for OOB, sling for comfort (allowed to move LUE) Restrictions Weight Bearing Restrictions: Yes LUE Weight Bearing: Weight bearing as tolerated RLE Weight Bearing: Non weight bearing General:   Vital Signs: Therapy Vitals Temp: 98.4 F (36.9 C) Pulse Rate: 66 Resp: 18 BP: (!) 122/55 Patient Position (if appropriate): Sitting Oxygen Therapy SpO2: 98 % O2 Device: Room Air Pain:pt states some pain in L scapula, but does not wish any pain meds.       Therapy/Group: Individual Therapy  Lucio Edward 01/11/2022, 3:22 PM

## 2022-01-11 NOTE — Patient Care Conference (Signed)
Inpatient RehabilitationTeam Conference and Plan of Care Update Date: 01/11/2022   Time: 11:30 AM    Patient Name: Terri Drake      Medical Record Number: [id number]  Date of Birth: 1960/12/24 Sex: Female         Room/Bed: 4W11C/4W11C-01 Payor Info: Payor: [employer] WORK[employer] COMP / Plan: CCMSI / Product Type: *No Product type* /    Admit Date/Time:  01/04/2022 12:59 PM  Primary Diagnosis:  Trimalleolar fracture  Hospital Problems: Principal Problem:   Trimalleolar fracture Active Problems:   PAF (paroxysmal atrial fibrillation) [facility])    Expected Discharge Date: Expected Discharge Date: 01/18/22  Team Members Present: Physician leading conference: Dr. Sula Soda Social Worker Present: Lavera Guise, BSW Nurse Present: Kennyth Arnold, RN PT Present: Raechel Chute, PT OT Present: Candee Furbish, OT PPS Coordinator present : Fae Pippin, SLP     Current Status/Progress Goal Weekly Team Focus  Bowel/Bladder   Patient is continent of Bladder/Bowel, LBM  remain continent  toilet as needed   Swallow/Nutrition/ Hydration             ADL's   min A bathing, min A LB dressing, Mod A toileting. Pt only able to tolerate LUE AAROM at this time however progressing slowly  mod I  functional endurance/independence, AAROM/AROM and pain management of LUE   Mobility   bed mobility supervision, transfers with L PFRW and CGA, gait 3ft with L PFRW and CGA  Mod I  fmaily education, functional mobility/transfers, generalized strengthening and endurance, dynamic standing balance/coordination, gait training, and D/C planning   Communication             Safety/Cognition/ Behavioral Observations            Pain   reports 6/10 pain  < 3  assess pain q 4hr and prn, medicate as needed   Skin   incision clean and dry  no skin breakdown  assess skin q 4hr and prn     Discharge Planning:  discharging home with spouse providing assistance. Workers com in the process of ramp, and DME    Team Discussion: Will discharge on Xarelto. [name] discontinued. BP soft with dizziness. Reports left scapular pain. Ordered UA & culture. Has good support from spouse. Continent B/B, medicating appropriate for pain, incision clean and dry. Discharging home with spouse, working on building ramp. Equipment has been ordered through [employer]. HH to begin with and then outpatient therapy if possible.  Patient on target to meet rehab goals: yes, mod I goals. Currently supervision with bed mobility. CGA with platform walker, supervision shower, min assist lower body dressing, CGA toileting. Pain tolerance improving.  *See Care Plan and progress notes for long and short-term goals.   Revisions to Treatment Plan:  Adjusting medications.   Teaching Needs: Family education, medication/pain management, skin/wound care, transfer/gait training, etc.   Current Barriers to Discharge: Home enviroment access/layout, Wound care, Weight, and Weight bearing restrictions  Possible Resolutions to Barriers: Family education Installation of ramp [facility] therapy Outpatient therapy     Medical Summary               I attest that I was present, lead the team conference, and concur with the assessment and plan of the team.   Tennis Must 01/11/2022, 11:38 AM

## 2022-01-11 NOTE — Progress Notes (Signed)
Occupational Therapy Session Note  Patient Details  Name: Terri Drake MRN: [id number] Date of Birth: [birth year]  Today's Date: 01/11/2022 OT Individual Time: [id number] OT Individual Time Calculation (min): 57 min    Short Term Goals: Week 1:  OT Short Term Goal 1 (Week 1): Patient will stand pivot transfer with platform walker to [facility] with min assist OT Short Term Goal 2 (Week 1): Patient will stand to pull up/down LB clothing with intermittent min assistance OT Short Term Goal 3 (Week 1): Patient will demonstrate 53* of shoulder flexion with LUE without report of increased pain  Skilled Therapeutic Interventions/Progress Updates:  Skilled OT intervention completed with focus on functional endurance and ADL retraining. Pt received upright in bed, no initial c/o pain, requesting to use bathroom and take a shower. Assessed BP at rest with 130/79 reading, donned TEDs to LLE to prevent drop in BP, shoe and back brace with total A for time management. All transfers completed during session via sit > stand with CGA using L PFRW, then stand pivot with CGA to [facility]. Continent of bladder only, completing pericare with mod I. Stand pivot to w/c, transported pt into bathroom, then semi-squat pivot using grab bars <> TTB. Wrapped RLE with water proof cover, with TED hose still donned for BP management. Ensured water was lukewarm, then pt completed all bathing with supervision/set up A for seated bathing only. Assessed BP immediately after shower with 103/63. Minor c/o dizziness. Completed dressing in w/c with set up A for UB including corset brace, then use of adaptive reacher for threading of BLEs with set up A then sit > stand with CGA using L PFRW and min A needed for donning over buttocks 2/2 dizziness. BP at 103/63 after dressing with increased dizziness reported, with improvement reported after prolonged rest however pt reporting that she feels like she needed more time to recover. Per c/o soreness in L  trapezius muscle, therapist completed light tissue massage for pain relief, with education provided about how guarding the LUE during tasks can have effect on muscle tightness/swelling from accident that can induce pain. Pt was left upright in w/c, with chair alarm on, all needs in reach at end of session.    Therapy Documentation Precautions:  Precautions Precautions: Back, Fall Precaution Comments: orthostatic hypotension 5/15 Required Braces or Orthoses: Sling, Other Brace Other Brace: LSO for OOB, sling for comfort (allowed to move LUE) Restrictions Weight Bearing Restrictions: Yes LUE Weight Bearing: Weight bearing as tolerated RLE Weight Bearing: Non weight bearing    Therapy/Group: Individual Therapy  Pakou Rainbow E Scarlettrose Costilow 01/11/2022, 7:42 AM

## 2022-01-11 NOTE — Progress Notes (Signed)
Patient ID: Terri Drake, female   DOB: 12/02/1960, 61 y.o.   MRN: [id number]  Team Conference Report to Patient/Family  Team Conference discussion was reviewed with the patient and caregiver, including goals, any changes in plan of care and target discharge date.  Patient and caregiver express understanding and are in agreement.  The patient has a target discharge date of 01/18/22.  SW met with patient and provided team conference updates. Patient pleased about upcoming discharge date. Sw informed patient spouse would like to change the route of delivery for the patient DME. SW informed patient that spouse will need to reach back out to [employer] CM to see if that option is available at this point. Delivery of DME is scheduled for tomorrow. Spouse will bring it in personally if too late. No additional questions or concerns.   Dyanne Iha 01/11/2022, 2:07 PM

## 2022-01-11 NOTE — Progress Notes (Signed)
Occupational Therapy Session Note  Patient Details  Name: Terri Drake MRN: [id number] Date of Birth: 1960-09-10  Today's Date: 01/11/2022 OT Individual Time: 4742-5956 OT Individual Time Calculation (min): 70 min    Short Term Goals: Week 1:  OT Short Term Goal 1 (Week 1): Patient will stand pivot transfer with platform walker to [facility] with min assist OT Short Term Goal 2 (Week 1): Patient will stand to pull up/down LB clothing with intermittent min assistance OT Short Term Goal 3 (Week 1): Patient will demonstrate 60* of shoulder flexion with LUE without report of increased pain  Skilled Therapeutic Interventions/Progress Updates:    Pt resting in w/c upon arrival and agreeable to therapy. Pt propelled w/c with supervision to day room for LUE closed chain and open chain activities to increase LUE AROM and facilitate independence with BADLs. Activities included LUE slides in forward and laterally to stretch prior to tasks. Pt engaged in peg board tasks "stacking" pegs and removing, constructing pipe tree structures, and stacking large Lego blocks and removing. Pt cleaned all supplies and placed into containers. Pt ended with slides on inclined surface before open chain tasks of removing wash cloths from elevated surface and handing to OTA. Pt returned to room. Pt remained in w/c. All needs within reach and ice applied to Lt scapula.  Therapy Documentation Precautions:  Precautions Precautions: Back, Fall Precaution Comments: orthostatic hypotension 5/15 Required Braces or Orthoses: Sling, Other Brace Other Brace: LSO for OOB, sling for comfort (allowed to move LUE) Restrictions Weight Bearing Restrictions: Yes LUE Weight Bearing: Weight bearing as tolerated RLE Weight Bearing: Non weight bearing  Pain:  Pt reports 4/10 Lt scapula pain at rest, escalating with activity; rest and ice    Therapy/Group: Individual Therapy  Rich Brave 01/11/2022, 11:59 AM

## 2022-01-11 NOTE — Progress Notes (Signed)
PROGRESS NOTE   Subjective/Complaints: Appreciate cardiology consult, hypotension continues to be an issue during therapy. Will reach out to see if Metoprolol can be decreased further given she is now on Xarelto  ROS: +dizziness with low blood pressure, +left sided scapular pain- range of motion improving, voiding well, denies SOB, fevers or chiills   Objective:   No results found. No results for input(s): WBC, HGB, HCT, PLT in the last 72 hours.  Recent Labs    01/09/22 0654  NA 138  K 4.3  CL 106  CO2 25  GLUCOSE 115*  BUN 12  CREATININE 0.75  CALCIUM 9.0     Intake/Output Summary (Last 24 hours) at 01/11/2022 1130 Last data filed at 01/11/2022 0900 Gross per 24 hour  Intake 720 ml  Output 800 ml  Net -80 ml        Physical Exam: Vital Signs Blood pressure (!) 135/58, pulse 62, temperature 97.7 F (36.5 C), resp. rate 18, height 5\' 7"  (1.702 m), weight 88.5 kg, SpO2 97 %. Gen: no distress, normal appearing, BMI 30.56 HEENT: oral mucosa pink and moist, NCAT Cardio: Bradycardic Chest: normal effort, normal rate of breathing Abd: soft, non-distended Ext: no edema Psych: pleasant, normal affect Skin: splint in place to right lower extremity. Bruising of left arm. Would care dressing to left knee abraison Neuro: Alert x3 and follows commands Sit to stand MinA MSK: improved range of motion of left shoulder   Assessment/Plan: 1. Functional deficits which require 3+ hours per day of interdisciplinary therapy in a comprehensive inpatient rehab setting. Physiatrist is providing close team supervision and 24 hour management of active medical problems listed below. Physiatrist and rehab team continue to assess barriers to discharge/monitor patient progress toward functional and medical goals  Care Tool:  Bathing    Body parts bathed by patient: Right arm, Left arm, Chest, Abdomen, Front perineal area, Right  upper leg, Left upper leg, Right lower leg, Left lower leg, Face, Buttocks   Body parts bathed by helper: Buttocks     Bathing assist Assist Level: Supervision/Verbal cueing     Upper Body Dressing/Undressing Upper body dressing   What is the patient wearing?: Button up shirt    Upper body assist Assist Level: Set up assist    Lower Body Dressing/Undressing Lower body dressing      What is the patient wearing?: Underwear/pull up, Pants     Lower body assist Assist for lower body dressing: Minimal Assistance - Patient > 75%     Toileting Toileting    Toileting assist Assist for toileting: Contact Guard/Touching assist     Transfers Chair/bed transfer  Transfers assist     Chair/bed transfer assist level: Contact Guard/Touching assist     Locomotion Ambulation   Ambulation assist   Ambulation activity did not occur: Safety/medical concerns  Assist level: Contact Guard/Touching assist Assistive device: Walker-platform Max distance: 82ft   Walk 10 feet activity   Assist  Walk 10 feet activity did not occur: Safety/medical concerns (low BP, decreased balance, pain)  Assist level: Contact Guard/Touching assist Assistive device: Walker-rolling   Walk 50 feet activity   Assist Walk 50 feet with 2 turns  activity did not occur: Safety/medical concerns (low BP, decreased balance, pain)         Walk 150 feet activity   Assist Walk 150 feet activity did not occur: Safety/medical concerns (low BP, decreased balance, pain)         Walk 10 feet on uneven surface  activity   Assist Walk 10 feet on uneven surfaces activity did not occur: Safety/medical concerns (low BP, decreased balance, pain)         Wheelchair     Assist Is the patient using a wheelchair?: Yes Type of Wheelchair: Manual Wheelchair activity did not occur: Safety/medical concerns (low BP)  Wheelchair assist level: Supervision/Verbal cueing Max wheelchair distance: 124ft     Wheelchair 50 feet with 2 turns activity    Assist    Wheelchair 50 feet with 2 turns activity did not occur: Safety/medical concerns (low BP)   Assist Level: Supervision/Verbal cueing   Wheelchair 150 feet activity     Assist  Wheelchair 150 feet activity did not occur: Safety/medical concerns (low BP)       Blood pressure (!) 135/58, pulse 62, temperature 97.7 F (36.5 C), resp. rate 18, height 5\' 7"  (1.702 m), weight 88.5 kg, SpO2 97 %.  Medical Problem List and Plan: 1. Functional deficits secondary to closed right trimalleolar ankle fracture dislocation status post ORIF and closed left scapular body fracture nonoperative with conservative care.  Nonweightbearing right lower extremity x8 weeks with splint x2 weeks then convert to CAM for range of motion exercises.  Weightbearing as tolerated left upper extremity with sling for comfort.             -patient may not shower             -ELOS/Goals: 10-14 days modI          Continue CIR PT and OT  -Interdisciplinary Team Conference today   2.  Atrial fibrillation: Vascular study reviewed and is negative for clot.  Convert to Xarelto 10 mg daily x30 days now to minimize injections.              -antiplatelet therapy: N/A 3. Pain: continue Lidoderm patch, Robaxin 1000 mg 4 times daily, oxycodone as needed. Discussed amitriptyline at night but she refuses given potential side effects 4. Insomnia: Continue 5-10mg  oxycodone HS prn to help her sleep 5. Neuropsych: This patient is capable of making decisions on her own behalf. 6. Skin/Wound Care: Routine skin checks 7. Fluids/Electrolytes/Nutrition: Routine in and outs with follow-up chemistries 8.  T11/12 endplate fracture.  Lumbar corset as directed per Dr. Kathyrn Sheriff 9.  Left rib fractures 2 and 6.  Conservative care 10.  Urinary retention.  Discontinue [name].  11.  Atrial fibrillation with RVR.  Follow-up cardiology services.  Flecainide 100 mg every 12 hours,  Lopressor 37.5 mg twice daily. Start magnesium gluconate 250mg  HS.   -Followed by cardiology, metoprolol now 25 daily  Given bradycardia, will discuss with patient whether she wants to decrease Toprol dose to 12.5mg  12.  Constipation.  MiraLAX daily, Colace 100 mg twice daily. Start magnesium gluconate 250mg  HS. Check magnesium level tomorrow morning.  13. Vitamin D deficiency: Level is 17: started ergocalciferol 50,000U once per week every 7 weeks on 5/17.  14. Transaminitis: Tylenol changed to PRN, repeat CMP Monday.  15. Hypotension: abdominal binder and TEDs ordered for therapy- did not need today since AM afib meds were held.  16. Bradycardia: Toprol changed to 25mg  XL HS.   -5/22 Pulse last  check 69, stable continue to follow 17. Left scapular fracture with worsening pain: MRI ordered and results discussed with patient 18. Hypotension: improved, continue abdominal binder and TEDs.   -Cardiology recommending increased salt intake, continue to monitor  -decrease Toprol XL to 12.5mg , discussed with cardiology, will wean off tomorrow 19. Dysuria: check UA today    LOS: 7 days A FACE TO FACE EVALUATION WAS PERFORMED  Terri Drake P Terri Drake 01/11/2022, 11:30 AM

## 2022-01-12 NOTE — Progress Notes (Signed)
PROGRESS NOTE   Subjective/Complaints: Blood pressure improved with decrease in Toprol but still with dizziness after 16 feet of walking. Will d/c Toprol tonight UA +: started keflex  ROS: +dizziness with low blood pressure, +left sided scapular pain- range of motion improving, voiding well, denies SOB, fevers or chiills, +dysuria   Objective:   No results found. No results for input(s): WBC, HGB, HCT, PLT in the last 72 hours.  No results for input(s): NA, K, CL, CO2, GLUCOSE, BUN, CREATININE, CALCIUM in the last 72 hours.    Intake/Output Summary (Last 24 hours) at 01/12/2022 1114 Last data filed at 01/11/2022 2141 Gross per 24 hour  Intake 950 ml  Output --  Net 950 ml        Physical Exam: Vital Signs Blood pressure 132/67, pulse 63, temperature 97.8 F (36.6 C), temperature source Oral, resp. rate 16, height 5\' 7"  (1.702 m), weight 88.5 kg, SpO2 96 %. Gen: no distress, normal appearing, BMI 30.56 HEENT: oral mucosa pink and moist, NCAT Cardio: Bradycardic Chest: normal effort, normal rate of breathing Abd: soft, non-distended Ext: no edema Psych: pleasant, normal affect Skin: splint in place to right lower extremity. Bruising of left arm. Would care dressing to left knee abraison Neuro: Alert x3 and follows commands Sit to stand MinA MSK: improved range of motion of left shoulder to 90 degrees, pain imited   Assessment/Plan: 1. Functional deficits which require 3+ hours per day of interdisciplinary therapy in a comprehensive inpatient rehab setting. Physiatrist is providing close team supervision and 24 hour management of active medical problems listed below. Physiatrist and rehab team continue to assess barriers to discharge/monitor patient progress toward functional and medical goals  Care Tool:  Bathing    Body parts bathed by patient: Right arm, Left arm, Chest, Abdomen, Front perineal area, Right  upper leg, Left upper leg, Right lower leg, Left lower leg, Face, Buttocks   Body parts bathed by helper: Buttocks     Bathing assist Assist Level: Supervision/Verbal cueing     Upper Body Dressing/Undressing Upper body dressing   What is the patient wearing?: Button up shirt    Upper body assist Assist Level: Set up assist    Lower Body Dressing/Undressing Lower body dressing      What is the patient wearing?: Underwear/pull up, Pants     Lower body assist Assist for lower body dressing: Minimal Assistance - Patient > 75%     Toileting Toileting    Toileting assist Assist for toileting: Contact Guard/Touching assist     Transfers Chair/bed transfer  Transfers assist     Chair/bed transfer assist level: Contact Guard/Touching assist     Locomotion Ambulation   Ambulation assist   Ambulation activity did not occur: Safety/medical concerns  Assist level: Contact Guard/Touching assist Assistive device: Walker-platform Max distance: 16   Walk 10 feet activity   Assist  Walk 10 feet activity did not occur: Safety/medical concerns (low BP, decreased balance, pain)  Assist level: Contact Guard/Touching assist Assistive device: Walker-platform   Walk 50 feet activity   Assist Walk 50 feet with 2 turns activity did not occur: Safety/medical concerns (low BP, decreased balance, pain)  Walk 150 feet activity   Assist Walk 150 feet activity did not occur: Safety/medical concerns (low BP, decreased balance, pain)         Walk 10 feet on uneven surface  activity   Assist Walk 10 feet on uneven surfaces activity did not occur: Safety/medical concerns (low BP, decreased balance, pain)         Wheelchair     Assist Is the patient using a wheelchair?: Yes Type of Wheelchair: Manual Wheelchair activity did not occur: Safety/medical concerns (low BP)  Wheelchair assist level: Supervision/Verbal cueing Max wheelchair distance: 150     Wheelchair 50 feet with 2 turns activity    Assist    Wheelchair 50 feet with 2 turns activity did not occur: Safety/medical concerns (low BP)   Assist Level: Supervision/Verbal cueing   Wheelchair 150 feet activity     Assist  Wheelchair 150 feet activity did not occur: Safety/medical concerns (low BP)   Assist Level: Supervision/Verbal cueing   Blood pressure 132/67, pulse 63, temperature 97.8 F (36.6 C), temperature source Oral, resp. rate 16, height 5\' 7"  (1.702 m), weight 88.5 kg, SpO2 96 %.  Medical Problem List and Plan: 1. Functional deficits secondary to closed right trimalleolar ankle fracture dislocation status post ORIF and closed left scapular body fracture nonoperative with conservative care.  Nonweightbearing right lower extremity x8 weeks with splint x2 weeks then convert to CAM for range of motion exercises.  Weightbearing as tolerated left upper extremity with sling for comfort.             -patient may not shower             -ELOS/Goals: 10-14 days modI          Continue CIR PT and OT 2.  Atrial fibrillation: Vascular study reviewed and is negative for clot.  Convert to Xarelto 10 mg daily x30 days now to minimize injections. D/c metoprolol, discussed with cardiology.              -antiplatelet therapy: N/A 3. Pain: continue Lidoderm patch, Robaxin 1000 mg 4 times daily, oxycodone as needed. Discussed amitriptyline at night but she refuses given potential side effects 4. Insomnia: Continue 5-10mg  oxycodone HS prn to help her sleep. Discussed amitriptyline but patient defers.  5. Neuropsych: This patient is capable of making decisions on her own behalf. 6. Skin/Wound Care: Routine skin checks 7. Fluids/Electrolytes/Nutrition: Routine in and outs with follow-up chemistries 8.  T11/12 endplate fracture.  Lumbar corset as directed per Dr. 9.  Left rib fractures 2 and 6.  Conservative care 10.  Urinary retention.  Discontinue [name].  11.   Atrial fibrillation with RVR.  Follow-up cardiology services.  Flecainide 100 mg every 12 hours, Lopressor 37.5 mg twice daily. Start magnesium gluconate 250mg  HS.   -Followed by cardiology, metoprolol now 25 daily  Given bradycardia, will discuss with patient whether she wants to decrease Toprol dose to 12.5mg  12.  Constipation.  MiraLAX daily, Colace 100 mg twice daily. Start magnesium gluconate 250mg  HS. Check magnesium level tomorrow morning.  13. Vitamin D deficiency: Level is 17: started ergocalciferol 50,000U once per week every 7 weeks on 5/17.  14. Transaminitis: Tylenol changed to PRN, repeat CMP Monday.  15. Hypotension: abdominal binder and TEDs ordered for therapy- did not need today since AM afib meds were held.  16. Bradycardia: Toprol changed to 25mg  XL HS.   -5/22 Pulse last check 69, stable continue to follow 17. Left scapular fracture with  worsening pain: MRI ordered and results discussed with patient 18. Hypotension: improved, continue abdominal binder and TEDs.   -Cardiology recommending increased salt intake, continue to monitor  -decrease Toprol XL to 12.5mg , discussed with cardiology, will wean off tomorrow 19. UTI: start keflex    LOS: 8 days A FACE TO FACE EVALUATION WAS PERFORMED  Byran Bilotti P Artis Buechele 01/12/2022, 11:14 AM

## 2022-01-12 NOTE — Progress Notes (Signed)
Physical Therapy Session Note  Patient Details  Name: Terri Drake MRN: [id number] Date of Birth: [birth year]  Today's Date: 01/12/2022 PT Individual Time: 1300-1358 PT Individual Time Calculation (min): 58 min   Short Term Goals: Week 1:  PT Short Term Goal 1 (Week 1): pt will transfer bed<>chair with LRAD and supervision PT Short Term Goal 2 (Week 1): pt will ambulate 67ft with LRAD and min A PT Short Term Goal 3 (Week 1): pt will perform simulated car transfer with LRAD and CGA  Skilled Therapeutic Interventions/Progress Updates:    Session focused on functional transfers, gait with PFRW, and management of low BP symptoms. Pt up in w/c and propels w/c on unit independently to/from therapy session with BUE and LLE for functional mobility and endurance. BP assessed throughout bouts of gait before and after (see details below). Education provided throughout session in regards to BP management, monitoring of symptoms, use of breath, hydration, etc. Pt performed gait with focus on gait quality and technique and clearance of LLE x bouts of 23' each trial x 4. On last attempt, used "exhale on exertion" to improve ability to manage breath during upright mobility. In between 2 reps, performed  standing therex for up to 2 min to tolerance before symptoms of lightheadedness required seated rest break including RLE hip flexion and extension x 10 reps each x 2 sets. Pt also mentioned practicing a curb step. Demonstrated technique (ascending backwards) and pt states she did not feel ready to attempt this today. Will defer to future session.   Therapy Documentation Precautions:  Precautions Precautions: Back, Fall Precaution Comments: orthostatic hypotension 5/15 Required Braces or Orthoses: Sling, Other Brace Other Brace: LSO for OOB, sling for comfort (allowed to move LUE) Restrictions Weight Bearing Restrictions: Yes LUE Weight Bearing: Weight bearing as tolerated RLE Weight Bearing: Non weight  bearing   Vital Signs:  BP seated: 123/63 (MAP == 79) mmHg; HR  = 66 bpm 119/59 mmHg;  MAP = 75HR = 67 bpm after gait x 23' (mildly symptomatic) 110/37 mmHg; HR = 65 bpm after gait x 23' ("very dizzy") 112/48 mmHg; HR = 62 bpm after 2 min rest 107/59 mmHg; HR = 63 bpm after 2 min rest.  110/64 mmHg; HR = 75 bpm  107/60 mmHg; HR = 63 bpm 2 min of standing therex = BP = 120/62 mmHg; HR = 60 bpm 119/68 mmHg  after 15' gait with exhale on exertion technique   Pain:  Denies pain.    Therapy/Group: Individual Therapy  Karolee Stamps Darrol Poke, PT, DPT, CBIS  01/12/2022, 2:06 PM

## 2022-01-12 NOTE — Progress Notes (Signed)
Patient ID: Terri Drake, female   DOB: Nov 04, 1960, 61 y.o.   MRN: [id number]  SW left VM for patient worker's comp cm to determine the number of HH/OP visits. Sw waiting on follow up.

## 2022-01-12 NOTE — Progress Notes (Signed)
Physical Therapy Session Note  Patient Details  Name: Terri Drake MRN: [id number] Date of Birth: 1961/08/20  Today's Date: 01/12/2022 PT Individual Time: 0109-3235 PT Individual Time Calculation (min): 29 min   Short Term Goals: Week 1:  PT Short Term Goal 1 (Week 1): pt will transfer bed<>chair with LRAD and supervision PT Short Term Goal 2 (Week 1): pt will ambulate 60ft with LRAD and min A PT Short Term Goal 3 (Week 1): pt will perform simulated car transfer with LRAD and CGA  Skilled Therapeutic Interventions/Progress Updates:    Pt received sitting in w/c with R LE propped up on end of bed. Pt agreeable to therapy session and already wearing knee high TED hose and LSO.  R UE and L LE w/c propulsion ~129ft to main therapy gym with distant supervision - no cuing needed for propulsion technique with appropriate speed achieved - educated on how to doff R LE leg rest.  Pt reports her goal is to improve her gait technique.  Adjusted L platform angle to improve weight distribution through forearm to increase support and ability to bear weight.  Sitting vitals at beginning of session: BP 120/62 (MAP 77), HR 62bpm   Sit<>stands w/c<>L PFRW with CGA for safety throughout session.  Gait training ~65ft x2 using L PFRW with CGA and therapist bringing w/c behind pt in event of orthostatic hypotension. Pt continues to demo poor sequencing of timing pushing down through B UEs on AD and hopping with L LE in order to achieve adequate foot clearance, forward progression of hop, and soft landing.  Therapist provided visual demonstration on sequencing to improve pt timing with slight improvement after repetitions, but would benefit from additional practice. Pt also may benefit from raising height of AD to give her more leverage.  Vitals:  After 1st gait: BP 100/66 (MAP 77), HR 60bpm   After 2nd gait: BP 80/45 (MAP 56), HR 61bpm   After seated rest break with bilateral arm and leg movements: BP  106/63 (MAP 75), HR 63bpm   Pt performed wheelchair mobility back to room as described above. Pt left seated in w/c with needs in reach.  Therapy Documentation Precautions:  Precautions Precautions: Back, Fall Precaution Comments: orthostatic hypotension 5/15 Required Braces or Orthoses: Sling, Other Brace Other Brace: LSO for OOB, sling for comfort (allowed to move LUE) Restrictions Weight Bearing Restrictions: Yes LUE Weight Bearing: Weight bearing as tolerated RLE Weight Bearing: Non weight bearing   Pain:  Reports unrated L scapular pain and pt utilizing ice at beginning of session for pain management - no intervention needed during session.   Therapy/Group: Individual Therapy  [name] , PT, DPT, NCS, CSRS 01/12/2022, 8:04 AM

## 2022-01-12 NOTE — Progress Notes (Signed)
Occupational Therapy Session Note  Patient Details  Name: Terri Drake MRN: [id number] Date of Birth: Jul 04, 1961  Today's Date: 01/12/2022 OT Individual Time: [id number] OT Individual Time Calculation (min): 78 min    Short Term Goals: Week 1:  OT Short Term Goal 1 (Week 1): Patient will stand pivot transfer with platform walker to [facility] with min assist OT Short Term Goal 2 (Week 1): Patient will stand to pull up/down LB clothing with intermittent min assistance OT Short Term Goal 3 (Week 1): Patient will demonstrate 81* of shoulder flexion with LUE without report of increased pain  Skilled Therapeutic Interventions/Progress Updates:    Patient received up in wheelchair - husband in room.  Patient indicates that her husband helped her to get dressed.  Patient eager to go to gym to "work out my arm again."   Patient wheeled herself to rehab dayroom to continue exercises performed yesterday.   1)  Warm up - table slides level surface 2)  Pegboard - moving from closed to open chain - building three tiers to sustain muscle activation in LUE.  Encouraged use of upright posture to aide with forward /upward reach.   3) Lego blocks - building and deconstructing patterns.  4)  Pipe tree - three levels - working to increase shoulder range of motion and sustained activation in mid to higher reach.   5)  Cool down - table slides up incline  Patient allowed to rest between each exercise.  Pain did increase to 4-5 during activity but quickly came down at rest,  Ice pack applied to left shoulder following OT session.    Also discussed concerns regarding discharge next week.  Patient concerned about her office being upstairs - thinking she may crab walk up stairs to office - discussed that this may be challenging then to stand from that [place].  Discussed that is likely wb status may change allowing her to consider going up stairs upright.   Also discussed possible means to assist with rolling and  getting out of bed - discussed bed ladder and partial bed rail.  Patient investigating.    Therapy Documentation Precautions:  Precautions Precautions: Back, Fall Precaution Comments: orthostatic hypotension 5/15 Required Braces or Orthoses: Sling, Other Brace Other Brace: LSO for OOB, sling for comfort (allowed to move LUE) Restrictions Weight Bearing Restrictions: Yes LUE Weight Bearing: Weight bearing as tolerated RLE Weight Bearing: Non weight bearing  Pain: Pain Assessment Pain Scale: 0-10 Pain Score: 2-3/10 without pain medication  Shoulder, back   Therapy/Group: Individual Therapy  Mariah Milling 01/12/2022, 12:22 PM

## 2022-01-12 NOTE — Progress Notes (Signed)
Occupational Therapy Session Note  Patient Details  Name: Terri Drake MRN: [id number] Date of Birth: [birth year]  Today's Date: 01/12/2022 OT Individual Time: 1420-1508 OT Individual Time Calculation (min): 48 min    Short Term Goals: Week 1:  OT Short Term Goal 1 (Week 1): Patient will stand pivot transfer with platform walker to [facility] with min assist OT Short Term Goal 2 (Week 1): Patient will stand to pull up/down LB clothing with intermittent min assistance OT Short Term Goal 3 (Week 1): Patient will demonstrate 28* of shoulder flexion with LUE without report of increased pain  Skilled Therapeutic Interventions/Progress Updates:    Patient received up in her w/c eager to get to therapy gym and practice "working on her LUE." Pt independently propelled her w/c to day room to work on LUE ROM and [place]. Pt warmed up by using her LUE to slide a washcloth on the table in an arc motion. Pt instructed to roll yellow thera-putty into a ball, flatten it, roll it into a log, and pinch manipulating one digit and thumb at a time. Pt completed this sequence with each digit. Requested a brief break before beginning the next task. Next, pt manipulated 8 beads out of rolled thera-putty to increase [facility] of  L hand. Pt then lifted her LUE to place the beads into a bucket on an inclined surface. Completed a clothes pin activity by pinching red clothespins onto first row then onto second to increase digit strength and LUE ROM. Pt finished by using her LUE to complete table slides on a wedge to move her shoulder into flexion/extension. Pt left in w/c with all needs met and ice pack on L shoulder.  Therapy Documentation Precautions:  Precautions Precautions: Back, Fall Precaution Comments: orthostatic hypotension 5/15 Required Braces or Orthoses: Sling, Other Brace Other Brace: LSO for OOB, sling for comfort (allowed to move LUE) Restrictions Weight Bearing Restrictions: Yes LUE Weight Bearing: Weight bearing  as tolerated RLE Weight Bearing: Non weight bearing:   Therapy/Group: Individual Therapy  [name] 01/12/2022, 3:13 PM

## 2022-01-13 LAB — URINE CULTURE: Culture: >=100000 — AB

## 2022-01-13 MED ORDER — METOPROLOL TARTRATE 12.5 MG HALF TABLET
12.5000 mg | ORAL_TABLET | Freq: Once | ORAL | Status: AC
  Administered 2022-01-13: 12.5 mg via ORAL
  Filled 2022-01-13: qty 1

## 2022-01-13 MED ORDER — METOPROLOL SUCCINATE ER 25 MG PO TB24
25.0000 mg | ORAL_TABLET | Freq: Every day | ORAL | Status: DC
  Administered 2022-01-13 – 2022-01-17 (×5): 25 mg via ORAL
  Filled 2022-01-13 (×5): qty 1

## 2022-01-13 MED ORDER — METOPROLOL TARTRATE 12.5 MG HALF TABLET: 12.5000 mg | ORAL_TABLET | Freq: Four times a day (QID) | ORAL | Status: DC | PRN

## 2022-01-13 MED ORDER — METOPROLOL SUCCINATE ER 25 MG PO TB24: 12.5000 mg | ORAL_TABLET | Freq: Every day | ORAL | Status: DC

## 2022-01-13 MED ORDER — METOPROLOL TARTRATE 12.5 MG HALF TABLET
12.5000 mg | ORAL_TABLET | Freq: Four times a day (QID) | ORAL | Status: DC | PRN
Start: 2022-01-13 — End: 2022-01-13

## 2022-01-13 NOTE — Progress Notes (Signed)
Occupational Therapy Weekly Progress Note  Patient Details  Name: Terri Drake MRN: [id number] Date of Birth: 1961-07-03  Beginning of progress report period: Jan 05, 2022 End of progress report period: Jan 13, 2022    Patient has met 3 of 3 short term goals.  Pt is making great progress towards LTGs. Pt has progressed functional transfers from mod A stand pivot using RW to supervision stand pivot/few step/hop with RW, is able to bathe at an overall supervision to set up A level in the shower, dress at an overall min A level with use of AE as needed and requires min assist for toileting tasks. Pt continues to be demonstrate improvement with stable BP that has enabled therapy progression, is able to tolerate AAROM of LUE progressing with AROM activities to promote functional use of LUE. Pt's husband has been involved in family education, and both appear to be taking pt's therapy goals very seriously.  Patient continues to demonstrate the following deficits: muscle weakness, decreased cardiorespiratoy endurance, and decreased standing balance and therefore will continue to benefit from skilled OT intervention to enhance overall performance with BADL and iADL.  Patient progressing toward long term goals..  Continue plan of care.  OT Short Term Goals Week 1:  OT Short Term Goal 1 (Week 1): Patient will stand pivot transfer with platform walker to [facility] with min assist OT Short Term Goal 1 - Progress (Week 1): Met OT Short Term Goal 2 (Week 1): Patient will stand to pull up/down LB clothing with intermittent min assistance OT Short Term Goal 2 - Progress (Week 1): Met OT Short Term Goal 3 (Week 1): Patient will demonstrate 60* of shoulder flexion with LUE without report of increased pain OT Short Term Goal 3 - Progress (Week 1): Met Week 2:  OT Short Term Goal 1 (Week 2): STG = LTG 2/2 ELOS    [name] [name] 01/13/2022, 7:53 AM

## 2022-01-13 NOTE — Progress Notes (Signed)
Follow up to PRN metoprolol, patient's pulse regular to palpation.

## 2022-01-13 NOTE — Progress Notes (Signed)
Occupational Therapy Session Note  Patient Details  Name: Terri Drake MRN: [id number] Date of Birth: 04/19/1961  Today's Date: 01/13/2022 OT Individual Time: 1004-1059 OT Individual Time Calculation (min): 55 min    Short Term Goals: Week 1:  OT Short Term Goal 1 (Week 1): Patient will stand pivot transfer with platform walker to [facility] with min assist OT Short Term Goal 2 (Week 1): Patient will stand to pull up/down LB clothing with intermittent min assistance OT Short Term Goal 3 (Week 1): Patient will demonstrate 31* of shoulder flexion with LUE without report of increased pain  Skilled Therapeutic Interventions/Progress Updates:  Skilled OT intervention completed with focus on LUE AROM within functional context and HEP. Pt received upright in w/c, requesting to wash hair, no c/o pain. BP <100 this session, therefore deferred to hair washing tray, however progressed task to pt using LUE for lathering shampoo/conditioner and hair grooming as pt is tolerating more AROM at shoulder. Therapist only rinsing hair to prevent water spillage.   Self-propelled in w/c <> gym with mod I using BUE and LLE. Issued pt a HEP for AROM of LUE including the following to promote functional use of the non-dominant side:  (All unweighted, but against gravity) Shoulder flexion 3x10 Shoulder abduction 3x10 Shoulder external rotation 2x10 Cues needed for maximizing shoulder AROM vs compensatory techniques, with education provided about keeping elbow straight vs bent to increase tolerance of weight of arm and to lift at shoulder vs elbow.  More exercises added to HEP however ran out of time to complete with plan to review in future session. Pt was left seated in w/c, with no increase in pain reported, chair alarm on and all needs in reach at end of session.  Therapy Documentation Precautions:  Precautions Precautions: Back, Fall Precaution Comments: orthostatic hypotension 5/15 Required Braces or  Orthoses: Sling, Other Brace Other Brace: LSO for OOB, sling for comfort (allowed to move LUE) Restrictions Weight Bearing Restrictions: Yes LUE Weight Bearing: Weight bearing as tolerated RLE Weight Bearing: Non weight bearing    Therapy/Group: Individual Therapy  [name] Adama Ivins 01/13/2022, 7:51 AM

## 2022-01-13 NOTE — Progress Notes (Signed)
Physical Therapy Weekly Progress Note  Patient Details  Name: Terri Drake MRN: [id number] Date of Birth: 21-Dec-1960  Beginning of progress report period: Jan 05, 2022 End of progress report period: Jan 13, 2022  Patient has met 2 of 3 short term goals. Pt demonstrates gradual progress towards long term goals. Pt is currently able to perform bed mobility with supervision, transfers with L PFRW and CGA, and ambulate up to 53f with L PFRW and CGA/light min A. Pt continues to be limited by low BP, generalized weakness, and decreased balance strategies due to RLE NWB precautions. Pt's husband, TOctavia Bruckner has been present for multiple hands on family education training sessions in preparation for discharge next week.   Patient continues to demonstrate the following deficits muscle weakness, decreased cardiorespiratoy endurance, and decreased standing balance, decreased postural control, decreased balance strategies, and difficulty maintaining precautions and therefore will continue to benefit from skilled PT intervention to increase functional independence with mobility.  Patient progressing toward long term goals..  Continue plan of care.  PT Short Term Goals Week 1:  PT Short Term Goal 1 (Week 1): pt will transfer bed<>chair with LRAD and supervision PT Short Term Goal 1 - Progress (Week 1): Progressing toward goal PT Short Term Goal 2 (Week 1): pt will ambulate 534fwith LRAD and min A PT Short Term Goal 2 - Progress (Week 1): Met PT Short Term Goal 3 (Week 1): pt will perform simulated car transfer with LRAD and CGA PT Short Term Goal 3 - Progress (Week 1): Met Week 2:  PT Short Term Goal 1 (Week 2): STG=LTG due to LOS  Skilled Therapeutic Interventions/Progress Updates:  Ambulation/gait training;Discharge planning;Functional mobility training;Psychosocial support;Therapeutic Activities;Visual/perceptual remediation/compensation;Balance/vestibular training;Disease  management/prevention;Neuromuscular re-education;Skin care/wound management;Therapeutic Exercise;Wheelchair propulsion/positioning;DME/adaptive equipment instruction;Pain management;Splinting/orthotics;UE/LE Strength taining/ROM;Community reintegration;Patient/family education;Stair training;UE/LE Coordination activities   Therapy Documentation Precautions:  Precautions Precautions: Back, Fall Precaution Comments: orthostatic hypotension 5/15 Required Braces or Orthoses: Sling, Other Brace Other Brace: LSO for OOB, sling for comfort (allowed to move LUE) Restrictions Weight Bearing Restrictions: Yes LUE Weight Bearing: Weight bearing as tolerated RLE Weight Bearing: Non weight bearing  Therapy/Group: Individual Therapy AnAlfonse AlpersT, DPT  01/13/2022, 6:50 AM

## 2022-01-13 NOTE — Progress Notes (Signed)
Physical Therapy Session Note  Patient Details  Name: Terri Drake MRN: [id number] Date of Birth: 1961-02-01  Today's Date: 01/13/2022 PT Individual Time: 0730-0840 and 1400-1500  PT Individual Time Calculation (min): 70 min and 60 min  Short Term Goals: Week 1:  PT Short Term Goal 1 (Week 1): pt will transfer bed<>chair with LRAD and supervision PT Short Term Goal 1 - Progress (Week 1): Progressing toward goal PT Short Term Goal 2 (Week 1): pt will ambulate 53f with LRAD and min A PT Short Term Goal 2 - Progress (Week 1): Met PT Short Term Goal 3 (Week 1): pt will perform simulated car transfer with LRAD and CGA PT Short Term Goal 3 - Progress (Week 1): Met Week 2:  PT Short Term Goal 1 (Week 2): STG=LTG due to LOS  Skilled Therapeutic Interventions/Progress Updates:  Treatment Session 1 Received pt semi-reclined in bed with RN present administering medications. Pt agreeable to PT treatment and reported pain 3/10 in L shoulder - pt declined any pain medications. Session with emphasis on functional mobility/transfers, toileting, dressing, generalized strengthening and endurance, dynamic standing balance/coordination, and gait training. Pt's equipment delivered - adjusted L PFRW and WC but noted no cushion or anti-tippers - [place] Pt transferred semi-reclined<>sitting EOB with HOB elevated and supervision. Pt transferred on/off bedside commode with L PFRW and CGA. Pt able to void and performed hygiene management with mod I. Sitting EOB, pt donned underwear, pants, shoes and shirt with set up assist and transferred into new WC with L PFRW and CGA.  Pt performed WC mobility 1041fx 2 trials using BUE and LLE mod I to/from dayroom. Sit<>stand with L PFRW and CGA and pt ambulated 1755f 2 and [id number] with L PFRW and CGA with close WC follow. Focus on gait quality and technique and clearance of LLE with cues to "exhale on exertion". [facility] all  needs within reach.   Orthostatics: Sitting in [place]: trial 1: 143/88, trial 2: 130/93 with HR 73bpm, trial 3: 133/74 Sitting after ambulating trial 1: 121/84 HR 85bpm Sitting after ambulating trial 2: 106/82 HR 172bpm decreasing to 60bpm with 1 minute seated rest Sitting after ambulating trial 3: 99/64   Treatment Session 2 Received pt sitting in WC, pt agreeable to PT treatment, and reported pain 2/10 in L shoulder. Adjusted pt's legrests and built up R elevating legrest with towels and coband. CSW arrived and therapist notified CSW of need for anti-tippers and standard cushion. Session with emphasis on functional mobility/transfers, generalized strengthening and endurance, dynamic standing balance/coordination, and gait training. Pt performed WC mobility 100ft60f trials using BUE and LLE with supervision/mod I to/from dayroom. Sit<>stands with L PFRW and CGA/close supervision throughout session and pt ambulated 30ft 105fand 32ft x46fith L PFRW and CGA with close WC follow, demonstrating good adherence to RLE NWB precautions. Worked on dynamic standing balance and standing tolerance tossing beanbags using LUE with Terri for balance with CGA/min A for balance overall. Pt able to stand 1 minute and 52 seconds on trial 1 and 2 minutes on trial 2. Pt then performed the following exercises standing with BUE support on L PFRW and CGA with emphasis on RLE strength/ROM, balance, and standing tolerance: -R hip flexion 2x12 -BP: 95/50 -R hip abduction 2x12 - BP:111/53 -R hamstring curls 2x12 - BP: 95/70 Returned to room and adjusted L PFRW for improved comfort. Concluded  session with pt sitting in [facility] with all needs within reach.   Orthostatics: Sitting at rest: 127/76 Ambulating trial 1: 98/48 HR 74bpm increasing to 113/79 with seated UE/LE large amplitude movements at rest Ambulating trial 2:96/58 HR 72bpm increasing to 122/68 with seated UE/LE large amplitude movements at rest Standing for 1  minute 52 seconds: 104/60 Standing for 2 minutes: 104/61  Therapy Documentation Precautions:  Precautions Precautions: Back, Fall Precaution Comments: orthostatic hypotension 5/15 Required Braces or Orthoses: Sling, Other Brace Other Brace: LSO for OOB, sling for comfort (allowed to move LUE) Restrictions Weight Bearing Restrictions: Yes LUE Weight Bearing: Weight bearing as tolerated RLE Weight Bearing: Non weight bearing  Therapy/Group: Individual Therapy Alfonse Alpers PT, DPT  01/13/2022, 6:52 AM

## 2022-01-13 NOTE — Progress Notes (Signed)
PROGRESS NOTE   Subjective/Complaints: HR went up in therapy and returned to normal with rest. EKG performed and did show afib- lopressor 12.5mg  given now and will restart Toprol XL HS  ROS: +dizziness with low blood pressure, +left sided scapular pain- range of motion improving, voiding well, denies SOB, fevers or chiills, +dysuria, +weakness   Objective:   No results found. No results for input(s): WBC, HGB, HCT, PLT in the last 72 hours.  No results for input(s): NA, K, CL, CO2, GLUCOSE, BUN, CREATININE, CALCIUM in the last 72 hours.    Intake/Output Summary (Last 24 hours) at 01/13/2022 1209 Last data filed at 01/13/2022 0900 Gross per 24 hour  Intake 357 ml  Output 400 ml  Net -43 ml        Physical Exam: Vital Signs Blood pressure 130/78, pulse 73, temperature 97.9 F (36.6 C), temperature source Oral, resp. rate 14, height 5\' 7"  (1.702 m), weight 88.5 kg, SpO2 97 %. Gen: no distress, normal appearing, BMI 30.56 HEENT: oral mucosa pink and moist, NCAT Cardio: irregular rhythm Chest: normal effort, normal rate of breathing Abd: soft, non-distended Ext: no edema Psych: pleasant, normal affect Skin: splint in place to right lower extremity. Bruising of left arm. Would care dressing to left knee abraison Neuro: Alert x3 and follows commands Sit to stand MinA MSK: improved range of motion of left shoulder to 90 degrees, pain imited   Assessment/Plan: 1. Functional deficits which require 3+ hours per day of interdisciplinary therapy in a comprehensive inpatient rehab setting. Physiatrist is providing close team supervision and 24 hour management of active medical problems listed below. Physiatrist and rehab team continue to assess barriers to discharge/monitor patient progress toward functional and medical goals  Care Tool:  Bathing    Body parts bathed by patient: Right arm, Left arm, Chest, Abdomen, Front  perineal area, Right upper leg, Left upper leg, Right lower leg, Left lower leg, Face, Buttocks   Body parts bathed by helper: Buttocks     Bathing assist Assist Level: Supervision/Verbal cueing     Upper Body Dressing/Undressing Upper body dressing   What is the patient wearing?: Button up shirt    Upper body assist Assist Level: Set up assist    Lower Body Dressing/Undressing Lower body dressing      What is the patient wearing?: Underwear/pull up, Pants     Lower body assist Assist for lower body dressing: Minimal Assistance - Patient > 75%     Toileting Toileting    Toileting assist Assist for toileting: Contact Guard/Touching assist     Transfers Chair/bed transfer  Transfers assist     Chair/bed transfer assist level: Contact Guard/Touching assist     Locomotion Ambulation   Ambulation assist   Ambulation activity did not occur: Safety/medical concerns  Assist level: Contact Guard/Touching assist Assistive device: Walker-platform Max distance: 28ft   Walk 10 feet activity   Assist  Walk 10 feet activity did not occur: Safety/medical concerns (low BP, decreased balance, pain)  Assist level: Contact Guard/Touching assist Assistive device: Walker-rolling   Walk 50 feet activity   Assist Walk 50 feet with 2 turns activity did not occur: Safety/medical concerns (  low BP, decreased balance, pain)         Walk 150 feet activity   Assist Walk 150 feet activity did not occur: Safety/medical concerns (low BP, decreased balance, pain)         Walk 10 feet on uneven surface  activity   Assist Walk 10 feet on uneven surfaces activity did not occur: Safety/medical concerns (low BP, decreased balance, pain)         Wheelchair     Assist Is the patient using a wheelchair?: Yes Type of Wheelchair: Manual Wheelchair activity did not occur: Safety/medical concerns (low BP)  Wheelchair assist level: Independent Max wheelchair  distance: 150    Wheelchair 50 feet with 2 turns activity    Assist    Wheelchair 50 feet with 2 turns activity did not occur: Safety/medical concerns (low BP)   Assist Level: Independent   Wheelchair 150 feet activity     Assist  Wheelchair 150 feet activity did not occur: Safety/medical concerns (low BP)   Assist Level: Supervision/Verbal cueing   Blood pressure 130/78, pulse 73, temperature 97.9 F (36.6 C), temperature source Oral, resp. rate 14, height 5\' 7"  (1.702 m), weight 88.5 kg, SpO2 97 %.  Medical Problem List and Plan: 1. Functional deficits secondary to closed right trimalleolar ankle fracture dislocation status post ORIF and closed left scapular body fracture nonoperative with conservative care.  Nonweightbearing right lower extremity x8 weeks with splint x2 weeks then convert to CAM for range of motion exercises.  Weightbearing as tolerated left upper extremity with sling for comfort.             -patient may not shower             -ELOS/Goals: 10-14 days modI          Continue CIR PT and OT 2.  Atrial fibrillation: Vascular study reviewed and is negative for clot.  Convert to Xarelto 10 mg daily x30 days now to minimize injections. Giver 12.5 lopressor now for afib on EKG and restart Toprol XL HS. Referred for outpatient cardiology, repeat EKG tomorrow morning. Will ask cardiology to follow this weekend.              -antiplatelet therapy: N/A 3. Pain: continue Lidoderm patch, Robaxin 1000 mg 4 times daily, oxycodone as needed. Discussed amitriptyline at night but she refuses given potential side effects 4. Insomnia: Continue 5-10mg  oxycodone HS prn to help her sleep. Discussed amitriptyline but patient defers.  5. Neuropsych: This patient is capable of making decisions on her own behalf. 6. Skin/Wound Care: Routine skin checks 7. Fluids/Electrolytes/Nutrition: Routine in and outs with follow-up chemistries 8.  T11/12 endplate fracture.  Lumbar corset as  directed per Dr. Conchita ParisNundkumar 9.  Left rib fractures 2 and 6.  Conservative care 10.  Urinary retention.  Discontinue [name].  12.  Constipation.  MiraLAX daily, Colace 100 mg twice daily. Start magnesium gluconate 250mg  HS. Check magnesium level tomorrow morning.  13. Vitamin D deficiency: Level is 17: started ergocalciferol 50,000U once per week every 7 weeks on 5/17.  14. Transaminitis: Tylenol changed to PRN, repeat CMP Monday.  15. Hypotension: abdominal binder and TEDs ordered for therapy- did not need today since AM afib meds were held.  16. Bradycardia: Toprol changed to 25mg  XL HS.   -5/22 Pulse last check 69, stable continue to follow 17. Left scapular fracture with worsening pain: MRI ordered and results discussed with patient 18. Hypotension: improved, continue abdominal binder and TEDs.   -  Cardiology recommending increased salt intake, continue to monitor  -discussed with cardiology weaning off lopressor given hypotension with therapy but in afib again today on EKG, repeat EKG tomorrow, reconsulting cardiology, and restarting Toprol XL HS.  Discussed midodrine 2.5mg  prior to therapy to help with hypotension but patient defers at this time.  19. E coli UTI: start keflex    LOS: 9 days A FACE TO FACE EVALUATION WAS PERFORMED  Clint Bolder P Artesha Wemhoff 01/13/2022, 12:09 PM

## 2022-01-13 NOTE — Discharge Summary (Signed)
Physician Discharge Summary  Patient ID: Terri Drake MRN: [id number] DOB/AGE: 62-Mar-1962 62 y.o.  Admit date: 01/04/2022 Discharge date: 01/18/2022  Discharge Diagnoses:  Principal Problem:   Trimalleolar fracture Active Problems:   PAF (paroxysmal atrial fibrillation) ([place]) T11/12 endplate fracture Left rib fractures Urinary retention Constipation Hypotension/bradycardia Left scapular fracture UTI  Discharged Condition: Stable  Significant Diagnostic Studies: DG Knee 2 Views Right  Result Date: 12/27/2021 CLINICAL DATA:  Rule out fracture. EXAM: RIGHT KNEE - 1-2 VIEW COMPARISON:  None Available. FINDINGS: There is no acute fracture no dislocation. The bones are well mineralized. No significant arthritic changes. No joint effusion. The soft tissues are unremarkable IMPRESSION: Negative. Electronically Signed   By: Anner Crete M.D.   On: 12/27/2021 22:37   DG Ankle Complete Right  Result Date: 12/29/2021 CLINICAL DATA:  Postop EXAM: RIGHT ANKLE - COMPLETE 3 VIEW COMPARISON:  Radiograph dated Dec 28, 2021 FINDINGS: Postsurgical changes from plate and screw fixation of the distal fibula and screws of medial malleolus. Hardware components appear in their expected alignment. Comminuted trimalleolar fractures with improved anatomic alignment. No new periprosthetic fracture is identified although overlying splint material somewhat limits evaluation. Expected postoperative changes within the overlying soft tissues. IMPRESSION: Postsurgical changes of ankle fracture ORIF. Electronically Signed   By: Yetta Glassman M.D.   On: 12/29/2021 13:44   DG Ankle Complete Right  Result Date: 12/29/2021 CLINICAL DATA:  Intraoperative fluoroscopy. Open reduction internal fixation upright ankle fracture. EXAM: RIGHT ANKLE - COMPLETE 3+ VIEW COMPARISON:  Right ankle radiographs 12/28/2021 and CT right ankle 12/27/2021 FINDINGS: Images were performed intraoperatively without the presence of a  radiologist. The patient is undergoing lateral plate and screw fixation of the distal fibula. Distal tibiofibular syndesmosis fixation tight rope. Two screws traverse the medial malleolus. Improved alignment of the previously seen comminuted displaced trimalleolar fractures. Total fluoroscopy images: 5 Total fluoroscopy time: 35 seconds Total dose: Radiation Exposure Index (as provided by the fluoroscopic device): 0.53 mGy air Kerma Please see intraoperative findings for further detail. IMPRESSION: Operative fluoroscopy for ORIF of the medial and lateral malleoli. Improved alignment. Electronically Signed   By: Yvonne Kendall M.D.   On: 12/29/2021 12:12   DG Ankle Complete Right  Result Date: 12/28/2021 CLINICAL DATA:  Reduction of ankle fracture dislocation. EXAM: RIGHT ANKLE - COMPLETE 3+ VIEW COMPARISON:  Radiographs 12/27/2021 FINDINGS: Limited bony detail due to the overlying plaster splint. Reduction of tibiotalar dislocation. The ankle mortise is normal. Good position of the medial malleolus fracture. Improved position of the posterior tibial fracture and the distal fibular shaft fracture. IMPRESSION: Reduction of tibiotalar dislocation and improved alignment of the fractures. Electronically Signed   By: Marijo Sanes M.D.   On: 12/28/2021 14:03   DG Ankle Complete Right  Result Date: 12/27/2021 CLINICAL DATA:  Fracture, postreduction. EXAM: RIGHT ANKLE - COMPLETE 3+ VIEW COMPARISON:  Radiograph earlier today. FINDINGS: Improved alignment of trimalleolar fracture postreduction. Improved mortise alignment with minimal residual displacement. Overlying splint material in place which limits osseous and soft tissue fine detail. IMPRESSION: Improved alignment of trimalleolar fracture postreduction. Electronically Signed   By: Keith Rake M.D.   On: 12/27/2021 19:24   CT Head Wo Contrast  Result Date: 12/27/2021 CLINICAL DATA:  Head trauma, moderate to severe. Struck by a tree branch. Obvious deformity  of the right ankle and left shoulder/arm. EXAM: CT HEAD WITHOUT CONTRAST TECHNIQUE: Contiguous axial images were obtained from the base of the skull through the vertex without intravenous contrast. RADIATION  DOSE REDUCTION: This exam was performed according to the departmental dose-optimization program which includes automated exposure control, adjustment of the mA and/or kV according to patient size and/or use of iterative reconstruction technique. COMPARISON:  None Available. FINDINGS: Brain: The ventricles are normal in size and configuration. The basilar cisterns are patent. No mass, mass effect, or midline shift. No acute intracranial hemorrhage is seen. No abnormal extra-axial fluid collection. Preservation of the normal cortical gray-white interface without CT evidence of an acute major vascular territorial cortical based infarction. Vascular: No hyperdense vessel or unexpected calcification. Skull: Normal. Negative for fracture or focal lesion. Sinuses/Orbits: The visualized orbits are unremarkable. Mild inferior left maxillary sinus mucosal opacification. The visualized mastoid air cells are clear. Other: None. IMPRESSION: No acute intracranial process. Mild inferior left maxillary sinus mucosal opacification. Electronically Signed   By: Yvonne Kendall M.D.   On: 12/27/2021 18:51   CT Cervical Spine Wo Contrast  Result Date: 12/27/2021 CLINICAL DATA:  Trauma. EXAM: CT CERVICAL SPINE WITHOUT CONTRAST TECHNIQUE: Multidetector CT imaging of the cervical spine was performed without intravenous contrast. Multiplanar CT image reconstructions were also generated. RADIATION DOSE REDUCTION: This exam was performed according to the departmental dose-optimization program which includes automated exposure control, adjustment of the mA and/or kV according to patient size and/or use of iterative reconstruction technique. COMPARISON:  Cervical spine radiographs 11/29/2017 FINDINGS: Alignment: There is 1-2 mm grade 1  anterolisthesis of C5 on C6, 2 mm grade 1 anterolisthesis of C6 on C7, and 1-2 mm grade 1 anterolisthesis of C7 on T1, the atlantodens interval is intact with mild-to-moderate degenerative change. The facet joints are appropriately aligned. Unchanged from prior 11/29/2017 radiographs. Skull base and vertebrae: Vertebral body heights are maintained. Minimal anterior C5-6 endplate spurring with tiny ossicle anterior to the C5-6 disc space. Minimal posterior C5-6: Mild-to-moderate diffuse C7-T1 and mild diffuse T1-2 disc space narrowing. No acute fracture is seen. Soft tissues and spinal canal: No prevertebral fluid or swelling. No visible canal hematoma. Disc levels: Multilevel degenerative changes including disc space narrowing, uncovertebral hypertrophy, and facet joint hypertrophy contribute to moderate right and mild left C5-6, mild-to-moderate right and mild left C6-7, and mild left C7-T1 neuroforaminal narrowing. No significant central canal stenosis. Upper chest: There is ground-glass opacification within the lung apices. Other: There is mild scattered air within the soft tissues of the predominantly posterolateral left superior hemithorax peripheral extrapleural space. Tiny foci of air seen within the left subclavian vein (axial series 6 images 94 through 98). This may be related to placement of an IV. Note is made that IV contrast was utilized for contemporaneous CT chest abdomen and pelvis. IMPRESSION:: IMPRESSION: 1. No acute fracture. 2. Multilevel degenerative disc and joint changes as above. 3. Tiny foci of air superior to the posterolateral left lung. Correlating with contemporaneous CT of the chest, there appears to be a nondisplaced left second rib fracture, and there is a displaced left scapular fracture. These findings likely relate to a superior left pleural hematoma. These findings were discussed with Dr. Teressa Lower by Dr. Donavan Foil at time of interpretation of the contemporaneous CT of the  chest. 4. Tiny foci of air within the nondependent aspect of the left subclavian vein, likely incidental from placement of an IV. Electronically Signed   By: Yvonne Kendall M.D.   On: 12/27/2021 19:09   CT Ankle Right Wo Contrast  Result Date: 12/27/2021 CLINICAL DATA:  Ankle trauma, fracture, xray done (Age >= 5y) EXAM: CT OF THE RIGHT ANKLE  WITHOUT CONTRAST TECHNIQUE: Multidetector CT imaging of the right ankle was performed according to the standard protocol. Multiplanar CT image reconstructions were also generated. RADIATION DOSE REDUCTION: This exam was performed according to the departmental dose-optimization program which includes automated exposure control, adjustment of the mA and/or kV according to patient size and/or use of iterative reconstruction technique. COMPARISON:  Radiographs earlier today FINDINGS: Bones/Joint/Cartilage Comminuted and displaced distal fibular fracture at and above the level of the ankle mortise. The fibular shaft is perched on the lateral aspect of the talar dome. Transverse medial malleolar fracture at the level of the ankle mortise. Distal fracture fragment remains aligned with the talus. Displaced posterior tibial tubercle fracture. There multiple small fracture fragments the articular surface. There is mild lateral and posterior displacement of the talus with respect to the tibial plafond. Slight decreased density of the lateral talar dome suspicious for impaction injury. The remainder of the hindfoot and midfoot is intact. Ligaments Suboptimally assessed by CT. Muscles and Tendons The Achilles tendon appears intact. There is no peroneal tendon entrapment. The tibialis posterior tendon is mildly indistinct, but intact distal to the fracture. Flexor digitorum longus tendon is also mildly indistinct. There is stranding adjacent to the extensor digitorum longus tendon. Soft tissues Generalized soft tissue edema. IMPRESSION: 1. Displaced and comminuted trimalleolar fracture as  described. 2. Slight decreased density of the lateral talar dome suspicious for impaction injury. Electronically Signed   By: Narda Rutherford M.D.   On: 12/27/2021 21:44   DG Pelvis Portable  Result Date: 12/27/2021 CLINICAL DATA:  Struck by tree branch. EXAM: PORTABLE PELVIS 1-2 VIEWS COMPARISON:  None Available. FINDINGS: There is no evidence of pelvic fracture or diastasis. No pelvic bone lesions are seen. IMPRESSION: Negative. Electronically Signed   By: Paulina Fusi M.D.   On: 12/27/2021 17:58   MR SHOULDER LEFT WO CONTRAST  Result Date: 01/07/2022 CLINICAL DATA:  Left shoulder pain.  Known left scapular fracture EXAM: MRI OF THE LEFT SHOULDER WITHOUT CONTRAST TECHNIQUE: Multiplanar, multisequence MR imaging of the shoulder was performed. No intravenous contrast was administered. COMPARISON:  X-ray 12/27/2021 FINDINGS: Rotator cuff: The supraspinatus, infraspinatus, subscapularis, and teres minor tendons are intact. Muscles: Prominent intramuscular edema within the subscapularis and infraspinatus muscles adjacent to the scapular fracture site as well as within the teres major muscle. Areas of distortion of the normal muscle architecture with small intramuscular fluid collections compatible with traumatic muscle tears and multiple small intramuscular hematomas. For reference, two subcentimeter intramuscular hematomas are seen within the infraspinatus muscle belly (series 5, images 4 and 9). No muscle atrophy or fatty infiltration. Biceps long head: Intact and appropriately positioned. Small amount of fluid in the biceps tendon sheath. Acromioclavicular Joint: Mild-moderate degenerative changes of the AC joint. No significant subacromial-subdeltoid bursal fluid. Glenohumeral Joint: No joint effusion. No cartilage defect. Labrum: Grossly intact although evaluation is limited in the absence of intra-articular fluid. No paralabral cyst. Bones: Acute fracture involving the infraspinous aspect of the scapular  body with approximately 1.0 cm of posterior displacement. No fracture involvement of the glenoid. Proximal humerus intact. Glenohumeral joint alignment is maintained without dislocation. No evidence of a underlying bone lesion. Other: Prominent soft tissue edema surrounding the fracture site. IMPRESSION: 1. Acute moderately displaced fracture of the left scapular body. 2. Prominent intramuscular edema surrounding the scapular fracture site. Associated traumatic muscle tears with multiple small intramuscular hematomas, most pronounced within the infraspinatus and subscapularis muscles. 3. Intact rotator cuff. Electronically Signed   By: Duanne Guess  D.O.   On: 01/07/2022 14:59   CT CHEST ABDOMEN PELVIS W CONTRAST  Result Date: 12/27/2021 CLINICAL DATA:  Hit by tree branch EXAM: CT CHEST, ABDOMEN, AND PELVIS WITH CONTRAST TECHNIQUE: Multidetector CT imaging of the chest, abdomen and pelvis was performed following the standard protocol during bolus administration of intravenous contrast. RADIATION DOSE REDUCTION: This exam was performed according to the departmental dose-optimization program which includes automated exposure control, adjustment of the mA and/or kV according to patient size and/or use of iterative reconstruction technique. CONTRAST:  129mL OMNIPAQUE IOHEXOL 300 MG/ML  SOLN COMPARISON:  Chest x-ray 12/27/2021 FINDINGS: CT CHEST FINDINGS Cardiovascular: Nonaneurysmal aorta. Normal cardiac size. No pericardial effusion. Normal aortic contour. Mediastinum/Nodes: Midline trachea. No thyroid mass. Esophagus within normal limits. No suspicious lymph nodes Lungs/Pleura: Mild left apical pleural thickening. Minimal foci of gas in the pleural space at left apex without sizable pneumothorax. No consolidation or pleural effusion. Musculoskeletal: Sternum is intact. Acute mildly displaced inferior scapular fracture. Small amount of gas between the left first and second ribs with findings suspicious for  nondisplaced left second rib fracture on sagittal reconstructions. Edema within the subcutaneous posterior soft tissues overlying the shoulder. CT ABDOMEN PELVIS FINDINGS Hepatobiliary: Subcentimeter hypodensities too small to further characterize. No calcified gallstone. No biliary dilatation Pancreas: Unremarkable. No pancreatic ductal dilatation or surrounding inflammatory changes. Spleen: Normal in size without focal abnormality. Adrenals/Urinary Tract: Adrenal glands are unremarkable. Kidneys are normal, without renal calculi, focal lesion, or hydronephrosis. Bladder is unremarkable. Stomach/Bowel: Stomach is within normal limits. Appendix appears normal. No evidence of bowel wall thickening, distention, or inflammatory changes. Vascular/Lymphatic: Mild atherosclerosis. No aneurysm. No suspicious lymph nodes Reproductive: Uterus and bilateral adnexa are unremarkable. Other: Negative for pelvic effusion or free air Musculoskeletal: No acute osseous abnormality. Thoracic and lumbar spine are dictated separately. IMPRESSION: 1. No CT evidence for acute mediastinal injury. No CT evidence for acute solid organ injury within abdomen pelvis. No free air is seen. 2. Acute displaced left inferior scapular fracture. Acute nondisplaced left second rib fracture with small amount of soft tissue gas between the first and second ribs on the left side. Mild left apical pleural thickening suspect secondary to small hematoma from adjacent rib trauma. Trace left apical pneumothorax. Critical Value/emergent results were called by telephone at the time of interpretation on 12/27/2021 at 7:07 pm to provider [facility] , who verbally acknowledged these results. Electronically Signed   By: Donavan Foil M.D.   On: 12/27/2021 19:07   CT T-SPINE NO CHARGE  Result Date: 12/27/2021 CLINICAL DATA:  Struck by tree branch EXAM: CT Thoracic and Lumbar spine without contrast TECHNIQUE: Multiplanar CT images of the thoracic and lumbar  spine were reconstructed from contemporary CT of the Chest, Abdomen, and Pelvis. RADIATION DOSE REDUCTION: This exam was performed according to the departmental dose-optimization program which includes automated exposure control, adjustment of the mA and/or kV according to patient size and/or use of iterative reconstruction technique. CONTRAST:  None or No additional COMPARISON:  None Available. FINDINGS: CT THORACIC SPINE FINDINGS Alignment: Normal. Vertebrae: Minimal superior endplate deformities at T11 and T12. Slight cortical buckling laterally of the T12 vertebral body, with mild adjacent edema suggestive of acute fracture. Vertebral body heights are maintained. Paraspinal and other soft tissues: Tiny left apical pneumothorax. Small foci of gas between the left first and second ribs. Acute nondisplaced left second rib fracture. Acute nondisplaced left sixth posterior rib fracture. Disc levels: Mild degenerative osteophytes. No abnormal disc space widening or narrowing. CT  LUMBAR SPINE FINDINGS Segmentation: 5 lumbar type vertebrae. Alignment: Normal. Vertebrae: No acute fracture or focal pathologic process. Paraspinal and other soft tissues: Negative. Disc levels: No significant canal stenosis or foraminal narrowing at L1-L2 and L2-L3. At L3-L4, maintained disc space. Hypertrophic facet degenerative change. The foramen are patent bilaterally. At L4-L5, maintained disc space. Hypertrophic facet degenerative changes. At least mild canal stenosis. Mild bilateral foraminal narrowing. At L5-S1, advanced disc space narrowing with endplate sclerosis and vacuum discs. Posterior central disc osteophyte complex with mass effect on thecal sac. No high-grade canal stenosis. Hypertrophic facet degenerative changes. Moderate bilateral foraminal narrowing by bony spurring. IMPRESSION: 1. Possible acute mild superior endplate fractures at 624THL and T12, correlate for level of tenderness. 2. No CT evidence for acute osseous  abnormality of the lumbar spine 3. Nondisplaced left second and sixth rib fractures. Trace left apical pneumothorax Electronically Signed   By: Donavan Foil M.D.   On: 12/27/2021 19:22   CT L-SPINE NO CHARGE  Result Date: 12/27/2021 CLINICAL DATA:  Struck by tree branch EXAM: CT Thoracic and Lumbar spine without contrast TECHNIQUE: Multiplanar CT images of the thoracic and lumbar spine were reconstructed from contemporary CT of the Chest, Abdomen, and Pelvis. RADIATION DOSE REDUCTION: This exam was performed according to the departmental dose-optimization program which includes automated exposure control, adjustment of the mA and/or kV according to patient size and/or use of iterative reconstruction technique. CONTRAST:  None or No additional COMPARISON:  None Available. FINDINGS: CT THORACIC SPINE FINDINGS Alignment: Normal. Vertebrae: Minimal superior endplate deformities at T11 and T12. Slight cortical buckling laterally of the T12 vertebral body, with mild adjacent edema suggestive of acute fracture. Vertebral body heights are maintained. Paraspinal and other soft tissues: Tiny left apical pneumothorax. Small foci of gas between the left first and second ribs. Acute nondisplaced left second rib fracture. Acute nondisplaced left sixth posterior rib fracture. Disc levels: Mild degenerative osteophytes. No abnormal disc space widening or narrowing. CT LUMBAR SPINE FINDINGS Segmentation: 5 lumbar type vertebrae. Alignment: Normal. Vertebrae: No acute fracture or focal pathologic process. Paraspinal and other soft tissues: Negative. Disc levels: No significant canal stenosis or foraminal narrowing at L1-L2 and L2-L3. At L3-L4, maintained disc space. Hypertrophic facet degenerative change. The foramen are patent bilaterally. At L4-L5, maintained disc space. Hypertrophic facet degenerative changes. At least mild canal stenosis. Mild bilateral foraminal narrowing. At L5-S1, advanced disc space narrowing with  endplate sclerosis and vacuum discs. Posterior central disc osteophyte complex with mass effect on thecal sac. No high-grade canal stenosis. Hypertrophic facet degenerative changes. Moderate bilateral foraminal narrowing by bony spurring. IMPRESSION: 1. Possible acute mild superior endplate fractures at 624THL and T12, correlate for level of tenderness. 2. No CT evidence for acute osseous abnormality of the lumbar spine 3. Nondisplaced left second and sixth rib fractures. Trace left apical pneumothorax Electronically Signed   By: Donavan Foil M.D.   On: 12/27/2021 19:22   DG CHEST PORT 1 VIEW  Result Date: 01/02/2022 CLINICAL DATA:  Hypotension EXAM: PORTABLE CHEST 1 VIEW COMPARISON:  Chest x-ray 12/28/2021 FINDINGS: Heart size and mediastinum are stable and within normal limits. Pulmonary vasculature appears normal. Opacities at the left lung base likely represent small pleural effusion with associated atelectasis/infiltrate. No pneumothorax. IMPRESSION: Small left pleural effusion with associated atelectasis/infiltrate. Electronically Signed   By: Ofilia Neas M.D.   On: 01/02/2022 13:28   DG Chest Port 1 View  Result Date: 12/28/2021 CLINICAL DATA:  Evaluate pneumothorax. EXAM: PORTABLE CHEST 1 VIEW COMPARISON:  CT chest 12/27/2021. FINDINGS: Stable cardiomediastinal contours. No signs of pleural effusion or edema. No airspace opacities identified. No visible pneumothorax identified. IMPRESSION: No active disease.  No pneumothorax identified. Electronically Signed   By: Kerby Moors M.D.   On: 12/28/2021 06:46   DG Chest Port 1 View  Result Date: 12/27/2021 CLINICAL DATA:  Level 2 trauma.  Struck by tree branch. EXAM: PORTABLE CHEST 1 VIEW COMPARISON:  04/03/2021 FINDINGS: Heart size is normal. The right chest is clear. Question hazy opacity in the left lung that could be contusion or aspiration. I do not see a pneumothorax or hemothorax. No left rib fracture or scapular region fracture is seen.  Old deformity of the distal clavicle. IMPRESSION: No definite acute finding. Question hazy/patchy density in the left lung that could be contusion or aspiration. No other finding. Electronically Signed   By: Nelson Chimes M.D.   On: 12/27/2021 17:57   DG Shoulder Left Port  Result Date: 12/27/2021 CLINICAL DATA:  Trauma, struck by a tree branch. Left arm and shoulder pain. EXAM: LEFT SHOULDER COMPARISON:  Included portion from chest CT earlier today FINDINGS: Comminuted left scapular body fracture was better delineated on chest CT earlier today. There is no extension to the glenohumeral joint. No additional fracture. Normal shoulder alignment. IMPRESSION: Comminuted left scapular body fracture. No additional fracture of the left shoulder. Electronically Signed   By: Keith Rake M.D.   On: 12/27/2021 19:21   DG Knee Left Port  Result Date: 12/27/2021 CLINICAL DATA:  Trauma, struck by a tree branch. EXAM: PORTABLE LEFT KNEE - 1-2 VIEW COMPARISON:  None Available. FINDINGS: No evidence of fracture, dislocation, or joint effusion. Trace patellar spurring. No other evidence of arthropathy or other focal bone abnormality. Soft tissues are unremarkable. IMPRESSION: No fracture or subluxation of the left knee. Electronically Signed   By: Keith Rake M.D.   On: 12/27/2021 19:22   DG Ankle Right Port  Result Date: 12/27/2021 CLINICAL DATA:  Struck by tree branch.  Pain and deformity. EXAM: PORTABLE RIGHT ANKLE - 2 VIEW COMPARISON:  None FINDINGS: Fracture dislocation at the ankle joint. Comminuted transverse fracture of the medial malleolus. Oblique fracture of the distal fibula. Fracture of the posterior lip of the tibia. Talus is displaced laterally and posteriorly. No fracture of the talus is seen. IMPRESSION: Trimalleolar fracture dislocation as above. Electronically Signed   By: Nelson Chimes M.D.   On: 12/27/2021 17:59   DG Humerus Left  Result Date: 12/27/2021 CLINICAL DATA:  Trauma, struck by a  tree branch. Left shoulder and arm pain. EXAM: LEFT HUMERUS - 2+ VIEW COMPARISON:  None Available. FINDINGS: The cortical margins of the humerus are intact. There is no evidence of fracture or other focal bone lesions. Elbow alignment is maintained. Soft tissues are unremarkable. IMPRESSION: No fracture of the left humerus. Electronically Signed   By: Keith Rake M.D.   On: 12/27/2021 19:23   DG C-Arm 1-60 Min-No Report  Result Date: 12/29/2021 Fluoroscopy was utilized by the requesting physician.  No radiographic interpretation.   DG C-Arm 1-60 Min-No Report  Result Date: 12/29/2021 Fluoroscopy was utilized by the requesting physician.  No radiographic interpretation.   ECHOCARDIOGRAM COMPLETE  Result Date: 01/02/2022    ECHOCARDIOGRAM REPORT   Patient Name:   MYELLE KALAMA Date of Exam: 01/02/2022 Medical Rec #:  [id number]         Height:       67.0 in Accession #:    [id number]  Weight:       191.0 lb Date of Birth:  1960-11-07          BSA:          1.983 m Patient Age:    56 years          BP:           157/57 mmHg Patient Gender: F                 HR:           78 bpm. Exam Location:  Inpatient Procedure: 2D Echo STAT ECHO Indications:    chest trauma  History:        Patient has prior history of Echocardiogram examinations, most                 recent 02/24/2021. Arrythmias:Atrial Fibrillation.  Sonographer:    Johny Chess RDCS Referring Phys: [id number] Jesusita Oka  Sonographer Comments: Restricted mobility due to left side fractures. IMPRESSIONS  1. Left ventricular ejection fraction, by estimation, is 65 to 70%. The left ventricle has hyperdynamic function. The left ventricle has no regional wall motion abnormalities. Left ventricular diastolic parameters are indeterminate.  2. Right ventricular systolic function is normal. The right ventricular size is normal. Tricuspid regurgitation signal is inadequate for assessing PA pressure.  3. The mitral valve is normal in structure.  No evidence of mitral valve regurgitation. No evidence of mitral stenosis.  4. The aortic valve is normal in structure. Aortic valve regurgitation is not visualized. No aortic stenosis is present.  5. The inferior vena cava is normal in size with greater than 50% respiratory variability, suggesting right atrial pressure of 3 mmHg. FINDINGS  Left Ventricle: Left ventricular ejection fraction, by estimation, is 65 to 70%. The left ventricle has hyperdynamic function. The left ventricle has no regional wall motion abnormalities. The left ventricular internal cavity size was normal in size. There is no left ventricular hypertrophy. Left ventricular diastolic function could not be evaluated due to atrial fibrillation. Left ventricular diastolic parameters are indeterminate. Right Ventricle: The right ventricular size is normal. No increase in right ventricular wall thickness. Right ventricular systolic function is normal. Tricuspid regurgitation signal is inadequate for assessing PA pressure. Left Atrium: Left atrial size was normal in size. Right Atrium: Right atrial size was normal in size. Pericardium: There is no evidence of pericardial effusion. Mitral Valve: The mitral valve is normal in structure. No evidence of mitral valve regurgitation. No evidence of mitral valve stenosis. Tricuspid Valve: The tricuspid valve is normal in structure. Tricuspid valve regurgitation is trivial. No evidence of tricuspid stenosis. Aortic Valve: The aortic valve is normal in structure. Aortic valve regurgitation is not visualized. No aortic stenosis is present. Pulmonic Valve: The pulmonic valve was normal in structure. Pulmonic valve regurgitation is not visualized. No evidence of pulmonic stenosis. Aorta: The aortic root is normal in size and structure. Venous: The inferior vena cava is normal in size with greater than 50% respiratory variability, suggesting right atrial pressure of 3 mmHg. IAS/Shunts: No atrial level shunt  detected by color flow Doppler.  LEFT VENTRICLE PLAX 2D LVIDd:         4.20 cm   Diastology LVIDs:         2.40 cm   LV e' medial:   8.55 cm/s LV PW:         1.00 cm   LV E/e' medial: 7.3 LV IVS:  0.90 cm LVOT diam:     2.00 cm LVOT Area:     3.14 cm  RIGHT VENTRICLE RV S prime:     16.20 cm/s LEFT ATRIUM           Index LA diam:      2.30 cm 1.16 cm/m LA Vol (A4C): 34.7 ml 17.50 ml/m   AORTA Ao Root diam: 3.30 cm Ao Asc diam:  2.70 cm MITRAL VALVE MV Area (PHT): 2.77 cm    SHUNTS MV Decel Time: 274 msec    Systemic Diam: 2.00 cm MV E velocity: 62.40 cm/s MV A velocity: 67.60 cm/s MV E/A ratio:  0.92 Kardie Tobb DO Electronically signed by Berniece Salines DO Signature Date/Time: 01/02/2022/3:02:03 PM    Final    VAS [place] LOWER EXTREMITY VENOUS (DVT)  Result Date: 01/06/2022  Lower Venous DVT Study Patient Name:  LODIE ERBY  Date of Exam:   01/06/2022 Medical Rec #: [id number]          Accession #:    [id number] Date of Birth: 09-02-1960           Patient Gender: F Patient Age:   81 years Exam Location:  [facility] Procedure:      VAS [place] LOWER EXTREMITY VENOUS (DVT) Referring Phys: Lauraine Rinne --------------------------------------------------------------------------------  Indications: Swelling, s/p trauma, RT ankle ORIF.  Risk Factors: Immobility. Anticoagulation: Lovenox. Limitations: Plaster cast right ankle/calf. Comparison Study: No prior studies. Performing Technologist: Darlin Coco RDMS, RVT  Examination Guidelines: A complete evaluation includes B-mode imaging, spectral Doppler, color Doppler, and power Doppler as needed of all accessible portions of each vessel. Bilateral testing is considered an integral part of a complete examination. Limited examinations for reoccurring indications may be performed as noted. The reflux portion of the exam is performed with the patient in reverse Trendelenburg.  +---------+---------------+---------+-----------+----------+-------------------+ RIGHT     CompressibilityPhasicitySpontaneityPropertiesThrombus Aging      +---------+---------------+---------+-----------+----------+-------------------+ CFV      Full           Yes      Yes                                      +---------+---------------+---------+-----------+----------+-------------------+ SFJ      Full                                                             +---------+---------------+---------+-----------+----------+-------------------+ FV Prox  Full                                                             +---------+---------------+---------+-----------+----------+-------------------+ FV Mid   Full                                                             +---------+---------------+---------+-----------+----------+-------------------+ FV DistalFull                                                             +---------+---------------+---------+-----------+----------+-------------------+  PFV      Full                                                             +---------+---------------+---------+-----------+----------+-------------------+ POP      Full           Yes      Yes                                      +---------+---------------+---------+-----------+----------+-------------------+ PTV                                                   Unable to visualize                                                       due to cast         +---------+---------------+---------+-----------+----------+-------------------+ PERO                                                  Unable to visualize                                                       due to cast         +---------+---------------+---------+-----------+----------+-------------------+ Gastroc  Full                                                             +---------+---------------+---------+-----------+----------+-------------------+    +---------+---------------+---------+-----------+----------+--------------+ LEFT     CompressibilityPhasicitySpontaneityPropertiesThrombus Aging +---------+---------------+---------+-----------+----------+--------------+ CFV      Full           Yes      Yes                                 +---------+---------------+---------+-----------+----------+--------------+ SFJ      Full                                                        +---------+---------------+---------+-----------+----------+--------------+ FV Prox  Full                                                        +---------+---------------+---------+-----------+----------+--------------+  FV Mid   Full                                                        +---------+---------------+---------+-----------+----------+--------------+ FV DistalFull                                                        +---------+---------------+---------+-----------+----------+--------------+ PFV      Full                                                        +---------+---------------+---------+-----------+----------+--------------+ POP      Full           Yes      Yes                                 +---------+---------------+---------+-----------+----------+--------------+ PTV      Full                                                        +---------+---------------+---------+-----------+----------+--------------+ PERO     Full                                                        +---------+---------------+---------+-----------+----------+--------------+ Gastroc  Full                                                        +---------+---------------+---------+-----------+----------+--------------+     Summary: RIGHT: - There is no evidence of deep vein thrombosis in the lower extremity. However, portions of this examination were limited- see technologist comments above.  - No cystic structure  found in the popliteal fossa.  LEFT: - There is no evidence of deep vein thrombosis in the lower extremity.  - No cystic structure found in the popliteal fossa.  *See table(s) above for measurements and observations. Electronically signed by Heath Larkhomas Hawken on 01/06/2022 at 5:26:10 PM.    Final     Labs:  Basic Metabolic Panel: Recent Labs  Lab 01/14/22 0957  NA 137  K 4.3  CL 105  CO2 22  GLUCOSE 111*  BUN 14  CREATININE 0.72  CALCIUM 9.3  MG 2.2    CBC: Recent Labs  Lab 01/14/22 0957  WBC 6.6  NEUTROABS 3.8  HGB 13.6  HCT 38.3  MCV 98.0  PLT 419*    CBG: No results for input(s): GLUCAP in the last 168 hours.  Brief HPI:  Terri Drake is a 61 y.o. right-handed female with history of hypertension occasional alcohol use atrial fibrillation maintained on Tambocor as well as Toprol.  Per chart review lives with spouse 1 level home independent prior to admission.  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  Admission chemistries unremarkable alcohol negative.  Cranial CT scan as well as CT cervical spine negative.  CT thoracic spine with possible acute mild superior endplate fractures at 624THL and T12.  CT of chest abdomen pelvis showed no evidence of acute solid organ injury.  There was an acute displaced left inferior scapular fracture as well as acute nondisplaced left second rib fracture as well as sixth rib fracture with small amount of soft tissue gas between the first second ribs on the left side.  Mild left apical pleural thickening suspect secondary small hematoma with adjacent rib trauma.  Trace left apical pneumothorax.  Left shoulder film showed up identified comminuted left scapular body fracture with no additional fracture left shoulder.  CT of the right ankle identified displaced and comminuted trimalleolar fracture as well as slightly decreased density  of the lateral talar dome suspicious for impaction injury.  Underwent ORIF of right trimalleolar ankle fracture without fixation of posterior lip/ORIF of the syndesmosis 12/29/2021 per Dr. Marcelino Scot.  Nonweightbearing right lower extremity x8 weeks with splint x2 weeks then convert to cam for range of motion exercises.  In regards to patient's left scapular body fracture weightbearing as tolerated no surgical intervention.  In regards to patient's T11-12 endplate fractures discussed with Dr. Kathyrn Sheriff lumbar corset as directed.  Currently maintained on Lovenox for DVT prophylaxis converting to Xarelto 10 mg daily for 30 days for DVT prophylaxis.  Hospital course cardiology service did follow-up in regards to patient's history of atrial fibrillation intermittent RVR currently remains on flecainide as well as Lopressor monitoring for any bradycardia.  Echocardiogram with ejection fraction of 65 to 70% no wall motion abnormalities.  Bouts of urinary retention Foley tube initially placed.  Therapy evaluations completed due to patient decreased functional mobility was admitted for comprehensive rehab program.   Hospital Course: [facility] was admitted to rehab 01/04/2022 for inpatient therapies to consist of PT, ST and OT at least three hours five days a week. Past admission physiatrist, therapy team and rehab RN have worked together to provide customized collaborative inpatient rehab.  Pertaining to patient's closed right trimalleolar ankle fracture dislocation status post ORIF and closed left scapular body fracture nonoperative with conservative care.  Nonweightbearing right lower extremity x8 weeks with splint x2 weeks then convert to cam for range of motion exercises.  Weightbearing as tolerated left upper extremity sling for comfort.  She continued on Xarelto for DVT prophylaxis.  Venous Doppler studies negative.  No bleeding episodes.  Pain managed use of Lidoderm patch Robaxin scheduled oxycodone as needed.   T11-12 endplate fractures lumbar corset conservative care per Dr. Kathyrn Sheriff.  Left rib fractures 2-6 conservative care.  Atrial fibrillation follow-up cardiology services flecainide as advised as well as Lopressor which was tapered to off due to bradycardia.  Bouts of constipation resolved with laxative assistance.  She did have some hypotension as noted above follow-up per cardiology services.  Urine culture 100,000 E. coli completing course of Keflex.   Blood pressures were monitored on TID basis and soft and monitored     Rehab course: During patient's stay in  rehab weekly team conferences were held to monitor patient's progress, set goals and discuss barriers to discharge. At admission, patient required minimal assist sit to stand minimal assist stand pivot transfers  Physical exam.  Blood pressure 108/46 pulse 62 temperature 98.3 respiration 16 oxygen saturation 90% room air Constitutional.  No acute distress HEENT Head.  Normocephalic and atraumatic Eyes.  Pupils round and reactive to light no discharge nystagmus Neck.  Supple nontender no JVD without thyromegaly Cardiac regular rate rhythm at a Eksir sounds or murmur heard Abdomen.  Soft nontender positive bowel sounds without rebound Respiratory effort normal no respiratory distress without wheeze Skin.  Splint in place right lower extremity wound care dressing the left knee abrasion Neurologic.  Alert oriented x3 follows commands  He/She  has had improvement in activity tolerance, balance, postural control as well as ability to compensate for deficits. He/She has had improvement in functional use RUE/LUE  and RLE/LLE as well as improvement in awareness.  Propels wheelchair on unit independently.  Perform gait with focus on gait quality and technique and clearance of left lower extremity using assistive device.  Weightbearing as tolerated left upper extremity nonweightbearing right lower extremity.  LSO brace in place.  Needed  assistance for lower body ADLs.  Full family teaching completed plan discharged to home       Disposition: Discharged to home    Diet: Regular  Special Instructions: No driving smoking or alcohol  Nonweightbearing right lower extremity.  Weightbearing as tolerated left upper extremity.  LSO brace when out of bed  Medications at discharge 1.  Tylenol as needed 2.  Vitamin C 500 mg p.o. daily 3.  Tambocor 100 mg p.o. every 12 hours 4.Xalatan ophthalmic solution 0.05% 1 drop both eyes bedtime 5.  Lidoderm patch change as directed 6.  Magnesium gluconate 250 mg p.o. nightly 7.  Robaxin 1000 mg p.o. 4 times daily as needed muscle spasms 8.  Oxycodone 5 to 10 mg every 4 hours as needed pain 9.  Xarelto 10 mg p.o. daily until 02/11/2022 and stop 10.  Vitamin D 50,000 units every 7 days 11.  Zinc sulfate 220 mg p.o. daily  30-35 minutes were spent completing discharge summary and discharge planning  Discharge Instructions     Ambulatory referral to Physical Medicine Rehab   Complete by: As directed    Moderate complexity 1 to 2 weeks multitrauma        Follow-up Information     Raulkar, Clide Deutscher, MD Follow up.   Specialty: Physical Medicine and Rehabilitation Why: Office to call for appointment Contact information: [id number] [place] [address] [phone]         Altamese , MD Follow up.   Specialty: Orthopedic Surgery Why: Call for appointment Contact information: [place] [address] [phone]         Freada Bergeron, MD Follow up.   Specialties: Cardiology, Radiology Why: Call for appointment Contact information: [address] [place] [address] [phone]         Consuella Lose, MD Follow up.   Specialty: Neurosurgery Why: Call for appointment Contact information: [address] [place] [address] [phone]                  Signed: Lavon Paganini Redwood 01/18/2022, 5:10 AM

## 2022-01-14 DIAGNOSIS — S82851D Displaced trimalleolar fracture of right lower leg, subsequent encounter for closed fracture with routine healing: Principal | ICD-10-CM

## 2022-01-14 LAB — COMPREHENSIVE METABOLIC PANEL
ALT: 30 U/L (ref 0–44)
AST: 20 U/L (ref 15–41)
Albumin: 3.6 g/dL (ref 3.5–5.0)
Alkaline Phosphatase: 145 U/L — ABNORMAL HIGH (ref 38–126)
Anion gap: 10 (ref 5–15)
BUN: 14 mg/dL (ref 8–23)
CO2: 22 mmol/L (ref 22–32)
Calcium: 9.3 mg/dL (ref 8.9–10.3)
Chloride: 105 mmol/L (ref 98–111)
Creatinine, Ser: 0.72 mg/dL (ref 0.44–1.00)
GFR, Estimated: >60 mL/min (ref >60)
Glucose, Bld: 111 mg/dL — ABNORMAL HIGH (ref 70–99)
Potassium: 4.3 mmol/L (ref 3.5–5.1)
Sodium: 137 mmol/L (ref 135–145)
Total Bilirubin: 0.7 mg/dL (ref 0.3–1.2)
Total Protein: 6.3 g/dL — ABNORMAL LOW (ref 6.5–8.1)

## 2022-01-14 LAB — CBC WITH DIFFERENTIAL/PLATELET
Abs Immature Granulocytes: 0.02 10*3/uL (ref 0.00–0.07)
Basophils Absolute: 0.1 10*3/uL (ref 0.0–0.1)
Basophils Relative: 1 %
Eosinophils Absolute: 0.1 10*3/uL (ref 0.0–0.5)
Eosinophils Relative: 1 %
HCT: 38.3 % (ref 36.0–46.0)
Hemoglobin: 13.6 g/dL (ref 12.0–15.0)
Immature Granulocytes: 0 %
Lymphocytes Relative: 32 %
Lymphs Abs: 2.1 10*3/uL (ref 0.7–4.0)
MCH: 34.8 pg — ABNORMAL HIGH (ref 26.0–34.0)
MCHC: 35.5 g/dL (ref 30.0–36.0)
MCV: 98 fL (ref 80.0–100.0)
Monocytes Absolute: 0.5 10*3/uL (ref 0.1–1.0)
Monocytes Relative: 8 %
Neutro Abs: 3.8 10*3/uL (ref 1.7–7.7)
Neutrophils Relative %: 58 %
Platelets: 419 10*3/uL — ABNORMAL HIGH (ref 150–400)
RBC: 3.91 MIL/uL (ref 3.87–5.11)
RDW: 12.7 % (ref 11.5–15.5)
WBC: 6.6 10*3/uL (ref 4.0–10.5)
nRBC: 0 % (ref 0.0–0.2)

## 2022-01-14 LAB — MAGNESIUM: Magnesium: 2.2 mg/dL (ref 1.7–2.4)

## 2022-01-14 NOTE — Progress Notes (Signed)
PROGRESS NOTE   Subjective/Complaints:  EKG this AM showed no Afib- regular rhythm. But PAC's- questionable anterior infarct per EKG reading, however not sure if due to lead placement- rate down in 60s.   Pt reports doing better with prn metoprolol, but at home was on Flecanide 50 mg BID- here on 100 mg BID- also was on Metoprolol 25 mg daily XL- same dose here and prn metoprolol- Wants to talk with Cards about next step.   Shoulder pain 2/10 and Ankle pain 1-2/10- doesn't want any pain meds.  LBM this AM- large size.     ROS:  Pt denies SOB, abd pain, CP, N/V/C/D, and vision changes No more dizziness with standing up, but no therapy since yesterday afternoon  Objective:   No results found. No results for input(s): WBC, HGB, HCT, PLT in the last 72 hours.  No results for input(s): NA, K, CL, CO2, GLUCOSE, BUN, CREATININE, CALCIUM in the last 72 hours.    Intake/Output Summary (Last 24 hours) at 01/14/2022 0929 Last data filed at 01/13/2022 1800 Gross per 24 hour  Intake 300 ml  Output --  Net 300 ml        Physical Exam: Vital Signs Blood pressure 123/60, pulse 62, temperature 98.1 F (36.7 C), resp. rate 16, height 5\' 7"  (1.702 m), weight 88.5 kg, SpO2 97 %.    General: awake, alert, appropriate, sitting in w/c at bedside; eating breakfast; husband at side; foot elevated of RLE on bed; NAD HENT: conjugate gaze; oropharynx moist CV: regular rate and rhythm- in 60s-70s-  no JVD Pulmonary: CTA B/L; no W/R/R- good air movement GI: soft, NT, ND, (+)BS Psychiatric: appropriate- interactive Neurological: Ox3 Skin: splint in place to right lower extremity. Bruising of left arm. Would care dressing to left knee abraison Neuro: Alert x3 and follows commands Sit to stand MinA MSK: improved range of motion of left shoulder to 70 degrees,- Active ROM; can lift higher passively.  pain imited   Assessment/Plan: 1.  Functional deficits which require 3+ hours per day of interdisciplinary therapy in a comprehensive inpatient rehab setting. Physiatrist is providing close team supervision and 24 hour management of active medical problems listed below. Physiatrist and rehab team continue to assess barriers to discharge/monitor patient progress toward functional and medical goals  Care Tool:  Bathing    Body parts bathed by patient: Right arm, Left arm, Chest, Abdomen, Front perineal area, Right upper leg, Left upper leg, Right lower leg, Left lower leg, Face, Buttocks   Body parts bathed by helper: Buttocks     Bathing assist Assist Level: Supervision/Verbal cueing     Upper Body Dressing/Undressing Upper body dressing   What is the patient wearing?: Button up shirt    Upper body assist Assist Level: Set up assist    Lower Body Dressing/Undressing Lower body dressing      What is the patient wearing?: Underwear/pull up, Pants     Lower body assist Assist for lower body dressing: Minimal Assistance - Patient > 75%     Toileting Toileting    Toileting assist Assist for toileting: Contact Guard/Touching assist     Transfers Chair/bed transfer  Transfers assist  Chair/bed transfer assist level: Contact Guard/Touching assist     Locomotion Ambulation   Ambulation assist   Ambulation activity did not occur: Safety/medical concerns  Assist level: Contact Guard/Touching assist Assistive device: Walker-platform Max distance: 1532ft   Walk 10 feet activity   Assist  Walk 10 feet activity did not occur: Safety/medical concerns (low BP, decreased balance, pain)  Assist level: Contact Guard/Touching assist Assistive device: Walker-rolling   Walk 50 feet activity   Assist Walk 50 feet with 2 turns activity did not occur: Safety/medical concerns (low BP, decreased balance, pain)         Walk 150 feet activity   Assist Walk 150 feet activity did not occur:  Safety/medical concerns (low BP, decreased balance, pain)         Walk 10 feet on uneven surface  activity   Assist Walk 10 feet on uneven surfaces activity did not occur: Safety/medical concerns (low BP, decreased balance, pain)         Wheelchair     Assist Is the patient using a wheelchair?: Yes Type of Wheelchair: Manual Wheelchair activity did not occur: Safety/medical concerns (low BP)  Wheelchair assist level: Independent Max wheelchair distance: 150    Wheelchair 50 feet with 2 turns activity    Assist    Wheelchair 50 feet with 2 turns activity did not occur: Safety/medical concerns (low BP)   Assist Level: Independent   Wheelchair 150 feet activity     Assist  Wheelchair 150 feet activity did not occur: Safety/medical concerns (low BP)   Assist Level: Supervision/Verbal cueing   Blood pressure 123/60, pulse 62, temperature 98.1 F (36.7 C), resp. rate 16, height 5\' 7"  (1.702 m), weight 88.5 kg, SpO2 97 %.  Medical Problem List and Plan: 1. Functional deficits secondary to closed right trimalleolar ankle fracture dislocation status post ORIF and closed left scapular body fracture nonoperative with conservative care.  Nonweightbearing right lower extremity x8 weeks with splint x2 weeks then convert to CAM for range of motion exercises.  Weightbearing as tolerated left upper extremity with sling for comfort.             -patient may not shower             -ELOS/Goals: 10-14 days modI          Con't CIR- PT and OT- no therapy today due to schedule-  2.  Atrial fibrillation: Vascular study reviewed and is negative for clot.  Convert to Xarelto 10 mg daily x30 days now to minimize injections. Giver 12.5 lopressor now for afib on EKG and restart Toprol XL HS. Referred for outpatient cardiology, repeat EKG tomorrow morning. Will ask cardiology to follow this weekend.   5/27- not in Afib on exam or on EKG this AM- however had some changes on EKG- want  Cards to take a look- could be lead changes- no Chest pain or SOB- no Cardiac Sx's. Will check CBC/CMP and Mg to make sure electrolytes/infection aren't causing her to go into Afib.              -antiplatelet therapy: N/A 3. Pain: continue Lidoderm patch, Robaxin 1000 mg 4 times daily, oxycodone as needed. Discussed amitriptyline at night but she refuses given potential side effects  5/27- pt says doesn't want any opiates - con't regimen- pain 1-2/10.  4. Insomnia: Continue 5-10mg  oxycodone HS prn to help her sleep. Discussed amitriptyline but patient defers.  5. Neuropsych: This patient is capable of making decisions on  her own behalf. 6. Skin/Wound Care: Routine skin checks 7. Fluids/Electrolytes/Nutrition: Routine in and outs with follow-up chemistries 8.  T11/12 endplate fracture.  Lumbar corset as directed per Dr. Conchita Paris 9.  Left rib fractures 2 and 6.  Conservative care 10.  Urinary retention.  Discontinue [name]. 5/27- is voiding well  12.  Constipation.  MiraLAX daily, Colace 100 mg twice daily. Start magnesium gluconate 250mg  HS. Check magnesium level tomorrow morning.   5/27- mg level this AM 13. Vitamin D deficiency: Level is 17: started ergocalciferol 50,000U once per week every 7 weeks on 5/17.  14. Transaminitis: Tylenol changed to PRN, repeat CMP Monday.  15. Hypotension: abdominal binder and TEDs ordered for therapy- did not need today since AM afib meds were held.  16. Bradycardia: Toprol changed to 25mg  XL HS.   -5/22 Pulse last check 69, stable continue to follow  5/27- Pulse 62 this AM- no Afib this AM 17. Left scapular fracture with worsening pain: MRI ordered and results discussed with patient 18. Hypotension: improved, continue abdominal binder and TEDs.   -Cardiology recommending increased salt intake, continue to monitor  -discussed with cardiology weaning off lopressor given hypotension with therapy but in afib again today on EKG, repeat EKG tomorrow,  reconsulting cardiology, and restarting Toprol XL HS.  Discussed midodrine 2.5mg  prior to therapy to help with hypotension but patient defers at this time. 5/27- pt wants to reduce flecanide to 50 mg BID since was her home dose- I explained I was hesitant to do this since had HR up to 170s with therapy yesterday and 145 on EKG yesterday- will defer ot Cardiology.   19. E coli UTI: start keflex  5/27- pan sensitive so will con't Keflex for at least 5 days.    I spent a total of  39  minutes on total care today- >50% coordination of care- due to prolonged d/w pt about plan; review of EKG today/yesterday and d/w nursing.      LOS: 10 days A FACE TO FACE EVALUATION WAS PERFORMED  Terri Drake 01/14/2022, 9:29 AM

## 2022-01-14 NOTE — Progress Notes (Signed)
EKG in work list but not showing as order, checked order history and did see EKG order for the 26th. Located EKG in hard chart. Marked as unconfirmed and placed back in chart. Cardiology to see patient.

## 2022-01-15 NOTE — Progress Notes (Signed)
Physical Therapy Session Note  Patient Details  Name: Terri Drake MRN: [id number] Date of Birth: [birth year]  Today's Date: 01/15/2022 PT Individual Time: 1350-1435 PT Individual Time Calculation (min): 45 min   Short Term Goals: Week 1:  PT Short Term Goal 1 (Week 1): pt will transfer bed<>chair with LRAD and supervision PT Short Term Goal 1 - Progress (Week 1): Progressing toward goal PT Short Term Goal 2 (Week 1): pt will ambulate 54f with LRAD and min A PT Short Term Goal 2 - Progress (Week 1): Met PT Short Term Goal 3 (Week 1): pt will perform simulated car transfer with LRAD and CGA PT Short Term Goal 3 - Progress (Week 1): Met Week 2:  PT Short Term Goal 1 (Week 2): STG=LTG due to LOS  Skilled Therapeutic Interventions/Progress Updates:  Pt seated in wc.  She rated pain posterior L shoulder 3/10.  Pt verbalized her precautions and injuries.    Wc propulsion throughout unit for therapy, 150' with supervision.  Pt locked/unlocked brakes with 1 cues; total assist for managing RELR.    Therapeutic exercise performed with LLE to increase strength and muscular endurance for functional mobility: use of Kinetron from wc level, resistance 20 cm/sec plus resistance provided by PT on other foot plate, x 2 min x 2 focused on quadriceps strengthening, x 2 min x 2 focused on gluteal strengthening.  Gait training on level tile, L PFRW, x 32', x 55' with CGA.  Cues for forward gaze and breathing.  Distances limited by fatigue rather than dizziness/feeling hot due to BP dropping.  BP in sitting monitored during session: -after propelling wc with bil UEs and LLE: 103/56, HR 59 -after 2 min using kinetron: 96/65, HR 71 -after brief rest then 2 more min of Kinetron: 121/56, HR 64 -after 32' gait training: 117/55. HR 64 -after brief rest then 55' gait training: 114/55, HR 63  At end of session, pt seated in wc with RLE elevated, needs at hand.     Therapy Documentation Precautions:   Precautions Precautions: Back, Fall Precaution Comments: orthostatic hypotension 5/15 Required Braces or Orthoses: Sling, Other Brace Other Brace: LSO for OOB, sling for comfort (allowed to move LUE) Restrictions Weight Bearing Restrictions: Yes LUE Weight Bearing: Weight bearing as tolerated RLE Weight Bearing: Non weight bearing      Therapy/Group: Individual Therapy  Shantal Roan 01/15/2022, 2:47 PM

## 2022-01-15 NOTE — Progress Notes (Signed)
At discharge pt is requesting a follow up with Dr Ave Filter at [facility] for her left shoulder.

## 2022-01-15 NOTE — Progress Notes (Addendum)
PROGRESS NOTE   Subjective/Complaints:  LBM yesterday however getting on BSC righ tnow.   Didn't see Cards yesterday- no therapy yesterday- has therapy today at 1:45- is concerned will still drop /orthostatic hypotension with therapy and that her heart rate will speed up- is ~ 60s at rest, but was 140s-170s Friday.   Have paged Cards.   ROS:   Pt denies SOB, abd pain, CP, N/V/C/D, and vision changes   Objective:   No results found. Recent Labs    01/14/22 0957  WBC 6.6  HGB 13.6  HCT 38.3  PLT 419*    Recent Labs    01/14/22 0957  NA 137  K 4.3  CL 105  CO2 22  GLUCOSE 111*  BUN 14  CREATININE 0.72  CALCIUM 9.3      Intake/Output Summary (Last 24 hours) at 01/15/2022 3154 Last data filed at 01/14/2022 2300 Gross per 24 hour  Intake 653 ml  Output --  Net 653 ml        Physical Exam: Vital Signs Blood pressure 116/65, pulse 65, temperature 98 F (36.7 C), temperature source Oral, resp. rate 17, height 5\' 7"  (1.702 m), weight 88.5 kg, SpO2 97 %.     General: awake, alert, appropriate, NAD HENT: conjugate gaze; oropharynx moist CV: regular rate- no Afib- regular rhythm; rate in 60s; ; no JVD Pulmonary: CTA B/L; no W/R/R- good air movement GI: soft, NT, ND, (+)BS Psychiatric: appropriate Neurological: Ox3 Skin: splint in place to right lower extremity. Bruising of left arm. Would care dressing to left knee abraison Neuro: Alert x3 and follows commands Sit to stand MinA MSK: improved range of motion of left shoulder to 70 degrees,- Active ROM; can lift higher passively.  pain imited   Assessment/Plan: 1. Functional deficits which require 3+ hours per day of interdisciplinary therapy in a comprehensive inpatient rehab setting. Physiatrist is providing close team supervision and 24 hour management of active medical problems listed below. Physiatrist and rehab team continue to assess barriers  to discharge/monitor patient progress toward functional and medical goals  Care Tool:  Bathing    Body parts bathed by patient: Right arm, Left arm, Chest, Abdomen, Front perineal area, Right upper leg, Left upper leg, Right lower leg, Left lower leg, Face, Buttocks   Body parts bathed by helper: Buttocks     Bathing assist Assist Level: Supervision/Verbal cueing     Upper Body Dressing/Undressing Upper body dressing   What is the patient wearing?: Button up shirt    Upper body assist Assist Level: Set up assist    Lower Body Dressing/Undressing Lower body dressing      What is the patient wearing?: Underwear/pull up, Pants     Lower body assist Assist for lower body dressing: Minimal Assistance - Patient > 75%     Toileting Toileting    Toileting assist Assist for toileting: Contact Guard/Touching assist     Transfers Chair/bed transfer  Transfers assist     Chair/bed transfer assist level: Contact Guard/Touching assist     Locomotion Ambulation   Ambulation assist   Ambulation activity did not occur: Safety/medical concerns  Assist level: Contact Guard/Touching assist Assistive device:  Walker-platform Max distance: 7832ft   Walk 10 feet activity   Assist  Walk 10 feet activity did not occur: Safety/medical concerns (low BP, decreased balance, pain)  Assist level: Contact Guard/Touching assist Assistive device: Walker-rolling   Walk 50 feet activity   Assist Walk 50 feet with 2 turns activity did not occur: Safety/medical concerns (low BP, decreased balance, pain)         Walk 150 feet activity   Assist Walk 150 feet activity did not occur: Safety/medical concerns (low BP, decreased balance, pain)         Walk 10 feet on uneven surface  activity   Assist Walk 10 feet on uneven surfaces activity did not occur: Safety/medical concerns (low BP, decreased balance, pain)         Wheelchair     Assist Is the patient using a  wheelchair?: Yes Type of Wheelchair: Manual Wheelchair activity did not occur: Safety/medical concerns (low BP)  Wheelchair assist level: Independent Max wheelchair distance: 150    Wheelchair 50 feet with 2 turns activity    Assist    Wheelchair 50 feet with 2 turns activity did not occur: Safety/medical concerns (low BP)   Assist Level: Independent   Wheelchair 150 feet activity     Assist  Wheelchair 150 feet activity did not occur: Safety/medical concerns (low BP)   Assist Level: Supervision/Verbal cueing   Blood pressure 116/65, pulse 65, temperature 98 F (36.7 C), temperature source Oral, resp. rate 17, height 5\' 7"  (1.702 m), weight 88.5 kg, SpO2 97 %.  Medical Problem List and Plan: 1. Functional deficits secondary to closed right trimalleolar ankle fracture dislocation status post ORIF and closed left scapular body fracture nonoperative with conservative care.  Nonweightbearing right lower extremity x8 weeks with splint x2 weeks then convert to CAM for range of motion exercises.  Weightbearing as tolerated left upper extremity with sling for comfort.             -patient may not shower             -ELOS/Goals: 10-14 days modI          con't CIR- therapy today at 1:45- pt wants Cards to see her after that to see how responds to therapy- OK at rest- Con't PT and OT 2.  Atrial fibrillation: Vascular study reviewed and is negative for clot.  Convert to Xarelto 10 mg daily x30 days now to minimize injections. Giver 12.5 lopressor now for afib on EKG and restart Toprol XL HS. Referred for outpatient cardiology, repeat EKG tomorrow morning. Will ask cardiology to follow this weekend.   5/27- not in Afib on exam or on EKG this AM- however had some changes on EKG- want Cards to take a look- could be lead changes- no Chest pain or SOB- no Cardiac Sx's. Will check CBC/CMP and Mg to make sure electrolytes/infection aren't causing her to go into Afib.   5/28- Mg 2.2; K+ 4.3; no  changes on labs that would potentiate A fib.              -antiplatelet therapy: N/A 3. Pain: continue Lidoderm patch, Robaxin 1000 mg 4 times daily, oxycodone as needed. Discussed amitriptyline at night but she refuses given potential side effects  5/27- pt says doesn't want any opiates - con't regimen- pain 1-2/10.  4. Insomnia: Continue 5-10mg  oxycodone HS prn to help her sleep. Discussed amitriptyline but patient defers.  5. Neuropsych: This patient is capable of making decisions on  her own behalf. 6. Skin/Wound Care: Routine skin checks 7. Fluids/Electrolytes/Nutrition: Routine in and outs with follow-up chemistries 8.  T11/12 endplate fracture.  Lumbar corset as directed per Dr. Conchita Paris 9.  Left rib fractures 2 and 6.  Conservative care 10.  Urinary retention.  Discontinue [name]. 5/27- is voiding well  12.  Constipation.  MiraLAX daily, Colace 100 mg twice daily. Start magnesium gluconate 250mg  HS. Check magnesium level tomorrow morning.   5/27- mg level this AM  5/28- Mg 2.2 13. Vitamin D deficiency: Level is 17: started ergocalciferol 50,000U once per week every 7 weeks on 5/17.  14. Transaminitis: Tylenol changed to PRN, repeat CMP Monday.  15. Hypotension: abdominal binder and TEDs ordered for therapy- did not need today since AM afib meds were held.  16. Bradycardia: Toprol changed to 25mg  XL HS.   -5/22 Pulse last check 69, stable continue to follow  5/27- Pulse 62 this AM- no Afib this AM  5/27- No Afib- rate in 60s on exam 17. Left scapular fracture with worsening pain: MRI ordered and results discussed with patient 18. Hypotension: improved, continue abdominal binder and TEDs.   -Cardiology recommending increased salt intake, continue to monitor  -discussed with cardiology weaning off lopressor given hypotension with therapy but in afib again today on EKG, repeat EKG tomorrow, reconsulting cardiology, and restarting Toprol XL HS.  Discussed midodrine 2.5mg  prior to  therapy to help with hypotension but patient defers at this time. 5/27- pt wants to reduce flecanide to 50 mg BID since was her home dose- I explained I was hesitant to do this since had HR up to 170s with therapy yesterday and 145 on EKG yesterday- will defer ot Cardiology.  5/28- have paged Cards- [facility]- pending call back.   19. E coli UTI: start keflex  5/27- pan sensitive so will con't Keflex for at least 5 days.     I spent a total of 35   minutes on total care today- >50% coordination of care- due to calling Cards and speaking with pt and nurse and therapy about plan.   Addendum- spoke with Dr 6/28- decided to not make changes today except NOT to use Midodrine at this time- however if need to make any changes due to orthostatic hypotension, can dorp Toprol XL to 12.5 mg daily- which per Cards, was her home dose, actually.   LOS: 11 days A FACE TO FACE EVALUATION WAS PERFORMED  Laramie Gelles 01/15/2022, 9:37 AM

## 2022-01-16 DIAGNOSIS — S46002S Unspecified injury of muscle(s) and tendon(s) of the rotator cuff of left shoulder, sequela: Secondary | ICD-10-CM

## 2022-01-16 DIAGNOSIS — N390 Urinary tract infection, site not specified: Secondary | ICD-10-CM

## 2022-01-16 DIAGNOSIS — A499 Bacterial infection, unspecified: Secondary | ICD-10-CM

## 2022-01-16 NOTE — Patient Instructions (Signed)
1) SHOULDER: Flexion On Table   Place hands on towel placed on table, elbows straight. Lean forward with you upper body, pushing towel away from body.  __10_ reps per set, __1-2_ sets per day  2) Abduction (Passive)   With arm out to side, resting on towel placed on table with palm DOWN, keeping trunk away from table, lean to the side while pushing towel away from body.  Repeat __10__ times. Do __1-2__ sessions per day.  Copyright  V[employer]. All rights reserved.        

## 2022-01-16 NOTE — Progress Notes (Signed)
Physical Therapy Session Note  Patient Details  Name: Terri Drake MRN: [id number] Date of Birth: [birth year]  Today's Date: 01/16/2022 PT Individual Time: [phone] and [phone] PT Individual Time Calculation (min): 58 min and 40 min  Short Term Goals: Week 1:  PT Short Term Goal 1 (Week 1): pt will transfer bed<>chair with LRAD and supervision PT Short Term Goal 1 - Progress (Week 1): Progressing toward goal PT Short Term Goal 2 (Week 1): pt will ambulate 83f with LRAD and min A PT Short Term Goal 2 - Progress (Week 1): Met PT Short Term Goal 3 (Week 1): pt will perform simulated car transfer with LRAD and CGA PT Short Term Goal 3 - Progress (Week 1): Met Week 2:  PT Short Term Goal 1 (Week 2): STG=LTG due to LOS  Skilled Therapeutic Interventions/Progress Updates:   Treatment Session 1 Received pt sitting in WC, pt agreeable to PT treatment, and reported pain 3/10 in L shoulder - pt declined any pain medications. Session with emphasis on functional mobility/transfers, generalized strengthening and endurance, dynamic standing balance, gait training, an BP management. Sit<>stand with L PFRW and close supervision and donned LSO in standing with max A. Pt performed WC mobility 1074fusing BUE and LLE mod I to dayroom. Pt performed all sit<>stands with L PFRW and close supervision throughout session. Pt ambulated 4032f 1, 3f48f1, and 46ft11f with L PFRW and close supervision/CGA - pt demonstrating good adherence to RLE NWB precautions but decreased LLE foot clearance with increased fatigue. Of note, pt did not c/o any dizziness with gait, just fatigue. Pt transferred on/off mat with L PFRW and close supervision and performed the following standing exercises with emphasis on standing tolerance/BP management, LE strength, and standing balance: -R hip flexion with 1.5lb ankle weight 2x12 - BP: 97/43 -R hip abduction with 1lb ankle weight 1x12 and 1x20 - BP: 111/52 -R hip extension with 1.5lb  ankle weight 1x12  and 1x20 - BP: 103/48 Worked on standing tolerance, standing statically for 5 minutes - BP: 105/67 in standing and increased to 110/89 once seated - no c/o dizziness. Pt then ambulated 44ft 32f L PFRW and close supervision towards room. Pt performed WC mobility 50ft u67f BUE and LLE and supervision/mod I back to room. Concluded session with [facility].   Orthostatics: - L ted hose donned Sitting after propelling to dayroom: 110/47 Sitting after ambulating trial 1: 113/58 Sitting after ambulating trial 2: 114/62 Sitting after ambulating trial 3: 113/61 Sitting after ambulating trial 4: 94/50 decreasing to 89/48  Treatment Session 2 Received pt sitting in WC, pt agreeable to PT treatment, and reported pain 5/10 in L shoulder - declined any pain medication but using ice pack to affected area. Session with emphasis on functional mobility/transfers, generalized strengthening and endurance, dynamic standing balance, gait training, an BP management. Pt performed WC mobility 100ft x 51fials using BUE and LLE mod I to/from dayroom. Pt requested to work on more gait training and performed all transfers with L PFRW and close supervision throughout session. Pt ambulated 44ft x 160fft x1, 68f x1, a108f7ft x 1 wi61f PFRW and close supervision with close WC follow. Pt demonstrates improvements in LLE foot clearance, ability to land gently on LLE, and appropriate step height. Pt performed seated BUE and LLE strengthening on Nustep at workload 4 for 8 minutes with steps/minute >55 for 444 steps with emphasis on cardiovascular endurance  and LUE strength/ROM. Returned to room and concluded session with pt sitting in [facility] with all needs within reach awaiting upcoming OT session.   Orthostatics: - L ted hose donned BP after propelling to dayroom: 122/60 BP after ambulating trial 1: 139/75 BP after ambulating trial 2:136/58 BP after 8 minutes on Nustep: 110/49 BP  after ambulating trial 3: 104/51  Therapy Documentation Precautions:  Precautions Precautions: Back, Fall Precaution Comments: orthostatic hypotension 5/15 Required Braces or Orthoses: Sling, Other Brace Other Brace: LSO for OOB, sling for comfort (allowed to move LUE) Restrictions Weight Bearing Restrictions: Yes LUE Weight Bearing: Weight bearing as tolerated RLE Weight Bearing: Non weight bearing  Therapy/Group: Individual Therapy Alfonse Alpers PT, DPT  01/16/2022, 6:50 AM

## 2022-01-16 NOTE — Progress Notes (Signed)
Occupational Therapy Session Note  Patient Details  Name: Terri Drake MRN: [id number] Date of Birth: Dec 26, 1960  Today's Date: 01/16/2022 OT Individual Time: 1000-1100 OT Individual Time Calculation (min): 60 min    Short Term Goals: Week 1:  OT Short Term Goal 1 (Week 1): Patient will stand pivot transfer with platform walker to [facility] with min assist OT Short Term Goal 1 - Progress (Week 1): Met OT Short Term Goal 2 (Week 1): Patient will stand to pull up/down LB clothing with intermittent min assistance OT Short Term Goal 2 - Progress (Week 1): Met OT Short Term Goal 3 (Week 1): Patient will demonstrate 60* of shoulder flexion with LUE without report of increased pain OT Short Term Goal 3 - Progress (Week 1): Met  Skilled Therapeutic Interventions/Progress Updates:     S: Pt requesting to take a shower. MD ok'd via secure chat.   O: Shower transfer: Programmer, systems and maintaining weight bearing status. Shower bench used. UB and LB bathing: Completed at Supervision level with patient weight shifting laterally to complete all hygiene.  UB and LB dressing: Patient retrieved clothing prior to session. Shirt donned with supervision. Able to thread both legs in pant legs and underwear with supervision. Stood at sink to pull up. OT provided very minimal assist for left side of clothing.  Platform walker used when standing Functional transfer: Completed squat pivot transfer towards left side to transfer from wheelchair to mat table and back. Utilized flip back arm rest to increase safety. Table slides: flexion 5X, abduction 5X Ice pack applied to left shoulder at end of session.     A: Skilled OT session completed focusing on ADL re-training with patient requesting a shower. Patient was able to complete a safe and functional shower transfer with contact guard assist. Verbal cues and wheelchair set-up was provided by OT for safety, form, and technique. RLE was wrapped to  prevent getting wet. When completing LB dressing, patient was educated to internally rotate her right arm behind her to grab the wait of her pants as far to the left as possible to increase functional performance. Pt was able to demonstrate improvement with pulling pants over her hips on the left side. Attempted to have patient lay supine on mat table to work on LUE. Pt unable to tolerate laying completely flat. When seated, patient presents with increased tenderness in left shoulder and scapular region. Light touch was also sensitive. Shoulder and upper arm with continued swelling. Recommended increasing her use of ice daily. Table slides completed during session and added to HEP with handout provided. Verbal and visual demonstration provided with patient able to return. Pt left in room with ice pack applied to left shoulder.     P:  Encourage ice and other pain management techniques for left shoulder pain to allow for increase joint mobility.   Therapy Documentation Precautions:  Precautions Precautions: Back, Fall Precaution Comments: orthostatic hypotension 5/15 Required Braces or Orthoses: Sling, Other Brace Other Brace: LSO for OOB, sling for comfort (allowed to move LUE) Restrictions Weight Bearing Restrictions: Yes LUE Weight Bearing: Weight bearing as tolerated RLE Weight Bearing: Non weight bearing  Pain: Pain Assessment Pain Scale: 0-10 Pain Score: 4  Pain Type: Acute pain Pain Location: Shoulder Pain Orientation: Left Pain Descriptors / Indicators: Aching;Sore Pain Intervention(s): Shower;Cold applied   Therapy/Group: Individual Therapy  Ailene Ravel, OTR/L,CBIS  Supplemental OT - [facility]  01/16/2022, 12:50 PM

## 2022-01-16 NOTE — Progress Notes (Signed)
PROGRESS NOTE   Subjective/Complaints:  No issues with HR over weekend. Had questions about her cardiac meds and f/u for her left shoulder  ROS: Patient denies fever, rash, sore throat, blurred vision, dizziness, nausea, vomiting, diarrhea, cough, shortness of breath or chest pain,  headache, or mood change.    Objective:   No results found. Recent Labs    01/14/22 0957  WBC 6.6  HGB 13.6  HCT 38.3  PLT 419*    Recent Labs    01/14/22 0957  NA 137  K 4.3  CL 105  CO2 22  GLUCOSE 111*  BUN 14  CREATININE 0.72  CALCIUM 9.3      Intake/Output Summary (Last 24 hours) at 01/16/2022 1121 Last data filed at 01/15/2022 2300 Gross per 24 hour  Intake 716 ml  Output --  Net 716 ml        Physical Exam: Vital Signs Blood pressure (!) 124/56, pulse (!) 51, temperature 98 F (36.7 C), resp. rate 17, height 5\' 7"  (1.702 m), weight 88.5 kg, SpO2 98 %.     Constitutional: No distress . Vital signs reviewed. HEENT: NCAT, EOMI, oral membranes moist Neck: supple Cardiovascular: RRR without murmur. No JVD    Respiratory/Chest: CTA Bilaterally without wheezes or rales. Normal effort    GI/Abdomen: BS +, non-tender, non-distended Ext: no clubbing, cyanosis, or edema Psych: pleasant and cooperative  Skin: splint in place to right lower extremity. Bruising of left arm. Would care dressing to left knee abraison Neuro: Alert x3 and follows commands Sit to stand MinA MSK: improved range of motion of left shoulder to 70 degrees,- Active ROM; can lift higher passively--she tolerates pain with PROM   Assessment/Plan: 1. Functional deficits which require 3+ hours per day of interdisciplinary therapy in a comprehensive inpatient rehab setting. Physiatrist is providing close team supervision and 24 hour management of active medical problems listed below. Physiatrist and rehab team continue to assess barriers to  discharge/monitor patient progress toward functional and medical goals  Care Tool:  Bathing    Body parts bathed by patient: Right arm, Left arm, Chest, Abdomen, Front perineal area, Right upper leg, Left upper leg, Right lower leg, Left lower leg, Face, Buttocks   Body parts bathed by helper: Buttocks     Bathing assist Assist Level: Supervision/Verbal cueing     Upper Body Dressing/Undressing Upper body dressing   What is the patient wearing?: Button up shirt    Upper body assist Assist Level: Set up assist    Lower Body Dressing/Undressing Lower body dressing      What is the patient wearing?: Underwear/pull up, Pants     Lower body assist Assist for lower body dressing: Minimal Assistance - Patient > 75%     Toileting Toileting    Toileting assist Assist for toileting: Contact Guard/Touching assist     Transfers Chair/bed transfer  Transfers assist     Chair/bed transfer assist level: Supervision/Verbal cueing     Locomotion Ambulation   Ambulation assist   Ambulation activity did not occur: Safety/medical concerns  Assist level: Supervision/Verbal cueing Assistive device: Walker-platform Max distance: 4047ft   Walk 10 feet activity   Assist  Walk 10 feet activity did not occur: Safety/medical concerns (low BP, decreased balance, pain)  Assist level: Supervision/Verbal cueing Assistive device: Walker-platform   Walk 50 feet activity   Assist Walk 50 feet with 2 turns activity did not occur: Safety/medical concerns (low BP, decreased balance, pain)  Assist level: Contact Guard/Touching assist Assistive device: Walker-rolling    Walk 150 feet activity   Assist Walk 150 feet activity did not occur: Safety/medical concerns (low BP, decreased balance, pain)         Walk 10 feet on uneven surface  activity   Assist Walk 10 feet on uneven surfaces activity did not occur: Safety/medical concerns (low BP, decreased balance, pain)          Wheelchair     Assist Is the patient using a wheelchair?: Yes Type of Wheelchair: Manual Wheelchair activity did not occur: Safety/medical concerns (low BP)  Wheelchair assist level: Supervision/Verbal cueing Max wheelchair distance: 150    Wheelchair 50 feet with 2 turns activity    Assist    Wheelchair 50 feet with 2 turns activity did not occur: Safety/medical concerns (low BP)   Assist Level: Supervision/Verbal cueing   Wheelchair 150 feet activity     Assist  Wheelchair 150 feet activity did not occur: Safety/medical concerns (low BP)   Assist Level: Supervision/Verbal cueing   Blood pressure (!) 124/56, pulse (!) 51, temperature 98 F (36.7 C), resp. rate 17, height 5\' 7"  (1.702 m), weight 88.5 kg, SpO2 98 %.  Medical Problem List and Plan: 1. Functional deficits secondary to closed right trimalleolar ankle fracture dislocation status post ORIF and closed left scapular body fracture nonoperative with conservative care.  Nonweightbearing right lower extremity x8 weeks with splint x2 weeks then convert to CAM for range of motion exercises.  Weightbearing as tolerated left upper extremity with sling for comfort.             -patient may not shower             -ELOS/Goals: 5/31, modI             5/29 -pt would like follow up for her left shoulder after discharge. She mentioned being familiar with Dr. 6/29 with [facility]. I also mentioned that she could follow up in our office. I'll defer that decision to Dr. Ave Filter 2.  Atrial fibrillation: Vascular study reviewed and is negative for clot.  Convert to Xarelto 10 mg daily x30 days now to minimize injections. Giver 12.5 lopressor now for afib on EKG and restart Toprol XL HS. Referred for outpatient cardiology, repeat EKG tomorrow morning. Will ask cardiology to follow this weekend.   5/27- not in Afib on exam or on EKG this AM- however had some changes on EKG- want Cards to take a look- could be lead  changes- no Chest pain or SOB- no Cardiac Sx's. Will check CBC/CMP and Mg to make sure electrolytes/infection aren't causing her to go into Afib.   5/28- Mg 2.2; K+ 4.3; no changes on labs that would potentiate A fib.   5/29 no issues since metoprolol was resumed. She would like to speak with cardiology about flecainide before she leaves on 5/31             -antiplatelet therapy: N/A 3. Pain: continue Lidoderm patch, Robaxin 1000 mg 4 times daily, oxycodone as needed. Discussed amitriptyline at night but she refuses given potential side effects  5/27- pt says doesn't want any opiates - con't regimen- pain 1-2/10.  4. Insomnia: Continue 5-10mg  oxycodone HS prn to help her sleep. Discussed amitriptyline but patient defers.  5. Neuropsych: This patient is capable of making decisions on her own behalf. 6. Skin/Wound Care: Routine skin checks 7. Fluids/Electrolytes/Nutrition: Routine in and outs with follow-up chemistries 8.  T11/12 endplate fracture.  Lumbar corset as directed per Dr. Conchita Paris 9.  Left rib fractures 2 and 6.  Conservative care 10.  Urinary retention.  Discontinue [name]. 5/27- is voiding well  12.  Constipation.  MiraLAX daily, Colace 100 mg twice daily. Start magnesium gluconate 250mg  HS. Check magnesium level tomorrow morning.   5/27- mg level this AM  5/28- Mg 2.2 13. Vitamin D deficiency: Level is 17: started ergocalciferol 50,000U once per week every 7 weeks on 5/17.  14. Transaminitis: Tylenol changed to PRN, repeat CMP Monday.  15. Hypotension: abdominal binder and TEDs ordered for therapy- did not need today since AM afib meds were held.  16. Bradycardia: Toprol changed to 25mg  XL HS.   -5/22 Pulse last check 69, stable continue to follow  5/29 pulse is in 50's and 60's.  17. Left scapular fracture with worsening pain: MRI ordered and results discussed with patient 18. Hypotension: improved, continue abdominal binder and TEDs.   -Cardiology recommending increased  salt intake, continue to monitor  -discussed with cardiology weaning off lopressor given hypotension with therapy but in afib again today on EKG, repeat EKG tomorrow, reconsulting cardiology, and restarting Toprol XL HS.  Discussed midodrine 2.5mg  prior to therapy to help with hypotension but patient defers at this time.  5/29 pt stable today. Can reach out to cards tomorrow as there is not an acute issue today. If hypotension is still an issue, can drop metoprolol to 12.5mg  daily per cards recommendation 19. E coli UTI: start keflex  5/27- pan sensitive so will con't Keflex for at least 5 days.         LOS: 12 days A FACE TO FACE EVALUATION WAS PERFORMED  6/29 01/16/2022, 11:21 AM

## 2022-01-16 NOTE — Progress Notes (Signed)
Occupational Therapy Session Note  Patient Details  Name: Terri Drake MRN: [id number] Date of Birth: 1960-11-13  Today's Date: 01/16/2022 OT Individual Time: 1345-1435 OT Individual Time Calculation (min): 50 min    Short Term Goals: Week 2:  OT Short Term Goal 1 (Week 2): STG = LTG 2/2 ELOS  Skilled Therapeutic Interventions/Progress Updates:    Pt received in wc ready for therapy. Reviewed her goals as she will be getting ready to go home on Wednesday.  She stated her husband just retired and does much of the cooking, so she does not plan on doing any cooking until she is WB on her R foot.  The meal prep goal is discontinued.  Pt self propelled in wc to ADL kitchen to practice with "home" items she has that she can use for her L shoulder exercises. Shoulder AROM with L hand holding onto end of swiffer sweeper to do circles and forward back ROM.  Graded the activity more difficult and placed end of sweeper on chair to increase sh ROM. Counter top slides with arm resting on kitchen counter for sh abduction, ext rotation and flexion. Pt unable to bend at waist for back precautions which limits her ability to get full sh ROM. Moved to corner wall and pt worked on wall slides which enabled her to get more ROM.    Discussed how folding towels and drying dishes would be light exercises for her L arm.  Pt self propelled back to her room. Pt requested ice pack for L shoulder.  Pt in room with all needs met.   Therapy Documentation Precautions:  Precautions Precautions: Back, Fall Precaution Comments: orthostatic hypotension 5/15 Required Braces or Orthoses: Sling, Other Brace Other Brace: LSO for OOB, sling for comfort (allowed to move LUE) Restrictions Weight Bearing Restrictions: Yes LUE Weight Bearing: Weight bearing as tolerated RLE Weight Bearing: Non weight bearing    Vital Signs: Therapy Vitals Temp: 98 F (36.7 C) Pulse Rate: (!) 51 Resp: 17 BP: (!) 124/56 Patient  Position (if appropriate): Lying Oxygen Therapy SpO2: 98 % O2 Device: Room Air Pain:  C/o pain in end ranges of L shoulder with exercises, pt modified with only using ROM she could tolerated ADL: ADL Eating: Independent Where Assessed-Eating: Chair Grooming: Minimal assistance Where Assessed-Grooming: Sitting at sink Upper Body Bathing: Minimal assistance Where Assessed-Upper Body Bathing: Sitting at sink Lower Body Bathing: Moderate assistance Where Assessed-Lower Body Bathing: Sitting at sink Upper Body Dressing: Minimal assistance Where Assessed-Upper Body Dressing: Chair Lower Body Dressing: Moderate assistance Where Assessed-Lower Body Dressing: Chair Toileting: Minimal assistance Where Assessed-Toileting: Bedside Commode Toilet Transfer: Moderate assistance (monitor BP - orthostatic) Toilet Transfer Method: Stand pivot Toilet Transfer Equipment: Bedside commode (PFRW) Tub/Shower Transfer: Other (comment), Not assessed (Unable to shower per MD)   Therapy/Group: Individual Therapy  [place] 01/16/2022, 8:32 AM

## 2022-01-17 ENCOUNTER — Telehealth: Payer: Self-pay

## 2022-01-17 MED ORDER — RIVAROXABAN 10 MG PO TABS: ORAL_TABLET | ORAL | 0 refills | Status: DC

## 2022-01-17 MED ORDER — ZINC SULFATE 220 (50 ZN) MG PO CAPS: 220.0000 mg | ORAL_CAPSULE | Freq: Every day | ORAL | 0 refills | Status: DC

## 2022-01-17 MED ORDER — ASCORBIC ACID 500 MG PO TABS: 500.0000 mg | ORAL_TABLET | Freq: Every day | ORAL | 0 refills | Status: DC

## 2022-01-17 MED ORDER — ACETAMINOPHEN 325 MG PO TABS: 325.0000 mg | ORAL_TABLET | ORAL | Status: DC | PRN

## 2022-01-17 MED ORDER — LIDOCAINE 5 % EX PTCH: 1.0000 | MEDICATED_PATCH | CUTANEOUS | 0 refills | Status: DC

## 2022-01-17 MED ORDER — VITAMIN D (ERGOCALCIFEROL) 1.25 MG (50000 UNIT) PO CAPS: 50000.0000 [IU] | ORAL_CAPSULE | ORAL | 0 refills | Status: DC

## 2022-01-17 MED ORDER — MAGNESIUM GLUCONATE 500 MG PO TABS: 250.0000 mg | ORAL_TABLET | Freq: Every day | ORAL | 0 refills | Status: AC

## 2022-01-17 MED ORDER — OXYCODONE HCL 5 MG PO TABS
5.0000 mg | ORAL_TABLET | ORAL | 0 refills | Status: DC | PRN
Start: 2022-01-17 — End: 2022-02-03

## 2022-01-17 MED ORDER — METHOCARBAMOL 1000 MG PO TABS: 1000.0000 mg | ORAL_TABLET | Freq: Four times a day (QID) | ORAL | 0 refills | Status: DC | PRN

## 2022-01-17 MED ORDER — METOPROLOL SUCCINATE ER 25 MG PO TB24: 25.0000 mg | ORAL_TABLET | Freq: Every day | ORAL | 0 refills | Status: DC

## 2022-01-17 MED ORDER — FLECAINIDE ACETATE 100 MG PO TABS
100.0000 mg | ORAL_TABLET | Freq: Two times a day (BID) | ORAL | 0 refills | Status: DC
Start: 2022-01-17 — End: 2022-02-03

## 2022-01-17 NOTE — Progress Notes (Signed)
Inpatient Rehabilitation Discharge Medication Review by a Pharmacist  A complete drug regimen review was completed for this patient to identify any potential clinically significant medication issues.  High Risk Drug Classes Is patient taking? Indication by Medication  Antipsychotic No   Anticoagulant Yes Xarelto for VTE ppx  Antibiotic No   Opioid Yes Oxycodone for pain  Antiplatelet No   Hypoglycemics/insulin No   Vasoactive Medication Yes Flecainide for a arrhythmia, metoprolol for BP  Chemotherapy No   Other Yes Lidocaine patch for pain Xalatan for glaucoma Robaxin for muscle spasms     Type of Medication Issue Identified Description of Issue Recommendation(s)  Drug Interaction(s) (clinically significant)     Duplicate Therapy     Allergy     No Medication Administration End Date     Incorrect Dose     Additional Drug Therapy Needed     Significant med changes from prior encounter (inform family/care partners about these prior to discharge).    Other       Clinically significant medication issues were identified that warrant physician communication and completion of prescribed/recommended actions by midnight of the next day:  No   Pharmacist comments: none  Time spent performing this drug regimen review (minutes):  20 minutes   Elwin Sleight 01/17/2022 9:46 AM

## 2022-01-17 NOTE — Progress Notes (Signed)
PROGRESS NOTE   Subjective/Complaints: No new complaints this morning HR stable over the weekend, she asks about decreasing Flecanide and discussed cardiology's recommendations  ROS: Patient denies fever, rash, sore throat, blurred vision, dizziness, nausea, vomiting, diarrhea, cough, shortness of breath or chest pain,  headache, or mood change.    Objective:   No results found. No results for input(s): WBC, HGB, HCT, PLT in the last 72 hours.   No results for input(s): NA, K, CL, CO2, GLUCOSE, BUN, CREATININE, CALCIUM in the last 72 hours.     Intake/Output Summary (Last 24 hours) at 01/17/2022 1030 Last data filed at 01/17/2022 0739 Gross per 24 hour  Intake 720 ml  Output --  Net 720 ml        Physical Exam: Vital Signs Blood pressure (!) 108/46, pulse (!) 53, temperature 97.9 F (36.6 C), resp. rate 16, height 5\' 7"  (1.702 m), weight 88.5 kg, SpO2 95 %.     Constitutional: No distress . Vital signs reviewed. HEENT: NCAT, EOMI, oral membranes moist Neck: supple Cardiovascular: Bradycardia Respiratory/Chest: CTA Bilaterally without wheezes or rales. Normal effort    GI/Abdomen: BS +, non-tender, non-distended Ext: no clubbing, cyanosis, or edema Psych: pleasant and cooperative  Skin: splint in place to right lower extremity. Bruising of left arm. Would care dressing to left knee abraison Neuro: Alert x3 and follows commands Sit to stand MinA MSK: improved range of motion of left shoulder to 70 degrees,- Active ROM; can lift higher passively--she tolerates pain with PROM   Assessment/Plan: 1. Functional deficits which require 3+ hours per day of interdisciplinary therapy in a comprehensive inpatient rehab setting. Physiatrist is providing close team supervision and 24 hour management of active medical problems listed below. Physiatrist and rehab team continue to assess barriers to discharge/monitor patient  progress toward functional and medical goals  Care Tool:  Bathing    Body parts bathed by patient: Right arm, Left arm, Chest, Abdomen, Front perineal area, Right upper leg, Left upper leg, Left lower leg, Face, Buttocks   Body parts bathed by helper: Buttocks Body parts n/a: Right lower leg (cast)   Bathing assist Assist Level: Independent with assistive device     Upper Body Dressing/Undressing Upper body dressing   What is the patient wearing?: Button up shirt    Upper body assist Assist Level: Independent with assistive device    Lower Body Dressing/Undressing Lower body dressing      What is the patient wearing?: Underwear/pull up, Pants     Lower body assist Assist for lower body dressing: Independent with assitive device Assistive Device Comment: reacher   Toileting Toileting    Toileting assist Assist for toileting: Independent with assistive device     Transfers Chair/bed transfer  Transfers assist     Chair/bed transfer assist level: Independent with assistive device Chair/bed transfer assistive device:   Ambulation assist   Ambulation activity did not occur: Safety/medical concerns  Assist level: Supervision/Verbal cueing Assistive device: Walker-platform Max distance: 48ft   Walk 10 feet activity   Assist  Walk 10 feet activity did not occur: Safety/medical concerns (low BP, decreased balance, pain)  Assist level:  Supervision/Verbal cueing Assistive device: Walker-platform   Walk 50 feet activity   Assist Walk 50 feet with 2 turns activity did not occur: Safety/medical concerns (low BP, decreased balance, pain)  Assist level: Supervision/Verbal cueing Assistive device: Walker-platform    Walk 150 feet activity   Assist Walk 150 feet activity did not occur: Safety/medical concerns (fatigue, weakness, BP)         Walk 10 feet on uneven surface  activity   Assist Walk 10 feet on uneven  surfaces activity did not occur: Safety/medical concerns (fatigue, weakness, BP)         Wheelchair     Assist Is the patient using a wheelchair?: Yes Type of Wheelchair: Manual Wheelchair activity did not occur: Safety/medical concerns (low BP)  Wheelchair assist level: Independent Max wheelchair distance: >148ft    Wheelchair 50 feet with 2 turns activity    Assist    Wheelchair 50 feet with 2 turns activity did not occur: Safety/medical concerns (low BP)   Assist Level: Independent   Wheelchair 150 feet activity     Assist  Wheelchair 150 feet activity did not occur: Safety/medical concerns (low BP)   Assist Level: Independent   Blood pressure (!) 108/46, pulse (!) 53, temperature 97.9 F (36.6 C), resp. rate 16, height 5\' 7"  (1.702 m), weight 88.5 kg, SpO2 95 %.  Medical Problem List and Plan: 1. Functional deficits secondary to closed right trimalleolar ankle fracture dislocation status post ORIF and closed left scapular body fracture nonoperative with conservative care.  Nonweightbearing right lower extremity x8 weeks with splint x2 weeks then convert to CAM for range of motion exercises.  Weightbearing as tolerated left upper extremity with sling for comfort.             -patient may not shower             -ELOS/Goals: 5/31, modI            f/u with me next available.  2.  Atrial fibrillation: Vascular study reviewed and is negative for clot.  Convert to Xarelto 10 mg daily x30 days now to minimize injections. Giver 12.5 lopressor now for afib on EKG and restart Toprol XL HS. Referred for outpatient cardiology, repeat EKG tomorrow morning. Will ask cardiology to follow this weekend.   5/27- not in Afib on exam or on EKG this AM- however had some changes on EKG- want Cards to take a look- could be lead changes- no Chest pain or SOB- no Cardiac Sx's. Will check CBC/CMP and Mg to make sure electrolytes/infection aren't causing her to go into Afib.   5/28- Mg  2.2; K+ 4.3; no changes on labs that would potentiate A fib.   5/29 no issues since metoprolol was resumed. She would like to speak with cardiology about flecainide before she leaves on 5/31             -antiplatelet therapy: N/A 3. Pain: continue Lidoderm patch, Robaxin 1000 mg 4 times daily, oxycodone as needed. Discussed amitriptyline at night but she refuses given potential side effects  5/27- pt says doesn't want any opiates - con't regimen- pain 1-2/10.  4. Insomnia: Continue 5-10mg  oxycodone HS prn to help her sleep. Discussed amitriptyline but patient defers.  5. Neuropsych: This patient is capable of making decisions on her own behalf. 6. Skin/Wound Care: Routine skin checks 7. Fluids/Electrolytes/Nutrition: Routine in and outs with follow-up chemistries 8.  T11/12 endplate fracture.  Lumbar corset as directed per Dr. 12-03-1997 9.  Left rib fractures 2 and 6.  Conservative care 10.  Urinary retention.  Discontinue [name]. 5/27- is voiding well  12.  Constipation.  MiraLAX daily, Colace 100 mg twice daily. Start magnesium gluconate 250mg  HS. Check magnesium level tomorrow morning.   5/27- mg level this AM  5/28- Mg 2.2 13. Vitamin D deficiency: Level is 17: started ergocalciferol 50,000U once per week every 7 weeks on 5/17.  14. Transaminitis: Tylenol changed to PRN, repeat CMP Monday.  15. Hypotension: abdominal binder and TEDs ordered for therapy- did not need today since AM afib meds were held.  16. Bradycardia: Toprol changed to 25mg  XL HS, continue this dose for now. Discussed outpatient follow-up with cardiology.  17. Left scapular fracture with worsening pain: MRI ordered and results discussed with patient. Discussed expected healing time frame.  18. Hypotension: improved, continue abdominal binder and TEDs.   -Cardiology recommending increased salt intake, continue to monitor  -discussed with cardiology weaning off lopressor given hypotension with therapy but in afib again  today on EKG, repeat EKG tomorrow, reconsulting cardiology, and restarting Toprol XL HS.  Discussed midodrine 2.5mg  prior to therapy to help with hypotension but patient defers at this time. Improved, continue metoprolol 19. E coli UTI: start keflex  5/27- pan sensitive so will con't Keflex for at least 5 days.         LOS: 13 days A FACE TO FACE EVALUATION WAS PERFORMED  Alizaya Oshea P Johanthan Kneeland 01/17/2022, 10:30 AM

## 2022-01-17 NOTE — Progress Notes (Signed)
Inpatient Rehabilitation Care Coordinator Discharge Note   Patient Details  Name: ALLYSEN LAZO MRN: [id number] Date of Birth: Jun 16, 1961   Discharge location: Home  Length of Stay: 14 Days  Discharge activity level: Supervision  Home/community participation: Spouse  Patient response PV:VZSMOL Literacy - How often do you need to have someone help you when you read instructions, pamphlets, or other written material from your doctor or pharmacy?: Never  Patient response MB:EMLJQG Isolation - How often do you feel lonely or isolated from those around you?: Never  Services provided included: SW, Pharmacy, TR, CM, RN, SLP, PT, OT, RD, MD  Financial Services:  Financial Services Utilized: Other (Comment) (Worker's Comp)    Choices offered to/list presented to:    Follow-up services arranged:  Outpatient    Outpatient Servicies: Once WB restrictions are changed      Patient response to transportation need: Is the patient able to respond to transportation needs?: Yes In the past 12 months, has lack of transportation kept you from medical appointments or from getting medications?: No In the past 12 months, has lack of transportation kept you from meetings, work, or from getting things needed for daily living?: No    Comments (or additional information):  Patient/Family verbalized understanding of follow-up arrangements:  Yes  Individual responsible for coordination of the follow-up plan: patient or spouse  Confirmed correct DME delivered: Andria Rhein 01/17/2022    Andria Rhein

## 2022-01-17 NOTE — Progress Notes (Signed)
,   Occupational Therapy Session Note  Patient Details  Name: Terri Drake MRN: [id number] Date of Birth: 1961-08-01  Today's Date: 01/17/2022 OT Individual Time: 1005-1100 & [id number] OT Individual Time Calculation (min): 55 min & 56 min   Short Term Goals: Week 2:  OT Short Term Goal 1 (Week 2): STG = LTG 2/2 ELOS  Skilled Therapeutic Interventions/Progress Updates:  Session 1 Skilled OT intervention completed with focus on d/c planning, AROM/strengthening of LUE to prep for functional abilities in non-dominant hand. Pt received seated in w/c, initial 7/10 pain in LUE, pre-medicated, provided rest breaks and ice for pain management. Self-propelled in w/c with BUE and LLE <> gym with mod I. Seated at table, pt prepped for exercises with table slides for horizontal adduction/abduction and shoulder flexion. Issued pt more AROM exercises for HEP including shoulder external rotation, elbow extension/flexion for nose taps, wrist flex/ext and composite digit flex/ext all x10 on LUE. With .25 pound wrist weight, pt completed 2x10 shoulder flexion <90 degrees, then chest presses on table with 2, 7 lb weights on hurdle for 2x10 with full ROM. With 1.5 lb wrist weight, pt able to complete 2x10 shoulder flexion below 90 degrees, and hand to head 2x10. Education provided about functional ways to promote proximal strengthening and AROM at home such as wiping tables, walls etc. Attempted to complete manual massage to scapular region however pt reporting too much paint therefore discontinued. Pt slightly upset about decreased ROM this session, however education provided about mobility/WB effects from stand hopping with RW during transfers and overexerting herself can impact shoulder abilities and emotionally supported pt to encourage her on good/bad days even at home. Pt was left seated in w/c, with ice pack applied to L scapula and bicep regions for pain reduction, with chair alarm on and all needs in reach at  end of session.  Session 2 Skilled OT intervention completed with focus on functional endurance during shower, transfers and AA/ROM. Pt received seated in w/c, requesting to shower. Completed sit > stand and stand pivot using L PFRW with mod I to [facility] over toilet. Pericare with mod I. Doffed LB clothing for time with total A, then short few hop transfer with mod I to TTB. BP prior to shower, seated: 117/53. Applied waterproof cover to cast on RLE, then left TEDS donned on LLE for BP management. Pt doffed LSO and shirt with mod I. All bathing completed seated with mod I. After shower, seated: 117/59. Stand pivot with L PFRW with supervision to w/c then dressed/groomed with mod I including TEDS and LSO at the sit > stand level. Self-propelled in w/c <> gym with mod I, then worked on AA/ROM and proximal strengthening of LUE with the following activities: using PVC pipe for shoulder flexion, abduction and external rotation, chest presses all 2x10 each. Encouraged pt to chest press to punch thrown ball away from her with pt agreeable to try and completing x2 however increased pain therefore discontinued. Pt was left seated in w/c with chair alarm on and all needs in reach at end of session.    Therapy Documentation Precautions:  Precautions Precautions: Back, Fall Precaution Comments: monitor BP Required Braces or Orthoses: Sling, Other Brace Other Brace: LSO for OOB, sling for comfort (allowed to move LUE) Restrictions Weight Bearing Restrictions: Yes LUE Weight Bearing: Weight bearing as tolerated RLE Weight Bearing: Non weight bearing    Therapy/Group: Individual Therapy  Blase Mess, MS, OTR/L  01/17/2022, 7:38 AM

## 2022-01-17 NOTE — Telephone Encounter (Signed)
Lidocaine Patch 5% sent to plan on 01/17/2022

## 2022-01-17 NOTE — Progress Notes (Incomplete)
Slept good. Complained this am about RUQ & LUQ "fullness".  (+) BS x 4 quads. Abd soft. LBM 05/31. Also complained of left flank discomfort, she's concerned it's related to her rib fractures. SBA to [facility] with platform walker. (+) CMS to right foot. HRR.

## 2022-01-17 NOTE — Progress Notes (Signed)
Occupational Therapy Discharge Summary  Patient Details  Name: Terri Drake MRN: [id number] Date of Birth: 11-Oct-1960   Patient has met 11 of 11 long term goals due to improved activity tolerance, improved balance, functional use of  LEFT upper extremity, and improved coordination.  Patient to discharge at overall Modified Independent level. Patient's care partner is independent to provide the necessary physical assistance at discharge and has been present for multiple sessions to complete family education regarding techniques to maximize pt's safety at home and how to supervise with higher level transfers such as in pt's shower set up at home or with sink level hair washing when BP levels are too low via shower tray.  Reasons goals not met: N/A  Recommendation:  Patient will benefit from ongoing skilled OT services in home health setting initially to continue to advance functional skills in the area of BADL, iADL, and Reduce care partner burden then OPOT for inferior scapular fracture ortho/strengthening.  Equipment: 3 in 1 BSC  Reasons for discharge: treatment goals met  Patient/family agrees with progress made and goals achieved: Yes  OT Discharge Precautions/Restrictions  Precautions Precautions: Back;Fall Precaution Comments: monitor BP Required Braces or Orthoses: Sling;Other Brace Other Brace: LSO for OOB, sling for comfort (allowed to move LUE) Restrictions Weight Bearing Restrictions: Yes LUE Weight Bearing: Weight bearing as tolerated RLE Weight Bearing: Non weight bearing ADL ADL Eating: Independent Where Assessed-Eating: Chair Grooming: Modified independent Where Assessed-Grooming: Sitting at sink Upper Body Bathing: Modified independent Where Assessed-Upper Body Bathing: Shower Lower Body Bathing: Modified independent Where Assessed-Lower Body Bathing: Shower Upper Body Dressing: Independent Where Assessed-Upper Body Dressing: Wheelchair Lower Body  Dressing: Modified independent Where Assessed-Lower Body Dressing: Standing at sink, Sitting at sink, Wheelchair Toileting: Modified independent Where Assessed-Toileting: Bedside Commode Toilet Transfer: Modified independent Toilet Transfer Method: Arts development officer: Radiographer, therapeutic: Not assessed Tub/Shower Transfer Method: Unable to assess Social research officer, government: Modified independent Social research officer, government Method: Education officer, environmental: Radio broadcast assistant, Grab bars Vision Baseline Vision/History: 1 Wears glasses Patient Visual Report: No change from baseline Vision Assessment?: No apparent visual deficits Perception  Perception: Within Functional Limits Praxis Praxis: Intact Cognition Cognition Overall Cognitive Status: Within Functional Limits for tasks assessed Arousal/Alertness: Awake/alert Orientation Level: Person;Place;Situation Person: Oriented Place: Oriented Situation: Oriented Memory: Appears intact Awareness: Appears intact Problem Solving: Appears intact Reasoning: Appears intact Organizing: Appears intact Decision Making: Appears intact Initiating: Appears intact Self Monitoring: Appears intact Safety/Judgment: Appears intact Brief Interview for Mental Status (BIMS) Repetition of Three Words (First Attempt): 3 Temporal Orientation: Year: Correct Temporal Orientation: Month: Accurate within 5 days Temporal Orientation: Day: Correct Recall: "Sock": Yes, no cue required Recall: "Blue": Yes, no cue required Recall: "Bed": Yes, no cue required BIMS Summary Score: 15 Sensation Sensation Light Touch: Appears Intact Hot/Cold: Appears Intact Proprioception: Appears Intact Stereognosis: Not tested Additional Comments: reports improved sensation in ulnar side digits on LUE Coordination Gross Motor Movements are Fluid and Coordinated: No Fine Motor Movements are Fluid and Coordinated: No Coordination  and Movement Description: altered balance strategies due to RLE NWB precautions Finger Nose Finger Test: WFL on RUE, slower on LUE but ROM WFL Heel Shin Test: [facility] bilaterally Motor  Motor Motor: Within Functional Limits Motor - Skilled Clinical Observations: altered balance strategies due to RLE NWB precautions Mobility  Bed Mobility Bed Mobility: Rolling Right;Rolling Left;Sit to Supine;Supine to Sit Rolling Right: Independent with assistive device Rolling Left: Independent with assistive device Supine to Sit: Independent  with assistive device Sit to Supine: Independent with assistive device Transfers Sit to Stand: Independent with assistive device Stand to Sit: Independent with assistive device  Trunk/Postural Assessment  Cervical Assessment Cervical Assessment: Within Functional Limits Thoracic Assessment Thoracic Assessment: Exceptions to [facility] (decreased ROM/rigid) Lumbar Assessment Lumbar Assessment: Exceptions to [facility] (limited by LSO) Postural Control Postural Control: Within Functional Limits  Balance Balance Balance Assessed: Yes Static Sitting Balance Static Sitting - Balance Support: Feet supported;Bilateral upper extremity supported Static Sitting - Level of Assistance: 7: Independent Dynamic Sitting Balance Dynamic Sitting - Balance Support: Feet supported;No upper extremity supported Dynamic Sitting - Level of Assistance: 6: Modified independent (Device/Increase time) Static Standing Balance Static Standing - Balance Support: Bilateral upper extremity supported (L PFRW) Static Standing - Level of Assistance: 6: Modified independent (Device/Increase time) Dynamic Standing Balance Dynamic Standing - Balance Support: Bilateral upper extremity supported (L PFRW) Dynamic Standing - Level of Assistance: 6: Modified independent (Device/Increase time) Dynamic Standing - Comments: with transfers Extremity/Trunk Assessment RUE Assessment RUE Assessment: Within Functional  Limits LUE Assessment LUE Assessment: Exceptions to [facility] Passive Range of Motion (PROM) Comments: Limited by pain, only able to achieve same degrees as AROM Active Range of Motion (AROM) Comments: Able to achieve about 120 degrees shoulder flexion, 75 degrees shoulder abduction, full AROM elbow flexion General Strength Comments: Unable to assess 2/2 pain, however grip strength about 4-/5   Francee Setzer E Gaelle Adriance 01/17/2022, 7:41 AM

## 2022-01-17 NOTE — Progress Notes (Signed)
Physical Therapy Discharge Summary  Patient Details  Name: Terri Drake MRN: [id number] Date of Birth: 1960-11-13  Today's Date: 01/17/2022 PT Individual Time: 0800-0856 PT Individual Time Calculation (min): 56 min   Patient has met 9 of 9 long term goals due to improved activity tolerance, improved balance, improved postural control, increased strength, increased range of motion, decreased pain, ability to compensate for deficits, functional use of  left upper extremity, and improved coordination.  Patient to discharge at a wheelchair level [place]. Patient's care partner is independent to provide the necessary physical assistance at discharge. Pt's husband, Octavia Bruckner, attended multiple family education training sessions and has verbalized and demonstrated confidence with all tasks to ensure safe discharge home.   All goals met   Recommendation:  Patient will benefit from ongoing skilled PT services in home health setting initially, transitioning to OPPT once WB restrictions have been lifted to continue to advance safe functional mobility, address ongoing impairments in transfers, generalized strengthening and endurance, dynamic standing balance/coordination, gait training, and to minimize fall risk.  Equipment: L PFRW, 18x18 manual WC with elevating legrests  Reasons for discharge: treatment goals met  Patient/family agrees with progress made and goals achieved: Yes  Today's Interventions: Received pt sitting in WC, pt agreeable to PT treatment, and reported pain 3-4/10 in L shoulder - pt continues to declined pain medication. Session with emphasis on discharge planning, functional mobility/transfers, generalized strengthening and endurance, dynamic standing balance/coordination, and gait training. Sit<>stand with LPFRW and mod I and donned LSO standing with max A. Pt performed WC mobility 135f x 2 trials using BUE and LLE mod I to/from dayroom. Pt performed all  transfers with L PFRW and mod I throughout session with good adherence to RLE NWB precautions. Pt ambulated 571fx 1 and 6046f 1 with L PFRW and close supervision with total A for WC follow demonstrating good adherence to RLE NWB precautions. Worked on blocked practice squat<>pivot transfers x 4 with close supervision overall - min cues for transfer set up and reminder to remove armrest. Pt then performed WC mobility >150f104fing BUE and LLE mod I weaving in/out of cones with emphasis on navigating tight spaces/sharp turns at home. Pt then worked on statGafferng LUE with supervision for balance for 5 minutes - pt demonstrating excellent adherence to RLE. Transitioned to blocked practice sit<>stands on Airex 2x10 reps with supervision with emphasis on glute/quad strength and balance. Concluded session with pt sitting in WC [facility] all needs within reach and MD present at bedside. Provided pt with ice pack for L shoulder.   Orthostatics: - L ted hose donned Sitting after propelling to dayroom: 107/78 Sitting after ambulation trial 1: 119/58 Sitting after ambulating trial 2: 127/51 Sitting after standing for 5 minutes: 116/61 Sitting after sit<>stands trial 1: 118/64 Sitting after sit<>stands trial 2: 129/70  PT Discharge Precautions/Restrictions Precautions Precautions: Back;Fall Precaution Comments: monitor BP Required Braces or Orthoses: Sling;Other Brace Other Brace: LSO for OOB, sling for comfort (allowed to move LUE) Restrictions Weight Bearing Restrictions: Yes LUE Weight Bearing: Weight bearing as tolerated RLE Weight Bearing: Non weight bearing Pain Interference Pain Interference Pain Effect on Sleep: 3. Frequently Pain Interference with Therapy Activities: 0. Does not apply - I have not received rehabilitationtherapy in the past 5 days Pain Interference with Day-to-Day Activities: 1. Rarely or not at all Cognition Overall Cognitive Status:  Within Functional Limits for tasks assessed Arousal/Alertness: Awake/alert Orientation Level: Oriented X4 Memory:  Appears intact Awareness: Appears intact Problem Solving: Appears intact Reasoning: Appears intact Organizing: Appears intact Decision Making: Appears intact Initiating: Appears intact Self Monitoring: Appears intact Safety/Judgment: Appears intact Sensation Sensation Light Touch: Impaired by gross assessment Proprioception: Appears Intact Additional Comments: decreased sensation along R lower leg due to ace wraps/soft cast Coordination Gross Motor Movements are Fluid and Coordinated: No Fine Motor Movements are Fluid and Coordinated: No Coordination and Movement Description: altered balance strategies due to RLE NWB precautions Finger Nose Finger Test: WFL on RUE, slower on LUE but ROM WFL Heel Shin Test: [facility] bilaterally Motor  Motor Motor: Within Functional Limits Motor - Skilled Clinical Observations: altered balance strategies due to RLE NWB precautions  Mobility Bed Mobility Bed Mobility: Rolling Right;Rolling Left;Sit to Supine;Supine to Sit Rolling Right: Independent with assistive device Rolling Left: Independent with assistive device Supine to Sit: Independent with assistive device Sit to Supine: Independent with assistive device Transfers Transfers: Sit to Stand;Stand to Sit;Stand Pivot Transfers Sit to Stand: Independent with assistive device Stand to Sit: Independent with assistive device Stand Pivot Transfers: Independent with assistive device Transfer (Assistive device): Left platform walker Locomotion  Gait Ambulation: Yes Gait Assistance: Supervision/Verbal cueing Gait Distance (Feet): 60 Feet Assistive device: Left platform walker Gait Assistance Details: Verbal cues for gait pattern Gait Assistance Details: occasional cues for energy conservation and PFRW safety Gait Gait: Yes Gait Pattern: Step-to pattern;Decreased step length -  left;Decreased stride length;Decreased dorsiflexion - left;Poor foot clearance - left Gait velocity: decreased Stairs / Additional Locomotion Stairs: No Pick up small object from the floor assist level: Dependent - Patient 0% Wheelchair Mobility Wheelchair Mobility: Yes Wheelchair Assistance: Independent with assistive device Wheelchair Propulsion: Both upper extremities;Left lower extremity Distance: 127f  Trunk/Postural Assessment  Cervical Assessment Cervical Assessment: Within Functional Limits Thoracic Assessment Thoracic Assessment: Exceptions to WFL (decreased ROM/rigid) Lumbar Assessment Lumbar Assessment: Exceptions to [facility](limited by LSO) Postural Control Postural Control: Within Functional Limits  Balance Balance Balance Assessed: Yes Static Sitting Balance Static Sitting - Balance Support: Feet supported;Bilateral upper extremity supported Static Sitting - Level of Assistance: 7: Independent Dynamic Sitting Balance Dynamic Sitting - Balance Support: Feet supported;No upper extremity supported Dynamic Sitting - Level of Assistance: 6: Modified independent (Device/Increase time) Static Standing Balance Static Standing - Balance Support: Bilateral upper extremity supported (L PFRW) Static Standing - Level of Assistance: 6: Modified independent (Device/Increase time) Dynamic Standing Balance Dynamic Standing - Balance Support: Bilateral upper extremity supported (L PFRW) Dynamic Standing - Level of Assistance: 6: Modified independent (Device/Increase time) Dynamic Standing - Comments: with transfers Extremity Assessment  RLE Assessment RLE Assessment: Exceptions to [facility] Strength Right Hip Flexion: 4/5 Right Hip ABduction: 4/5 Right Hip ADduction: 4-/5 Right Knee Flexion: 3+/5 Right Knee Extension: 4/5 LLE Assessment LLE Assessment: Exceptions to [facility] Strength Left Hip Flexion: 4/5 Left Hip ABduction: 4/5 Left Hip ADduction: 4/5 Left Knee Flexion:  4/5 Left Knee Extension: 4/5 Left Ankle Dorsiflexion: 4+/5 Left Ankle Plantar Flexion: 4+/5  AAlfonse AlpersPT, DPT  01/17/2022, 6:55 AM

## 2022-01-17 NOTE — Progress Notes (Signed)
Physical Therapy Session Note  Patient Details  Name: Terri Drake MRN: [id number] Date of Birth: 08-09-1961  Today's Date: 01/17/2022 PT Individual Time: 0938-1005 PT Individual Time Calculation (min): 27 min   Short Term Goals: Week 2:  PT Short Term Goal 1 (Week 2): STG=LTG due to LOS  Skilled Therapeutic Interventions/Progress Updates:    Pt received sitting in w/c and agreeable to therapy session. Pt already wearing LSO and remained donned throughout session. Pt using ice for pain management on L UE scapula. B UE and L LE w/c propulsion ~141ft towards therapy gym mod-I. Transported remainder of distance dependently for time management. Simulated stand pivot car transfer (approximate [employer] Outback height) using L PFRW (pt uses L hand on RW handle as opposed to platform stating she can do that for transfers but longer distance gait requires use of platform) - completed car transfer with supervision for safety and min cuing on how to manage w/c parts.   B UE and L LE w/c propulsion ~95ft up/down ramp with CGA/min assist for safety and to prevent rolling backwards during ascent - pt states she is still awaiting delivery of the anti-tippers for her personal wheelchair.  Gait training ~21ft up/down ramp using L PFRW (using platform) with CGA for safety/steadying and pt demoing adequate foot clearance and safe management of AD with min verbal cuing as well as ability to maintain R LE NWBing without cuing. Pt reports she feels more confident with her walking.   Pt reports no additional questions/concerns for PT at this time and states she is ready and excited for D/C home tomorrow.  Transported back to room and left seated in w/c in prep for OT session.    Therapy Documentation Precautions:  Precautions Precautions: Back, Fall Precaution Comments: monitor BP Required Braces or Orthoses: Sling, Other Brace Other Brace: LSO for OOB, sling for comfort (allowed to move  LUE) Restrictions Weight Bearing Restrictions: Yes LUE Weight Bearing: Weight bearing as tolerated RLE Weight Bearing: Non weight bearing   Pain:  Pt utilizing ice on L UE scapula upon therapist arrival for pain management otherwise no interventions needed during session.   Therapy/Group: Individual Therapy  [name] , PT, DPT, NCS, CSRS 01/17/2022, 7:58 AM

## 2022-01-18 DIAGNOSIS — G8918 Other acute postprocedural pain: Secondary | ICD-10-CM

## 2022-01-18 DIAGNOSIS — T148XXA Other injury of unspecified body region, initial encounter: Secondary | ICD-10-CM

## 2022-01-18 NOTE — Progress Notes (Signed)
PROGRESS NOTE   Subjective/Complaints: Concerned by increased bruising and pain in left upper extremity Hopped 110 times yesterday! Blood pressure dropped less yesterday  ROS: Patient denies fever, rash, sore throat, blurred vision, dizziness, nausea, vomiting, diarrhea, cough, shortness of breath or chest pain,  headache, or mood change.    Objective:   No results found. No results for input(s): WBC, HGB, HCT, PLT in the last 72 hours.   No results for input(s): NA, K, CL, CO2, GLUCOSE, BUN, CREATININE, CALCIUM in the last 72 hours.     Intake/Output Summary (Last 24 hours) at 01/18/2022 0851 Last data filed at 01/17/2022 1818 Gross per 24 hour  Intake 480 ml  Output --  Net 480 ml        Physical Exam: Vital Signs Blood pressure (!) 117/46, pulse (!) 52, temperature 98 F (36.7 C), temperature source Oral, resp. rate 17, height 5\' 7"  (1.702 m), weight 88.5 kg, SpO2 98 %. Constitutional: No distress . Vital signs reviewed. HEENT: NCAT, EOMI, oral membranes moist Neck: supple Cardiovascular: Bradycardia Respiratory/Chest: CTA Bilaterally without wheezes or rales. Normal effort    GI/Abdomen: BS +, non-tender, non-distended Ext: + LUE edema Psych: pleasant and cooperative  Skin: splint in place to right lower extremity. Bruising of left arm. Would care dressing to left knee abraison Neuro: Alert x3 and follows commands Sit to stand MinA MSK: improved range of motion of left shoulder to 70 degrees,- Active ROM; can lift higher passively--she tolerates pain with PROM   Assessment/Plan: 1. Functional deficits which require 3+ hours per day of interdisciplinary therapy in a comprehensive inpatient rehab setting. Physiatrist is providing close team supervision and 24 hour management of active medical problems listed below. Physiatrist and rehab team continue to assess barriers to discharge/monitor patient progress  toward functional and medical goals  Care Tool:  Bathing    Body parts bathed by patient: Right arm, Left arm, Chest, Abdomen, Front perineal area, Right upper leg, Left upper leg, Left lower leg, Face, Buttocks   Body parts bathed by helper: Buttocks Body parts n/a: Right lower leg (cast)   Bathing assist Assist Level: Independent with assistive device     Upper Body Dressing/Undressing Upper body dressing   What is the patient wearing?: Button up shirt    Upper body assist Assist Level: Independent with assistive device    Lower Body Dressing/Undressing Lower body dressing      What is the patient wearing?: Underwear/pull up, Pants     Lower body assist Assist for lower body dressing: Independent with assitive device Assistive Device Comment: reacher   Toileting Toileting    Toileting assist Assist for toileting: Independent with assistive device     Transfers Chair/bed transfer  Transfers assist     Chair/bed transfer assist level: Independent with assistive device Chair/bed transfer assistive device:   Ambulation assist   Ambulation activity did not occur: Safety/medical concerns  Assist level: Supervision/Verbal cueing Assistive device: Walker-platform Max distance: 43ft   Walk 10 feet activity   Assist  Walk 10 feet activity did not occur: Safety/medical concerns (low BP, decreased balance, pain)  Assist level: Supervision/Verbal cueing Assistive  device: Walker-platform   Walk 50 feet activity   Assist Walk 50 feet with 2 turns activity did not occur: Safety/medical concerns (low BP, decreased balance, pain)  Assist level: Supervision/Verbal cueing Assistive device: Walker-platform    Walk 150 feet activity   Assist Walk 150 feet activity did not occur: Safety/medical concerns (fatigue, weakness, BP)         Walk 10 feet on uneven surface  activity   Assist Walk 10 feet on uneven surfaces  activity did not occur: Safety/medical concerns (fatigue, weakness, BP)         Wheelchair     Assist Is the patient using a wheelchair?: Yes Type of Wheelchair: Manual Wheelchair activity did not occur: Safety/medical concerns (low BP)  Wheelchair assist level: Independent Max wheelchair distance: >17650ft    Wheelchair 50 feet with 2 turns activity    Assist    Wheelchair 50 feet with 2 turns activity did not occur: Safety/medical concerns (low BP)   Assist Level: Independent   Wheelchair 150 feet activity     Assist  Wheelchair 150 feet activity did not occur: Safety/medical concerns (low BP)   Assist Level: Independent   Blood pressure (!) 117/46, pulse (!) 52, temperature 98 F (36.7 C), temperature source Oral, resp. rate 17, height 5\' 7"  (1.702 m), weight 88.5 kg, SpO2 98 %.  Medical Problem List and Plan: 1. Functional deficits secondary to closed right trimalleolar ankle fracture dislocation status post ORIF and closed left scapular body fracture nonoperative with conservative care.  Nonweightbearing right lower extremity x8 weeks with splint x2 weeks then convert to CAM for range of motion exercises.  Weightbearing as tolerated left upper extremity with sling for comfort.             -patient may not shower             -ELOS/Goals: 5/31, modI            f/u with me next available.   D/c home today 2.  Atrial fibrillation: Vascular study reviewed and is negative for clot.  Convert to Xarelto 10 mg daily x30 days now to minimize injections. Giver 12.5 lopressor now for afib on EKG and restart Toprol XL HS. Referred for outpatient cardiology, repeat EKG tomorrow morning. Will ask cardiology to follow this weekend.   5/27- not in Afib on exam or on EKG this AM- however had some changes on EKG- want Cards to take a look- could be lead changes- no Chest pain or SOB- no Cardiac Sx's. Will check CBC/CMP and Mg to make sure electrolytes/infection aren't causing her  to go into Afib.   5/28- Mg 2.2; K+ 4.3; no changes on labs that would potentiate A fib.   5/31: discussed cardiology follow-up             -antiplatelet therapy: N/A 3. Pain: continue Lidoderm patch, Discontinue Robaxin and oxycodone on discharge. Discussed amitriptyline at night but she refuses given potential side effects 4. Insomnia: Continue 5-10mg  oxycodone HS prn to help her sleep. Discussed amitriptyline but patient defers.  5. Neuropsych: This patient is capable of making decisions on her own behalf. 6. Increased bruising left upper extremity: discussed could be associated with increased mobility, presence of muscle tears, Xarelto 7. Fluids/Electrolytes/Nutrition: Routine in and outs with follow-up chemistries 8.  T11/12 endplate fracture.  Lumbar corset as directed per Dr. Conchita ParisNundkumar 9.  Left rib fractures 2 and 6.  Conservative care 10.  Urinary retention.  Discontinue [name]. 5/27- is  voiding well  12.  Constipation.  MiraLAX daily, Colace 100 mg twice daily. Start magnesium gluconate 250mg  HS. Check magnesium level tomorrow morning.   5/27- mg level this AM  5/28- Mg 2.2 13. Vitamin D deficiency: Level is 17: started ergocalciferol 50,000U once per week every 7 weeks on 5/17.  14. Transaminitis: Tylenol changed to PRN, repeat CMP Monday.  15. Hypotension: abdominal binder and TEDs ordered for therapy- did not need today since AM afib meds were held.  16. Bradycardia: Toprol changed to 25mg  XL HS, continue this dose for now. Discussed outpatient follow-up with cardiology.  17. Left scapular fracture with worsening pain: MRI ordered and results discussed with patient. Discussed expected healing time frame.  18. Hypotension: improved, continue abdominal binder and TEDs.   -Cardiology recommending increased salt intake, continue to monitor  -discussed with cardiology weaning off lopressor given hypotension with therapy but in afib again today on EKG, repeat EKG tomorrow,  reconsulting cardiology, and restarting Toprol XL HS.  Discussed midodrine 2.5mg  prior to therapy to help with hypotension but patient defers at this time. Improved, continue metoprolol 19. E coli UTI: start keflex  5/27- pan sensitive so will con't Keflex for at least 5 days.    >30 minutes spent in discharge of patient including review of medications and follow-up appointments, physical examination, and in answering all patient's questions       LOS: 14 days A FACE TO FACE EVALUATION WAS PERFORMED  Terri Drake 01/18/2022, 8:51 AM

## 2022-01-18 NOTE — Progress Notes (Signed)
INPATIENT REHABILITATION DISCHARGE NOTE   Discharge instructions by:Dan PA  Verbalized understanding:yes  Skin care/Wound care healing?done  Pain:none  IV's:none  Tubes/Drains:none  O2:none  Safety instructions:done  Patient belongings:done  Discharged ZT:[id number]   Discharged [id number]  Jelan Batterton RNC,BSN, WTA

## 2022-01-18 NOTE — Telephone Encounter (Signed)
Lidocaine Patch 5% has been denied by patients insurance.

## 2022-01-20 ENCOUNTER — Telehealth: Payer: Self-pay | Admitting: *Deleted

## 2022-01-20 DIAGNOSIS — S42109A Fracture of unspecified part of scapula, unspecified shoulder, initial encounter for closed fracture: Secondary | ICD-10-CM

## 2022-01-20 DIAGNOSIS — S2242XA Multiple fractures of ribs, left side, initial encounter for closed fracture: Secondary | ICD-10-CM

## 2022-01-20 NOTE — Telephone Encounter (Signed)
Beth Hagler NCM for [facility] called to report that they have yet to be able to staff any [facility] visits for this patient.  She has appt with ortho next week and has w/c with ramp and home exercises she is doing right now.

## 2022-01-24 NOTE — Telephone Encounter (Signed)
[employer] notified us that they cannot staff this aptient because she is workmans comp and they do not provide that service. I have let Beth Hagler NCM know and she asks that we just put in an order for outpt OT therapy and fax it to her since they are having no luck with [facility] services. OT ordered and faxed to [facility].

## 2022-01-26 ENCOUNTER — Encounter: Payer: Self-pay | Admitting: Physical Medicine & Rehabilitation

## 2022-01-30 ENCOUNTER — Encounter: Payer: No Typology Code available for payment source | Admitting: Physical Medicine & Rehabilitation

## 2022-02-03 ENCOUNTER — Ambulatory Visit (HOSPITAL_COMMUNITY)
Admission: RE | Admit: 2022-02-03 | Discharge: 2022-02-03 | Disposition: A | Discharge status: Home / Self Care | Payer: No Typology Code available for payment source | Source: Ambulatory Visit | Attending: Physician Assistant | Admitting: Physician Assistant

## 2022-02-03 VITALS — BP 128/74 | HR 51 | Ht 67.0 in

## 2022-02-03 DIAGNOSIS — I4819 Other persistent atrial fibrillation: Secondary | ICD-10-CM | POA: Insufficient documentation

## 2022-02-03 DIAGNOSIS — Z79899 Other long term (current) drug therapy: Secondary | ICD-10-CM | POA: Insufficient documentation

## 2022-02-03 DIAGNOSIS — Z7901 Long term (current) use of anticoagulants: Secondary | ICD-10-CM | POA: Insufficient documentation

## 2022-02-03 MED ORDER — METOPROLOL SUCCINATE ER 25 MG PO TB24: 25.0000 mg | ORAL_TABLET | Freq: Every day | ORAL | 6 refills | Status: DC

## 2022-02-03 MED ORDER — FLECAINIDE ACETATE 100 MG PO TABS: 100.0000 mg | ORAL_TABLET | Freq: Two times a day (BID) | ORAL | 6 refills | Status: DC

## 2022-02-03 NOTE — Progress Notes (Addendum)
Primary Care Physician: Moshe Cipro, NP Primary Cardiologist: none Primary Electrophysiologist: none Referring Physician: Dr Oneida Arenas is a 61 y.o. female with a history of new onset atrial fibrillation who presents for follow up in the [facility].  The patient was initially diagnosed with atrial fibrillation 01/13/21 incidentally at a routine follow up at PCP. ECG showed afib with RVR. Patient is on Eliquis for a CHADS2VASC score of 1. She was also started on metoprolol for rate control. She denies significant snore but does have a history of alcohol and caffeine use. Patient presented to the ED 04/03/21 with symptoms of elevated heart rate, nausea, and lightheadedness. She was rate controlled on a higher dose of BB and discharged. There were no specific triggers that she could identify.   On 12/27/21 patient was admitted after being struck by a large falling tree branch. She sustained T11/12 endplate fracture, right ankle fracture s/p ORIF, multiple rib fractures, left pneumothorax and a left scapula fracture. She did have intermittent runs of rapid afib during her admission. Hypotension limited the up titration of rate control.   On follow up today, patient reports that she has done well from an afib standpoint since leaving the hospital. Her heart rates have primarily been in the low 50s but she feels well. Currently on Xarelto for DVT prophylaxis.   Today, she denies symptoms of palpitations, chest pain, shortness of breath, orthopnea, PND, lower extremity edema, presyncope, syncope, snoring, daytime somnolence, bleeding, or neurologic sequela. The patient is tolerating medications without difficulties and is otherwise without complaint today.    Atrial Fibrillation Risk Factors:  she does not have symptoms or diagnosis of sleep apnea. she does not have a history of rheumatic fever. she does have a history of alcohol use. The patient  does not have a history of early familial atrial fibrillation or other arrhythmias.  she has a BMI of Body mass index is 30.56 kg/m.Marland Kitchen There were no vitals filed for this visit.    Family History  Problem Relation Age of Onset   Heart disease Neg Hx        in either parent     Atrial Fibrillation Management history:  Previous antiarrhythmic drugs: flecainide  Previous cardioversions: none Previous ablations: none CHADS2VASC score: 1 Anticoagulation history: Eliquis   Past Medical History:  Diagnosis Date   Ankle dislocation, right, initial encounter 12/30/2021   Ankle syndesmosis disruption, right, initial encounter 12/30/2021   Closed displaced trimalleolar fracture of right ankle 12/30/2021   Dysrhythmia    a-fib   Paroxysmal atrial fibrillation [facility])    Past Surgical History:  Procedure Laterality Date   ORIF ANKLE FRACTURE Right 12/29/2021   Procedure: OPEN REDUCTION INTERNAL FIXATION (ORIF) ANKLE FRACTURE;  Surgeon: Myrene Galas, MD;  Location: [facility] OR;  Service: Orthopedics;  Laterality: Right;    Current Outpatient Medications  Medication Sig Dispense Refill   acetaminophen (TYLENOL) 325 MG tablet Take 1-2 tablets (325-650 mg total) by mouth every 4 (four) hours as needed for mild pain.     ascorbic acid (VITAMIN C) 500 MG tablet Take 1 tablet (500 mg total) by mouth daily. 30 tablet 0   latanoprost (XALATAN) 0.005 % ophthalmic solution Place 1 drop into both eyes at bedtime.     magnesium gluconate (MAGONATE) 500 MG tablet Take 0.5 tablets (250 mg total) by mouth at bedtime. 30 tablet 0   rivaroxaban (XARELTO) 10 MG TABS tablet 1 tab daily until 02/11/2022  and stop 30 tablet 0   Vitamin D, Ergocalciferol, (DRISDOL) 1.25 MG (50000 UNIT) CAPS capsule Take 1 capsule (50,000 Units total) by mouth every 7 (seven) days. 5 capsule 0   zinc sulfate 220 (50 Zn) MG capsule Take 1 capsule (220 mg total) by mouth daily. 30 capsule 0   flecainide (TAMBOCOR) 100 MG tablet Take 1  tablet (100 mg total) by mouth every 12 (twelve) hours. 60 tablet 6   metoprolol succinate (TOPROL-XL) 25 MG 24 hr tablet Take 1 tablet (25 mg total) by mouth at bedtime. 30 tablet 6   No current facility-administered medications for this encounter.    Allergies  Allergen Reactions   Elemental Sulfur     REACTION: hives   Gluten Meal Diarrhea   Sulfa Antibiotics Rash    Social History   Socioeconomic History   Marital status: Married    Spouse name: Not on file   Number of children: Not on file   Years of education: Not on file   Highest education level: Not on file  Occupational History   Not on file  Tobacco Use   Smoking status: Never   Smokeless tobacco: Never   Tobacco comments:    Never smoke 11/04/21  Vaping Use   Vaping Use: Never used  Substance and Sexual Activity   Alcohol use: Yes    Alcohol/week: 5.0 - 10.0 standard drinks of alcohol    Types: 5 - 10 Glasses of wine per week    Comment: 1-2 glasses nightly 08/05/2021   Drug use: Never   Sexual activity: Not on file  Other Topics Concern   Not on file  Social History Narrative   Not on file   Social Determinants of Health   Financial Resource Strain: Not on file  Food Insecurity: Not on file  Transportation Needs: Not on file  Physical Activity: Not on file  Stress: Not on file  Social Connections: Not on file  Intimate Partner Violence: Not on file     ROS- All systems are reviewed and negative except as per the HPI above.  Physical Exam: Vitals:   02/03/22 0839  BP: 128/74  Pulse: (!) 51  Height: 5\' 7"  (1.702 m)     GEN- The patient is a well appearing female, alert and oriented x 3 today.   HEENT-head normocephalic, atraumatic, sclera clear, conjunctiva pink, hearing intact, trachea midline. Lungs- Clear to ausculation bilaterally, normal work of breathing Heart- Regular rate and rhythm, no murmurs, rubs or gallops  GI- soft, NT, ND, + BS Extremities- no clubbing, cyanosis, or  edema MS- no significant deformity or atrophy Skin- no rash or lesion Psych- euthymic mood, full affect Neuro- strength and sensation are intact   Wt Readings from Last 3 Encounters:  01/04/22 88.5 kg  12/27/21 86.6 kg  11/04/21 88.3 kg    EKG today demonstrates  SB Vent. rate 51 BPM PR interval 184 ms QRS duration 88 ms QT/QTcB 480/442 ms  Echo 01/02/22 demonstrated   1. Left ventricular ejection fraction, by estimation, is 65 to 70%. The  left ventricle has hyperdynamic function. The left ventricle has no  regional wall motion abnormalities. Left ventricular diastolic parameters  are indeterminate.   2. Right ventricular systolic function is normal. The right ventricular  size is normal. Tricuspid regurgitation signal is inadequate for assessing PA pressure.   3. The mitral valve is normal in structure. No evidence of mitral valve  regurgitation. No evidence of mitral stenosis.   4.  The aortic valve is normal in structure. Aortic valve regurgitation is  not visualized. No aortic stenosis is present.   5. The inferior vena cava is normal in size with greater than 50%  respiratory variability, suggesting right atrial pressure of 3 mmHg.   Epic records are reviewed at length today   CHA2DS2-VASc Score = 1  The patient's score is based upon: CHF History: 0 HTN History: 0 Diabetes History: 0 Stroke History: 0 Vascular Disease History: 0 Age Score: 0 Gender Score: 1          ASSESSMENT AND PLAN: 1. Persistent atrial fibrillation  The patient's CHA2DS2-VASc score is 1, indicating a 0.6% annual risk of stroke. Patient appears to be maintaining SR. Suspect recent rapid episodes 2/2 trauma/surgery. Continue flecainide 100 mg BID Continue Toprol 25 mg daily, could consider decreasing to 12.5 mg if she becomes symptomatic with bradycardia.  Anticoagulation not indicated at this time with low CV score, only on Xarelto 10 mg for DVT prophylaxis.    Follow up in the AF  clinic in 4-5 months.    Jorja Loa PA-C Af[facility] [address] [place], [place] [address] [phone] 02/03/2022 9:30 AM

## 2022-02-14 ENCOUNTER — Encounter: Payer: Self-pay | Admitting: Physical Medicine & Rehabilitation

## 2022-02-14 ENCOUNTER — Encounter
Payer: No Typology Code available for payment source | Attending: Physical Medicine & Rehabilitation | Admitting: Physical Medicine & Rehabilitation

## 2022-02-14 VITALS — BP 118/75 | HR 54 | Ht 67.0 in | Wt 185.0 lb

## 2022-02-14 DIAGNOSIS — S22088D Other fracture of T11-T12 vertebra, subsequent encounter for fracture with routine healing: Secondary | ICD-10-CM | POA: Diagnosis present

## 2022-02-14 DIAGNOSIS — S82851D Displaced trimalleolar fracture of right lower leg, subsequent encounter for closed fracture with routine healing: Secondary | ICD-10-CM | POA: Insufficient documentation

## 2022-02-14 DIAGNOSIS — S42102D Fracture of unspecified part of scapula, left shoulder, subsequent encounter for fracture with routine healing: Secondary | ICD-10-CM | POA: Insufficient documentation

## 2022-02-14 DIAGNOSIS — I951 Orthostatic hypotension: Secondary | ICD-10-CM | POA: Insufficient documentation

## 2022-02-14 DIAGNOSIS — M25571 Pain in right ankle and joints of right foot: Secondary | ICD-10-CM | POA: Insufficient documentation

## 2022-02-14 NOTE — Progress Notes (Signed)
Subjective:    Patient ID: Terri Drake, female    DOB: 10/01/1960, 61 y.o.   MRN: [id number]  HPI  Brief HPI:   Terri Drake is a 61 y.o. right-handed female with history of hypertension occasional alcohol use atrial fibrillation maintained on Tambocor as well as Toprol.  Per chart review lives with spouse 1 level home independent prior to admission.  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  Admission chemistries unremarkable alcohol negative.  Cranial CT scan as well as CT cervical spine negative.  CT thoracic spine with possible acute mild superior endplate fractures at T11 and T12.  CT of chest abdomen pelvis showed no evidence of acute solid organ injury.  There was an acute displaced left inferior scapular fracture as well as acute nondisplaced left second rib fracture as well as sixth rib fracture with small amount of soft tissue gas between the first second ribs on the left side.  Mild left apical pleural thickening suspect secondary small hematoma with adjacent rib trauma.  Trace left apical pneumothorax.  Left shoulder film showed up identified comminuted left scapular body fracture with no additional fracture left shoulder.  CT of the right ankle identified displaced and comminuted trimalleolar fracture as well as slightly decreased density of the lateral talar dome suspicious for impaction injury.  Underwent ORIF of right trimalleolar ankle fracture without fixation of posterior lip/ORIF of the syndesmosis 12/29/2021 per Dr. Carola Frost.  Nonweightbearing right lower extremity x8 weeks with splint x2 weeks then convert to cam for range of motion exercises.  In regards to patient's left scapular body fracture weightbearing as tolerated no surgical intervention.  In regards to patient's T11-12 endplate fractures discussed with Dr. Conchita Paris lumbar corset as directed.   Currently maintained on Lovenox for DVT prophylaxis converting to Xarelto 10 mg daily for 30 days for DVT prophylaxis.  Hospital course cardiology service did follow-up in regards to patient's history of atrial fibrillation intermittent RVR currently remains on flecainide as well as Lopressor monitoring for any bradycardia.  Echocardiogram with ejection fraction of 65 to 70% no wall motion abnormalities.  Bouts of urinary retention Foley tube initially placed.  Therapy evaluations completed due to patient decreased functional mobility was admitted for comprehensive rehab program.     Hospital Course: [name] was admitted to rehab 01/04/2022 for inpatient therapies to consist of PT, ST and OT at least three hours five days a week. Past admission physiatrist, therapy team and rehab RN have worked together to provide customized collaborative inpatient rehab.  Pertaining to patient's closed right trimalleolar ankle fracture dislocation status post ORIF and closed left scapular body fracture nonoperative with conservative care.  Nonweightbearing right lower extremity x8 weeks with splint x2 weeks then convert to cam for range of motion exercises.  Weightbearing as tolerated left upper extremity sling for comfort.  She continued on Xarelto for DVT prophylaxis.  Venous Doppler studies negative.  No bleeding episodes.  Pain managed use of Lidoderm patch Robaxin scheduled oxycodone as needed.  T11-12 endplate fractures lumbar corset conservative care per Dr. Conchita Paris.  Left rib fractures 2-6 conservative care.  Atrial fibrillation follow-up cardiology services flecainide as advised as well as Lopressor which was tapered to off due to bradycardia.  Bouts of constipation resolved with laxative assistance.  She did have some hypotension as noted above follow-up per cardiology services.  Urine culture  100,000 E. coli completing course of Keflex.    Interval History 61 year old female here for follow-up due to  polytrauma after she was struck by a falling tree branch.  She reports she is doing well overall.  Patient reports outpatient PT and OT were delayed but better but are now starting this week.  She has not required use of oxycodone or Robaxin.  She has been seen by her PCP who will be starting vitamin D 2000 units daily after her high-dose vitamin D treatment is completed.  She reports she was followed by orthopedics who have upgraded her to a cam boot for the right lower extremity and she is also now toe-touch weightbearing.  She has follow-up with orthopedics in July.  She has been seen by her cardiologist and will be continuing metoprolol and flecainide.  She is no longer having difficulty with orthostatic hypotension.  Denies any symptoms of UTI.  She reports she has difficulty transferring to her shower. She would like f/u with ortho to discuss healing process of scapula fracture.   Pain Inventory Average Pain 3 Pain Right Now 3 My pain is  na  In the last 24 hours, has pain interfered with the following? General activity 4 Relation with others 1 Enjoyment of life 10 What TIME of day is your pain at its worst? night Sleep (in general) Poor  Pain is worse with: some activites Pain improves with: heat/ice Relief from Meds:  na  walk without assistance walk with assistance use a cane use a walker ability to climb steps?  no do you drive?  yes use a wheelchair transfers alone  employed # of hrs/week . 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  no  Any changes since last visit?  no    Family History  Problem Relation Age of Onset   Heart disease Neg Hx        in either parent   Social History   Socioeconomic History   Marital status: Married    Spouse name: Not on file   Number of children: Not on file   Years of education: Not on file    Highest education level: Not on file  Occupational History   Not on file  Tobacco Use   Smoking status: Never   Smokeless tobacco: Never   Tobacco comments:    Never smoke 11/04/21  Vaping Use   Vaping Use: Never used  Substance and Sexual Activity   Alcohol use: Yes    Alcohol/week: 5.0 - 10.0 standard drinks of alcohol    Types: 5 - 10 Glasses of wine per week    Comment: 1-2 glasses nightly 08/05/2021   Drug use: Never   Sexual activity: Not on file  Other Topics Concern   Not on file  Social History Narrative   Not on file   Social Determinants of Health   Financial Resource Strain: Not on file  Food Insecurity: Not on file  Transportation Needs: Not on file  Physical Activity: Not on file  Stress: Not on file  Social Connections: Not on file   Past Surgical History:  Procedure Laterality Date   ORIF ANKLE FRACTURE Right 12/29/2021   Procedure: OPEN REDUCTION INTERNAL FIXATION (ORIF) ANKLE FRACTURE;  Surgeon: Myrene Galas, MD;  Location: [facility] OR;  Service: Orthopedics;  Laterality: Right;   Past Medical History:  Diagnosis Date   Ankle dislocation, right, initial encounter 12/30/2021   Ankle syndesmosis disruption, right, initial encounter 12/30/2021   Closed displaced trimalleolar fracture of right ankle 12/30/2021   Dysrhythmia    a-fib   Paroxysmal atrial fibrillation (HCC)    BP 118/75   Pulse (!) 54   Ht 5\' 7"  (1.702 m)   Wt 185 lb (83.9 kg) Comment: reported  SpO2 97%   BMI 28.98 kg/m   Opioid Risk Score:   Fall Risk Score:  `1  Depression screen [facility] 2/9     02/14/2022   11:00 AM  Depression screen PHQ 2/9  Decreased Interest 1  Down, Depressed, Hopeless 1  PHQ - 2 Score 2  Altered sleeping 3  Tired, decreased energy 2  Change in appetite 0  Feeling bad or failure about yourself  0  Trouble concentrating 1  Moving slowly or fidgety/restless 0  Suicidal thoughts 0  PHQ-9 Score 8  Difficult doing work/chores Somewhat difficult      Review of Systems  Constitutional: Negative.   HENT: Negative.    Eyes: Negative.   Respiratory:  Positive for cough.   Cardiovascular: Negative.   Gastrointestinal: Negative.   Endocrine: Negative.   Genitourinary: Negative.   Musculoskeletal:  Positive for back pain.  Skin: Negative.   Allergic/Immunologic: Negative.   Neurological: Negative.   Hematological: Negative.   Psychiatric/Behavioral:  Positive for dysphoric mood.   All other systems reviewed and are negative.     Objective:   Physical Exam   Gen: no distress, normal appearing HEENT: oral mucosa pink and moist, NCAT Cardio: Mild bradycardia Chest: normal effort, normal rate of breathing Abd: soft, non-distended, Lumbar corset Ext: no edema Psych: pleasant, normal affect Skin: intact Neuro: Alert and oriented, follows commands, cranial nerves II through XII grossly intact, sensation intact light touch in all 4 extremities Musculoskeletal: Wearing cam boot on the right lower extremity Able to abduct left shoulder to about 80 degrees actively, passively able to Abduct further  Strength 5 out of 5 in left lower extremity and right upper extremity Strength at least 3 - 4- out of 5 in the left upper extremity Strength at least 4 out of 5 in the proximal right lower extremity Surgical incisions at the medial and lateral ankle of the right lower extremity appear to be healing well with no signs of infection      Assessment & Plan:   Closed right trimalleolar ankle fracture dislocation status post ORIF and closed left scapular body fracture nonoperative with conservative care.  T11/12 endplate fracture wearing Lumbar corset. -Continue PT/OT, work on shower transfers -She reports CAM Boot started by ortho last visit with toe touch weight bearing. -Ortho consult placed for scapula fx f/u        2.  Atrial fibrillation:   -Following with Cardiology  3. Pain: Pt says she is not requiring Robaxin or oxycodone. Cont  tylenol PRN for pain. She only uses this occasionally.  4. Vitamin D deficiency: Completing ergocalciferol 50,000U she is transitioning to 2000u daily per PCP  5. Hypotension, she reports this has improved since discharge  6.T11/12 endplate fracture.  Lumbar corset, fu with Dr. Kathyrn Sheriff  19. E coli UTI: start keflex             5/27- pan sensitive so will con't Keflex for at least 5 days.

## 2022-02-24 ENCOUNTER — Telehealth: Payer: Self-pay | Admitting: *Deleted

## 2022-02-24 NOTE — Telephone Encounter (Signed)
Form was brought to office by Mrs Schaible's husband to be signed and faxed back to [facility] for add OT therapy. Form signed by Dr Natale Lay and faxed to [facility]. Mrs Hummel notified that this was done.

## 2022-03-01 ENCOUNTER — Ambulatory Visit: Payer: PRIVATE HEALTH INSURANCE | Admitting: Orthopedic Surgery

## 2022-06-30 ENCOUNTER — Ambulatory Visit (HOSPITAL_COMMUNITY)
Admission: RE | Admit: 2022-06-30 | Discharge: 2022-06-30 | Disposition: A | Discharge status: Home / Self Care | Payer: No Typology Code available for payment source | Source: Ambulatory Visit | Attending: Physician Assistant | Admitting: Physician Assistant

## 2022-06-30 ENCOUNTER — Encounter (HOSPITAL_COMMUNITY): Payer: Self-pay | Admitting: Physician Assistant

## 2022-06-30 VITALS — BP 122/78 | HR 46 | Ht 67.0 in | Wt 187.0 lb

## 2022-06-30 DIAGNOSIS — I4819 Other persistent atrial fibrillation: Secondary | ICD-10-CM | POA: Diagnosis not present

## 2022-06-30 DIAGNOSIS — Z7901 Long term (current) use of anticoagulants: Secondary | ICD-10-CM | POA: Diagnosis not present

## 2022-06-30 MED ORDER — METOPROLOL SUCCINATE ER 25 MG PO TB24: 25.0000 mg | ORAL_TABLET | Freq: Every day | ORAL | 3 refills | Status: DC

## 2022-06-30 MED ORDER — FLECAINIDE ACETATE 100 MG PO TABS: 100.0000 mg | ORAL_TABLET | Freq: Two times a day (BID) | ORAL | 3 refills | Status: DC
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
## 2022-09-20 DIAGNOSIS — U071 COVID-19: Secondary | ICD-10-CM

## 2022-09-20 HISTORY — DX: COVID-19: U07.1

## 2022-09-25 ENCOUNTER — Encounter: Payer: No Typology Code available for payment source | Admitting: Physical Medicine and Rehabilitation

## 2022-10-03 ENCOUNTER — Telehealth (HOSPITAL_COMMUNITY): Payer: Self-pay

## 2022-10-03 NOTE — Telephone Encounter (Signed)
Patient called regarding her upcoming surgery for her ankle. Dr. Marcelino Scot is performing her surgery on 10/23/2022. She wanted to know which direction to go about her Flecainide and her Metoprolol. Per Clint Fenton-PA instruct patient to take her Flecainide with a sip of water the morning of her procedure and the night before surgery to continue taking her Metoprolol the night prior. Consulted with patient and he verbalized understanding.

## 2022-10-06 ENCOUNTER — Encounter
Payer: No Typology Code available for payment source | Attending: Physical Medicine and Rehabilitation | Admitting: Physical Medicine and Rehabilitation

## 2022-10-06 ENCOUNTER — Encounter: Payer: Self-pay | Admitting: Physical Medicine and Rehabilitation

## 2022-10-06 VITALS — BP 141/70 | HR 51 | Ht 67.0 in | Wt 190.0 lb

## 2022-10-06 DIAGNOSIS — G629 Polyneuropathy, unspecified: Secondary | ICD-10-CM | POA: Insufficient documentation

## 2022-10-06 DIAGNOSIS — M25571 Pain in right ankle and joints of right foot: Secondary | ICD-10-CM | POA: Diagnosis not present

## 2022-10-06 DIAGNOSIS — T07XXXA Unspecified multiple injuries, initial encounter: Secondary | ICD-10-CM | POA: Insufficient documentation

## 2022-10-06 DIAGNOSIS — S2242XA Multiple fractures of ribs, left side, initial encounter for closed fracture: Secondary | ICD-10-CM | POA: Insufficient documentation

## 2022-10-06 DIAGNOSIS — M791 Myalgia, unspecified site: Secondary | ICD-10-CM | POA: Diagnosis not present

## 2022-10-06 NOTE — Patient Instructions (Signed)
Qutenza  Extracoporeal shockwave therapy
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
## 2022-10-09 ENCOUNTER — Encounter: Payer: No Typology Code available for payment source | Admitting: Physical Medicine and Rehabilitation

## 2022-10-20 ENCOUNTER — Encounter (HOSPITAL_COMMUNITY): Payer: Self-pay | Admitting: Orthopedic Surgery

## 2022-10-20 NOTE — Progress Notes (Signed)
PCP - Faustino Congress, NP Cardiologist - Malka So, PA  Chest x-ray - 01/02/22 (1V) EKG - 06/30/22 Stress Test - 08/11/21 ECHO - 01/02/22 Cardiac Cath - n/a  ICD Pacemaker/Loop - n/a  Sleep Study -  n/a CPAP - none  Diabetes - n/a  ERAS: Clear liquids til 5:00 AM DOS.  Anesthesia review: Yes  STOP now taking any Aspirin (unless otherwise instructed by your surgeon), Aleve, Naproxen, Ibuprofen, Motrin, Advil, Goody's, BC's, all herbal medications, fish oil, and all vitamins.   Coronavirus Screening Do you have any of the following symptoms:  Cough yes/no: No Fever (>100.73F)  yes/no: No Runny nose yes/no: No Sore throat yes/no: No Difficulty breathing/shortness of breath    Have you traveled in the last 14 days and where? yes/no: No  Patient verbalized understanding of instructions that were given via phone.

## 2022-10-20 NOTE — Anesthesia Preprocedure Evaluation (Signed)
Anesthesia Evaluation  Patient identified by MRN, date of birth, ID band Patient awake    Reviewed: Allergy & Precautions, NPO status , Patient's Chart, lab work & pertinent test results  History of Anesthesia Complications Negative for: history of anesthetic complications  Airway Mallampati: II  TM Distance: >3 FB     Dental no notable dental hx. (+) Dental Advisory Given   Pulmonary neg pulmonary ROS   Pulmonary exam normal        Cardiovascular Normal cardiovascular exam+ dysrhythmias Atrial Fibrillation   Echo 01/02/22: IMPRESSIONS   1. Left ventricular ejection fraction, by estimation, is 65 to 70%. The  left ventricle has hyperdynamic function. The left ventricle has no  regional wall motion abnormalities. Left ventricular diastolic parameters  are indeterminate.   2. Right ventricular systolic function is normal. The right ventricular  size is normal. Tricuspid regurgitation signal is inadequate for assessing  PA pressure.   3. The mitral valve is normal in structure. No evidence of mitral valve  regurgitation. No evidence of mitral stenosis.   4. The aortic valve is normal in structure. Aortic valve regurgitation is  not visualized. No aortic stenosis is present.   5. The inferior vena cava is normal in size with greater than 50%  respiratory variability, suggesting right atrial pressure of 3 mmHg.      ETT 08/11/21:   No ST deviation was noted.   Prior study not available for comparison.   IMPRESSIONS Negative for stress induced arrhythmias or QRS widening. Estimated exercise workload is 10 METs, which confers a survival benefit.   RECOMMENDATIONS/CONCLUSIONS Negative ECG Stress test.  Low risk study.      Neuro/Psych negative neurological ROS     GI/Hepatic negative GI ROS, Neg liver ROS,,,  Endo/Other  negative endocrine ROS    Renal/GU negative Renal ROS     Musculoskeletal negative  musculoskeletal ROS (+)    Abdominal   Peds  Hematology negative hematology ROS (+)   Anesthesia Other Findings   Reproductive/Obstetrics                             Anesthesia Physical Anesthesia Plan  ASA: 2  Anesthesia Plan: General   Post-op Pain Management: Tylenol PO (pre-op)* and Toradol IV (intra-op)*   Induction: Intravenous  PONV Risk Score and Plan: 3  Airway Management Planned: LMA  Additional Equipment:   Intra-op Plan:   Post-operative Plan: Extubation in OR  Informed Consent: I have reviewed the patients History and Physical, chart, labs and discussed the procedure including the risks, benefits and alternatives for the proposed anesthesia with the patient or authorized representative who has indicated his/her understanding and acceptance.     Dental advisory given  Plan Discussed with: Anesthesiologist and CRNA  Anesthesia Plan Comments: (PAT note written 10/20/2022 by Myra Gianotti, PA-C.  )       Anesthesia Quick Evaluation
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
## 2022-10-22 NOTE — H&P (Signed)
Orthopaedic Trauma Service (OTS) Consult   Patient ID: Terri Drake MRN: [id number] DOB/AGE: November 05, 1960 62 y.o.    HPI: Terri Drake is an 62 y.o. female s/p closed right ankle fracture with syndesmotic disruption 12/2021 after being injured at work. This fracture was treated surgically due to its unstable nature. Pt has done very well but has continued pain related to her hardware.  We have exhausted all non-op interventions but pain persists. Pt presents today for [facility] R ankle.  Risks and benefits of surgery reviewed with the patient and she wishes to proceed with hardware removal   Past Medical History:  Diagnosis Date   Ankle dislocation, right, initial encounter 12/30/2021   Ankle syndesmosis disruption, right, initial encounter 12/30/2021   Arthritis    Closed displaced trimalleolar fracture of right ankle 12/30/2021   COVID 09/20/2022   Dysrhythmia    a-fib   Paroxysmal atrial fibrillation (HCC)    Pneumonia    as a child    Past Surgical History:  Procedure Laterality Date   ORIF ANKLE FRACTURE Right 12/29/2021   Procedure: OPEN REDUCTION INTERNAL FIXATION (ORIF) ANKLE FRACTURE;  Surgeon: Altamese [address] Makaelyn Aponte, MD;  Location: [facility];  Service: Orthopedics;  Laterality: Right;    Family History  Problem Relation Age of Onset   Heart disease Neg Hx        in either parent    Social History:  reports that she has never smoked. She has never used smokeless tobacco. She reports current alcohol use of about 5.0 - 10.0 standard drinks of alcohol per week. She reports that she does not use drugs.  Allergies:  Allergies  Allergen Reactions   Elemental Sulfur     REACTION: hives   Gluten Meal Diarrhea   Sulfa Antibiotics Rash    Medications: I have reviewed the patient's current medications. Current Outpatient Medications  Medication Instructions   acetaminophen (TYLENOL) 325-650 mg, Oral, Every 4 hours PRN   ascorbic acid (VITAMIN C) 500 mg, Oral, Daily    Biotin 5,000 mcg, Oral, Daily   flecainide (TAMBOCOR) 100 mg, Oral, 2 times daily   latanoprost (XALATAN) 0.005 % ophthalmic solution 1 drop, Both Eyes, Daily at bedtime   magnesium gluconate (MAGONATE) 250 mg, Oral, Daily at bedtime   metoprolol succinate (TOPROL-XL) 25 mg, Oral, Daily at bedtime   Vitamin D (Ergocalciferol) (DRISDOL) 50,000 Units, Oral, Every 7 days   Vitamin D3 4,000 Units, Oral, Daily   zinc sulfate 220 mg, Oral, Daily     No results found for this or any previous visit (from the past 48 hour(s)).  No results found.  Intake/Output    None      Review of Systems  Constitutional:  Negative for chills and fever.  Eyes:  Negative for blurred vision.  Respiratory:  Negative for cough and shortness of breath.   Cardiovascular:  Negative for chest pain and palpitations.  Gastrointestinal:  Negative for nausea and vomiting.  Musculoskeletal:        R ankle pain   Neurological:  Negative for dizziness.   Height '5\' 7"'$  (1.702 m), weight 86.2 kg. Physical Exam Constitutional:      General: She is not in acute distress.    Appearance: Normal appearance.  HENT:     Head: Normocephalic and atraumatic.     Mouth/Throat:     Mouth: Mucous membranes are moist.  Cardiovascular:     Rate and Rhythm: Normal rate.  Pulses: Normal pulses.  Pulmonary:     Effort: Pulmonary effort is normal.  Musculoskeletal:     Comments: Right lower extremity  All incisions R ankle well healed Tender over hardware medially and laterally  Distal motor and sensory functions intact Good ankle ROM noted, symmetric to contra-lateral side  No swelling of significance Skin wrinkles over planned incision sites + DP pulse No DCT Compartments are soft Knee is nontender   Skin:    General: Skin is warm and dry.     Capillary Refill: Capillary refill takes less than 2 seconds.  Neurological:     General: No focal deficit present.     Mental Status: She is alert and oriented to  person, place, and time.  Psychiatric:        Mood and Affect: Mood normal.        Behavior: Behavior normal.        Thought Content: Thought content normal.        Judgment: Judgment normal.    Assessment/Plan:  62 y/o female s/p ORIF R ankle fracture dislocation with syndesmotic disruption 12/2021 s/p ORIF with symptomatic hardware   -symptomatic hardware R ankle s/p ORIF R trimalleolar ankle fracture dislocation and syndesmotic disruption   OR for [facility] R ankle  WBAT post op   No motion restrictions post op  Outpt surgery   Risks and benefits of surgery reviewed with pt and she wishes to proceed  - Pain management:  Multimodal  Short course narcotics (<5 days)  - Dispo:  OR for ROH   Dc from pacu when stable  Follow up with ortho in 2 weeks for suture removal    Jari Pigg, PA-C [phone] (C) 10/22/2022, 8:13 PM  [facility] [place] [address] [phone] Jenetta [phone] (F)    After 5pm and on the weekends please log on to Amion, go to orthopaedics and the look under the [facility] Call for the provider(s) on call. You can also call our office at [phone] and then follow the prompts to be connected to the call team.

## 2022-10-23 ENCOUNTER — Ambulatory Visit (HOSPITAL_COMMUNITY): Payer: BC Managed Care – PPO

## 2022-10-23 ENCOUNTER — Ambulatory Visit (HOSPITAL_COMMUNITY)
Admission: RE | Admit: 2022-10-23 | Discharge: 2022-10-23 | Disposition: A | Discharge status: Home / Self Care | Payer: No Typology Code available for payment source | Source: Ambulatory Visit | Attending: Orthopedic Surgery | Admitting: Orthopedic Surgery

## 2022-10-23 ENCOUNTER — Other Ambulatory Visit: Payer: Self-pay

## 2022-10-23 ENCOUNTER — Ambulatory Visit (HOSPITAL_COMMUNITY): Payer: No Typology Code available for payment source | Admitting: Vascular Surgery

## 2022-10-23 ENCOUNTER — Encounter (HOSPITAL_COMMUNITY): Payer: Self-pay | Admitting: Orthopedic Surgery

## 2022-10-23 ENCOUNTER — Ambulatory Visit (HOSPITAL_BASED_OUTPATIENT_CLINIC_OR_DEPARTMENT_OTHER): Payer: No Typology Code available for payment source | Admitting: Vascular Surgery

## 2022-10-23 ENCOUNTER — Other Ambulatory Visit (HOSPITAL_COMMUNITY): Payer: Self-pay

## 2022-10-23 ENCOUNTER — Encounter (HOSPITAL_COMMUNITY)
Admission: RE | Disposition: A | Discharge status: Home / Self Care | Payer: Self-pay | Source: Ambulatory Visit | Attending: Orthopedic Surgery

## 2022-10-23 DIAGNOSIS — T8484XA Pain due to internal orthopedic prosthetic devices, implants and grafts, initial encounter: Secondary | ICD-10-CM | POA: Diagnosis not present

## 2022-10-23 DIAGNOSIS — Z79899 Other long term (current) drug therapy: Secondary | ICD-10-CM | POA: Insufficient documentation

## 2022-10-23 DIAGNOSIS — I480 Paroxysmal atrial fibrillation: Secondary | ICD-10-CM | POA: Diagnosis not present

## 2022-10-23 DIAGNOSIS — Z4589 Encounter for adjustment and management of other implanted devices: Secondary | ICD-10-CM | POA: Diagnosis not present

## 2022-10-23 HISTORY — PX: HARDWARE REMOVAL: SHX979

## 2022-10-23 HISTORY — DX: Pneumonia, unspecified organism: J18.9

## 2022-10-23 HISTORY — DX: Myoneural disorder, unspecified: G70.9

## 2022-10-23 HISTORY — DX: Unspecified osteoarthritis, unspecified site: M19.90

## 2022-10-23 HISTORY — DX: Dyspnea, unspecified: R06.00

## 2022-10-23 LAB — BASIC METABOLIC PANEL
Anion gap: 6 (ref 5–15)
BUN: 11 mg/dL (ref 8–23)
CO2: 22 mmol/L (ref 22–32)
Calcium: 8.9 mg/dL (ref 8.9–10.3)
Chloride: 106 mmol/L (ref 98–111)
Creatinine, Ser: 0.97 mg/dL (ref 0.44–1.00)
GFR, Estimated: >60 mL/min (ref >60)
Glucose, Bld: 109 mg/dL — ABNORMAL HIGH (ref 70–99)
Potassium: 4.2 mmol/L (ref 3.5–5.1)
Sodium: 134 mmol/L — ABNORMAL LOW (ref 135–145)

## 2022-10-23 LAB — CBC
HCT: 38.3 % (ref 36.0–46.0)
Hemoglobin: 13.8 g/dL (ref 12.0–15.0)
MCH: 35.7 pg — ABNORMAL HIGH (ref 26.0–34.0)
MCHC: 36 g/dL (ref 30.0–36.0)
MCV: 99 fL (ref 80.0–100.0)
Platelets: 219 10*3/uL (ref 150–400)
RBC: 3.87 MIL/uL (ref 3.87–5.11)
RDW: 13.3 % (ref 11.5–15.5)
WBC: 5.2 10*3/uL (ref 4.0–10.5)
nRBC: 0 % (ref 0.0–0.2)

## 2022-10-23 LAB — SURGICAL PCR SCREEN
MRSA, PCR: NEGATIVE
Staphylococcus aureus: NEGATIVE

## 2022-10-23 SURGERY — REMOVAL, HARDWARE
Anesthesia: General | Site: Ankle | Laterality: Right

## 2022-10-23 MED ORDER — OXYCODONE-ACETAMINOPHEN 5-325 MG PO TABS
1.0000 | ORAL_TABLET | Freq: Three times a day (TID) | ORAL | 0 refills | Status: DC | PRN
  Filled 2022-10-23: qty 30, 5d supply, fill #0

## 2022-10-23 MED ORDER — KETOROLAC TROMETHAMINE 30 MG/ML IJ SOLN
INTRAMUSCULAR | Status: DC | PRN
  Administered 2022-10-23: 30 mg via INTRAVENOUS

## 2022-10-23 MED ORDER — CEFAZOLIN SODIUM-DEXTROSE 2-4 GM/100ML-% IV SOLN
2.0000 g | INTRAVENOUS | Status: AC
  Administered 2022-10-23: 2 g via INTRAVENOUS

## 2022-10-23 MED ORDER — LACTATED RINGERS IV SOLN: INTRAVENOUS | Status: DC

## 2022-10-23 MED ORDER — ONDANSETRON 4 MG PO TBDP
4.0000 mg | ORAL_TABLET | Freq: Three times a day (TID) | ORAL | 0 refills | Status: DC | PRN
  Filled 2022-10-23: qty 18, 6d supply, fill #0

## 2022-10-23 MED ORDER — FENTANYL CITRATE (PF) 250 MCG/5ML IJ SOLN
INTRAMUSCULAR | Status: AC
  Filled 2022-10-23: qty 5

## 2022-10-23 MED ORDER — 0.9 % SODIUM CHLORIDE (POUR BTL) OPTIME
TOPICAL | Status: DC | PRN
  Administered 2022-10-23: 1000 mL

## 2022-10-23 MED ORDER — DEXAMETHASONE SODIUM PHOSPHATE 10 MG/ML IJ SOLN
INTRAMUSCULAR | Status: DC | PRN
  Administered 2022-10-23: 10 mg via INTRAVENOUS

## 2022-10-23 MED ORDER — AMISULPRIDE (ANTIEMETIC) 5 MG/2ML IV SOLN: 10.0000 mg | Freq: Once | INTRAVENOUS | Status: DC | PRN

## 2022-10-23 MED ORDER — KETOROLAC TROMETHAMINE 10 MG PO TABS
10.0000 mg | ORAL_TABLET | Freq: Four times a day (QID) | ORAL | 0 refills | Status: DC | PRN
  Filled 2022-10-23: qty 20, 5d supply, fill #0

## 2022-10-23 MED ORDER — PROPOFOL 10 MG/ML IV BOLUS
INTRAVENOUS | Status: AC
  Filled 2022-10-23: qty 20

## 2022-10-23 MED ORDER — ACETAMINOPHEN 500 MG PO TABS: 1000.0000 mg | ORAL_TABLET | Freq: Once | ORAL | Status: AC

## 2022-10-23 MED ORDER — ACETAMINOPHEN 500 MG PO TABS
ORAL_TABLET | ORAL | Status: AC
  Administered 2022-10-23: 1000 mg via ORAL
  Filled 2022-10-23: qty 2

## 2022-10-23 MED ORDER — CEFAZOLIN SODIUM-DEXTROSE 2-4 GM/100ML-% IV SOLN
INTRAVENOUS | Status: AC
  Filled 2022-10-23: qty 100

## 2022-10-23 MED ORDER — HYDROMORPHONE HCL 1 MG/ML IJ SOLN
INTRAMUSCULAR | Status: AC
  Filled 2022-10-23: qty 0.5

## 2022-10-23 MED ORDER — HYDROMORPHONE HCL 1 MG/ML IJ SOLN
INTRAMUSCULAR | Status: AC
  Filled 2022-10-23: qty 1

## 2022-10-23 MED ORDER — ONDANSETRON HCL 4 MG/2ML IJ SOLN
INTRAMUSCULAR | Status: DC | PRN
  Administered 2022-10-23: 4 mg via INTRAVENOUS

## 2022-10-23 MED ORDER — CHLORHEXIDINE GLUCONATE 0.12 % MT SOLN: 15.0000 mL | Freq: Once | OROMUCOSAL | Status: AC

## 2022-10-23 MED ORDER — HYDROMORPHONE HCL 1 MG/ML IJ SOLN
0.2500 mg | INTRAMUSCULAR | Status: DC | PRN
  Administered 2022-10-23: 0.5 mg via INTRAVENOUS

## 2022-10-23 MED ORDER — CHLORHEXIDINE GLUCONATE 0.12 % MT SOLN
OROMUCOSAL | Status: AC
  Administered 2022-10-23: 15 mL via OROMUCOSAL
  Filled 2022-10-23: qty 15

## 2022-10-23 MED ORDER — FENTANYL CITRATE (PF) 100 MCG/2ML IJ SOLN
INTRAMUSCULAR | Status: AC
  Filled 2022-10-23: qty 2

## 2022-10-23 MED ORDER — MIDAZOLAM HCL 2 MG/2ML IJ SOLN
INTRAMUSCULAR | Status: AC
  Filled 2022-10-23: qty 2

## 2022-10-23 MED ORDER — MIDAZOLAM HCL 2 MG/2ML IJ SOLN
INTRAMUSCULAR | Status: DC | PRN
  Administered 2022-10-23: 2 mg via INTRAVENOUS

## 2022-10-23 MED ORDER — HYDROMORPHONE HCL 1 MG/ML IJ SOLN
INTRAMUSCULAR | Status: DC | PRN
  Administered 2022-10-23: .5 mg via INTRAVENOUS

## 2022-10-23 MED ORDER — FENTANYL CITRATE (PF) 250 MCG/5ML IJ SOLN
INTRAMUSCULAR | Status: DC | PRN
  Administered 2022-10-23: 50 ug via INTRAVENOUS
  Administered 2022-10-23 (×2): 25 ug via INTRAVENOUS

## 2022-10-23 MED ORDER — LIDOCAINE 2% (20 MG/ML) 5 ML SYRINGE
INTRAMUSCULAR | Status: DC | PRN
  Administered 2022-10-23: 100 mg via INTRAVENOUS

## 2022-10-23 MED ORDER — EPHEDRINE SULFATE-NACL 50-0.9 MG/10ML-% IV SOSY
PREFILLED_SYRINGE | INTRAVENOUS | Status: DC | PRN
  Administered 2022-10-23: 10 mg via INTRAVENOUS
  Administered 2022-10-23 (×2): 5 mg via INTRAVENOUS

## 2022-10-23 MED ORDER — METHOCARBAMOL 500 MG PO TABS
500.0000 mg | ORAL_TABLET | Freq: Three times a day (TID) | ORAL | 0 refills | Status: DC | PRN
  Filled 2022-10-23: qty 40, 7d supply, fill #0

## 2022-10-23 MED ORDER — PROPOFOL 10 MG/ML IV BOLUS
INTRAVENOUS | Status: DC | PRN
  Administered 2022-10-23: 160 mg via INTRAVENOUS

## 2022-10-23 MED ORDER — PROMETHAZINE HCL 25 MG/ML IJ SOLN: 6.2500 mg | INTRAMUSCULAR | Status: DC | PRN

## 2022-10-23 MED ORDER — ORAL CARE MOUTH RINSE: 15.0000 mL | Freq: Once | OROMUCOSAL | Status: AC

## 2022-10-23 MED ORDER — FENTANYL CITRATE (PF) 100 MCG/2ML IJ SOLN
25.0000 ug | INTRAMUSCULAR | Status: DC | PRN
  Administered 2022-10-23 (×2): 50 ug via INTRAVENOUS
  Administered 2022-10-23: 25 ug via INTRAVENOUS

## 2022-10-23 SURGICAL SUPPLY — 63 items
BAG COUNTER SPONGE SURGICOUNT (BAG) ×2 IMPLANT
BAG SPNG CNTER NS LX DISP (BAG) ×1
BANDAGE ESMARK 6X9 LF (GAUZE/BANDAGES/DRESSINGS) ×2 IMPLANT
BNDG CMPR 5X6 CHSV STRCH STRL (GAUZE/BANDAGES/DRESSINGS)
BNDG CMPR 9X6 STRL LF SNTH (GAUZE/BANDAGES/DRESSINGS)
BNDG COHESIVE 6X5 TAN ST LF (GAUZE/BANDAGES/DRESSINGS) ×2 IMPLANT
BNDG ELASTIC 4X5.8 VLCR STR LF (GAUZE/BANDAGES/DRESSINGS) ×2 IMPLANT
BNDG ELASTIC 6X5.8 VLCR STR LF (GAUZE/BANDAGES/DRESSINGS) ×2 IMPLANT
BNDG ESMARK 6X9 LF (GAUZE/BANDAGES/DRESSINGS)
BNDG GAUZE DERMACEA FLUFF 4 (GAUZE/BANDAGES/DRESSINGS) ×4 IMPLANT
BNDG GZE DERMACEA 4 6PLY (GAUZE/BANDAGES/DRESSINGS) ×1
BRUSH SCRUB EZ PLAIN DRY (MISCELLANEOUS) ×4 IMPLANT
COVER SURGICAL LIGHT HANDLE (MISCELLANEOUS) ×4 IMPLANT
CUFF TOURN SGL QUICK 18X4 (TOURNIQUET CUFF) IMPLANT
CUFF TOURN SGL QUICK 24 (TOURNIQUET CUFF)
CUFF TOURN SGL QUICK 34 (TOURNIQUET CUFF)
CUFF TRNQT CYL 24X4X16.5-23 (TOURNIQUET CUFF) IMPLANT
CUFF TRNQT CYL 34X4.125X (TOURNIQUET CUFF) IMPLANT
DRAPE C-ARM 42X72 X-RAY (DRAPES) IMPLANT
DRAPE C-ARMOR (DRAPES) ×2 IMPLANT
DRAPE U-SHAPE 47X51 STRL (DRAPES) ×2 IMPLANT
DRSG ADAPTIC 3X8 NADH LF (GAUZE/BANDAGES/DRESSINGS) ×2 IMPLANT
DRSG MEPITEL 4X7.2 (GAUZE/BANDAGES/DRESSINGS) IMPLANT
ELECT REM PT RETURN 9FT ADLT (ELECTROSURGICAL) ×1
ELECTRODE REM PT RTRN 9FT ADLT (ELECTROSURGICAL) ×2 IMPLANT
GAUZE SPONGE 4X4 12PLY STRL (GAUZE/BANDAGES/DRESSINGS) ×2 IMPLANT
GLOVE BIO SURGEON STRL SZ7.5 (GLOVE) ×2 IMPLANT
GLOVE BIO SURGEON STRL SZ8 (GLOVE) ×2 IMPLANT
GLOVE BIOGEL PI IND STRL 7.5 (GLOVE) ×2 IMPLANT
GLOVE BIOGEL PI IND STRL 8 (GLOVE) ×2 IMPLANT
GLOVE SURG ORTHO LTX SZ7.5 (GLOVE) ×4 IMPLANT
GOWN STRL REUS W/ TWL LRG LVL3 (GOWN DISPOSABLE) ×4 IMPLANT
GOWN STRL REUS W/ TWL XL LVL3 (GOWN DISPOSABLE) ×2 IMPLANT
GOWN STRL REUS W/TWL LRG LVL3 (GOWN DISPOSABLE) ×2
GOWN STRL REUS W/TWL XL LVL3 (GOWN DISPOSABLE) ×1
KIT BASIN OR (CUSTOM PROCEDURE TRAY) ×2 IMPLANT
KIT TURNOVER KIT B (KITS) ×2 IMPLANT
MANIFOLD NEPTUNE II (INSTRUMENTS) ×2 IMPLANT
NDL 22X1.5 STRL (OR ONLY) (MISCELLANEOUS) IMPLANT
NEEDLE 22X1.5 STRL (OR ONLY) (MISCELLANEOUS) IMPLANT
NS IRRIG 1000ML POUR BTL (IV SOLUTION) ×2 IMPLANT
PACK ORTHO EXTREMITY (CUSTOM PROCEDURE TRAY) ×2 IMPLANT
PAD ARMBOARD 7.5X6 YLW CONV (MISCELLANEOUS) ×4 IMPLANT
PADDING CAST COTTON 6X4 STRL (CAST SUPPLIES) ×6 IMPLANT
SPONGE T-LAP 18X18 ~~LOC~~+RFID (SPONGE) ×2 IMPLANT
STAPLER VISISTAT 35W (STAPLE) IMPLANT
STOCKINETTE IMPERVIOUS LG (DRAPES) ×2 IMPLANT
STRIP CLOSURE SKIN 1/2X4 (GAUZE/BANDAGES/DRESSINGS) IMPLANT
SUCTION FRAZIER HANDLE 10FR (MISCELLANEOUS) ×1
SUCTION TUBE FRAZIER 10FR DISP (MISCELLANEOUS) IMPLANT
SUT ETHILON 2 0 FS 18 (SUTURE) IMPLANT
SUT PDS AB 2-0 CT1 27 (SUTURE) IMPLANT
SUT VIC AB 0 CT1 27 (SUTURE)
SUT VIC AB 0 CT1 27XBRD ANBCTR (SUTURE) IMPLANT
SUT VIC AB 2-0 CT1 27 (SUTURE)
SUT VIC AB 2-0 CT1 TAPERPNT 27 (SUTURE) IMPLANT
SYR CONTROL 10ML LL (SYRINGE) IMPLANT
TOWEL GREEN STERILE (TOWEL DISPOSABLE) ×4 IMPLANT
TOWEL GREEN STERILE FF (TOWEL DISPOSABLE) ×4 IMPLANT
TUBE CONNECTING 12X1/4 (SUCTIONS) ×2 IMPLANT
UNDERPAD 30X36 HEAVY ABSORB (UNDERPADS AND DIAPERS) ×2 IMPLANT
WATER STERILE IRR 1000ML POUR (IV SOLUTION) ×4 IMPLANT
YANKAUER SUCT BULB TIP NO VENT (SUCTIONS) ×2 IMPLANT

## 2022-10-23 NOTE — Discharge Instructions (Addendum)
Orthopaedic Trauma Service Discharge Instructions   General Discharge Instructions  WEIGHT BEARING STATUS: Weightbearing as tolerated right leg   RANGE OF MOTION/ACTIVITY: unrestricted motion right ankle. Increase activity slowly. You may need to use crutches/walker for a few days   Wound Care: daily dressing changes starting on 10/25/2022  Discharge Wound Care Instructions  Do NOT apply any ointments, solutions or lotions to pin sites or surgical wounds.  These prevent needed drainage and even though solutions like hydrogen peroxide kill bacteria, they also damage cells lining the pin sites that help fight infection.  Applying lotions or ointments can keep the wounds moist and can cause them to breakdown and open up as well. This can increase the risk for infection. When in doubt call the office.  Surgical incisions should be dressed daily.  If any drainage is noted, use one layer of adaptic or Mepitel, then gauze, Kerlix, and an ace wrap.  [email or web address]  [email or web address]  These dressing supplies should be available at local medical supply stores ([employer],  medical, etc). They are not usually carried at places like [facility], [facility], [employer], etc  Once the incision is completely dry and without drainage, it may be left open to air out.  Showering may begin 36-48 hours later.  Cleaning gently with soap and water.   Diet: as you were eating previously.  Can use over the counter stool softeners and bowel preparations, such as Miralax, to help with bowel movements.  Narcotics can be constipating.  Be sure to drink plenty of fluids  PAIN MEDICATION USE AND EXPECTATIONS  You have likely been given narcotic medications to help control  your pain.  After a traumatic event that results in an fracture (broken bone) with or without surgery, it is ok to use narcotic pain medications to help control one's pain.  We understand that everyone responds to pain differently and each individual patient will be evaluated on a regular basis for the continued need for narcotic medications. Ideally, narcotic medication use should last no more than 6-8 weeks (coinciding with fracture healing).   As a patient it is your responsibility as well to monitor narcotic medication use and report the amount and frequency you use these medications when you come to your office visit.   We would also advise that if you are using narcotic medications, you should take a dose prior to therapy to maximize you participation.  IF YOU ARE ON NARCOTIC MEDICATIONS IT IS NOT PERMISSIBLE TO OPERATE A MOTOR VEHICLE (MOTORCYCLE/CAR/TRUCK/MOPED) OR HEAVY MACHINERY DO NOT MIX NARCOTICS WITH OTHER CNS (CENTRAL NERVOUS SYSTEM) DEPRESSANTS SUCH AS ALCOHOL   POST-OPERATIVE OPIOID TAPER INSTRUCTIONS: It is important to wean off of your opioid medication as soon as possible. If you do not need pain medication after your surgery it is ok to stop day one. Opioids include: Codeine, Hydrocodone(Norco, Vicodin), Oxycodone(Percocet, oxycontin) and hydromorphone amongst others.  Long term and even short term use of opiods can cause: Increased pain response Dependence Constipation Depression Respiratory depression And more.  Withdrawal symptoms can include Flu like symptoms Nausea, vomiting And more Techniques to manage these symptoms Hydrate well Eat regular healthy meals Stay active Use relaxation techniques(deep breathing, meditating, yoga) Do Not substitute Alcohol to help with tapering If you have been on opioids for less than two weeks and do not have pain than it is ok to stop all together.  Plan to wean off of opioids This plan should start within one week post op of  your  fracture surgery  Maintain the same interval or time between taking each dose and first decrease the dose.  Cut the total daily intake of opioids by one tablet each day Next start to increase the time between doses. The last dose that should be eliminated is the evening dose.    STOP SMOKING OR USING NICOTINE PRODUCTS!!!!  As discussed nicotine severely impairs your body's ability to heal surgical and traumatic wounds but also impairs bone healing.  Wounds and bone heal by forming microscopic blood vessels (angiogenesis) and nicotine is a vasoconstrictor (essentially, shrinks blood vessels).  Therefore, if vasoconstriction occurs to these microscopic blood vessels they essentially disappear and are unable to deliver necessary nutrients to the healing tissue.  This is one modifiable factor that you can do to dramatically increase your chances of healing your injury.    (This means no smoking, no nicotine gum, patches, etc)  DO NOT USE NONSTEROIDAL ANTI-INFLAMMATORY DRUGS (NSAID'S)  Using products such as Advil (ibuprofen), Aleve (naproxen), Motrin (ibuprofen) for additional pain control during fracture healing can delay and/or prevent the healing response.  If you would like to take over the counter (OTC) medication, Tylenol (acetaminophen) is ok.  However, some narcotic medications that are given for pain control contain acetaminophen as well. Therefore, you should not exceed more than 4000 mg of tylenol in a day if you do not have liver disease.  Also note that there are may OTC medicines, such as cold medicines and allergy medicines that my contain tylenol as well.  If you have any questions about medications and/or interactions please ask your doctor/PA or your pharmacist.      ICE AND ELEVATE INJURED/OPERATIVE EXTREMITY  Using ice and elevating the injured extremity above your heart can help with swelling and pain control.  Icing in a pulsatile fashion, such as 20 minutes on and 20 minutes  off, can be followed.    Do not place ice directly on skin. Make sure there is a barrier between to skin and the ice pack.    Using frozen items such as frozen peas works well as the conform nicely to the are that needs to be iced.  USE AN ACE WRAP OR TED HOSE FOR SWELLING CONTROL  In addition to icing and elevation, Ace wraps or TED hose are used to help limit and resolve swelling.  It is recommended to use Ace wraps or TED hose until you are informed to stop.    When using Ace Wraps start the wrapping distally (farthest away from the body) and wrap proximally (closer to the body)   Example: If you had surgery on your leg or thing and you do not have a splint on, start the ace wrap at the toes and work your way up to the thigh        If you had surgery on your upper extremity and do not have a splint on, start the ace wrap at your fingers and work your way up to the upper arm  IF YOU ARE IN A SPLINT OR CAST DO NOT [place]   If your splint gets wet for any reason please contact the office immediately. You may shower in your splint or cast as long as you keep it dry.  This can be done by wrapping in a cast cover or garbage back (or similar)  Do Not stick any thing down your splint or cast such as pencils, money, or hangers to try and scratch yourself  with.  If you feel itchy take benadryl as prescribed on the bottle for itching  IF YOU ARE IN A CAM BOOT (BLACK BOOT)  You may remove boot periodically. Perform daily dressing changes as noted below.  Wash the liner of the boot regularly and wear a sock when wearing the boot. It is recommended that you sleep in the boot until told otherwise    Call office for the following: Temperature greater than 101F Persistent nausea and vomiting Severe uncontrolled pain Redness, tenderness, or signs of infection (pain, swelling, redness, odor or green/yellow discharge around the site) Difficulty breathing, headache or visual  disturbances Hives Persistent dizziness or light-headedness Extreme fatigue Any other questions or concerns you may have after discharge  In an emergency, call 911 or go to an Emergency Department at a nearby hospital  HELPFUL INFORMATION  If you had a block, it will wear off between 8-24 hrs postop typically.  This is period when your pain may go from nearly zero to the pain you would have had postop without the block.  This is an abrupt transition but nothing dangerous is happening.  You may take an extra dose of narcotic when this happens.  You should wean off your narcotic medicines as soon as you are able.  Most patients will be off or using minimal narcotics before their first postop appointment.   We suggest you use the pain medication the first night prior to going to bed, in order to ease any pain when the anesthesia wears off. You should avoid taking pain medications on an empty stomach as it will make you nauseous.  Do not drink alcoholic beverages or take illicit drugs when taking pain medications.  In most states it is against the law to drive while you are in a splint or sling.  And certainly against the law to drive while taking narcotics.  You may return to work/school in the next couple of days when you feel up to it.   Pain medication may make you constipated.  Below are a few solutions to try in this order: Decrease the amount of pain medication if you aren't having pain. Drink lots of decaffeinated fluids. Drink prune juice and/or each dried prunes  If the first 3 don't work start with additional solutions Take Colace - an over-the-counter stool softener Take Senokot - an over-the-counter laxative Take Miralax - a stronger over-the-counter laxative     CALL THE OFFICE WITH ANY QUESTIONS OR CONCERNS: [phone]   VISIT OUR WEBSITE FOR ADDITIONAL INFORMATION: [email or web address]

## 2022-10-23 NOTE — Anesthesia Postprocedure Evaluation (Signed)
Anesthesia Post Note  Patient: Terri Drake  Procedure(s) Performed: HARDWARE REMOVAL (Right: Ankle)     Patient location during evaluation: PACU Anesthesia Type: General Level of consciousness: sedated Pain management: pain level controlled Vital Signs Assessment: post-procedure vital signs reviewed and stable Respiratory status: spontaneous breathing and respiratory function stable Cardiovascular status: stable Postop Assessment: no apparent nausea or vomiting Anesthetic complications: no  No notable events documented.  Last Vitals:  Vitals:   10/23/22 1100 10/23/22 1115  BP: (!) 178/82 (!) 147/59  Pulse: (!) 55 (!) 50  Resp: 12 13  Temp:  36.4 C  SpO2: 95% 95%    Last Pain:  Vitals:   10/23/22 1115  TempSrc:   PainSc: 3                  Pa Tennant DANIEL

## 2022-10-23 NOTE — Transfer of Care (Signed)
Immediate Anesthesia Transfer of Care Note  Patient: Terri Drake  Procedure(s) Performed: HARDWARE REMOVAL (Right: Ankle)  Patient Location: PACU  Anesthesia Type:General  Level of Consciousness: awake, alert , and oriented  Airway & Oxygen Therapy: Patient Spontanous Breathing  Post-op Assessment: Report given to RN and Post -op Vital signs reviewed and stable  Post vital signs: Reviewed and stable  Last Vitals:  Vitals Value Taken Time  BP 172/75 10/23/22 1011  Temp 36.2 C 10/23/22 1011  Pulse 57 10/23/22 1014  Resp 12 10/23/22 1014  SpO2 92 % 10/23/22 1014  Vitals shown include unvalidated device data.  Last Pain:  Vitals:   10/23/22 0619  TempSrc:   PainSc: 3          Complications: No notable events documented.

## 2022-10-23 NOTE — Op Note (Signed)
10/23/2022  11:01 AM  PATIENT:  Terri Drake  17-Apr-1961 female   MEDICAL RECORD NUMBER: [id number]  PRE-OPERATIVE DIAGNOSIS:  SYMPTOMATIC HARDWARE RIGHT ANKLE  POST-OPERATIVE DIAGNOSIS:  SYMPTOMATIC HARDWARE RIGHT ANKLE  PROCEDURE:   REMOVAL OF DEEP IMPLANT RIGHT TIBIA AND FIBULA MANUAL APPLICATION OF STRESS ANKLE SYNDESMOSIS UNDER FLUOROSCOPY  SURGEON:  Astrid Divine. Marcelino Scot, M.D.  ASSISTANT:  PA Student.  ANESTHESIA:  General.  COMPLICATIONS:  None.  TOURNIQUET: None.  SPECIMENS: None.  ESTIMATED BLOOD LOSS:  Scant.  DISPOSITION:  To PACU.  CONDITION:  Stable.  DELAY START OF DVT PROPHYLAXIS BECAUSE OF BLEEDING RISK: NO    BRIEF SUMMARY OF INDICATION FOR PROCEDURE:  Patient is a pleasant 62 y.o. who underwent plate fixation of a fracture with subsequent healing. Despite conservative measures, hardware related symptoms have persisted. Therefore, I discussed with the patient the risks and benefits of surgical removal including infection, nerve or vessel injury, failure to alleviate symptoms, occult nonunion, re-fracture, DVT, PE, and multiple others. She did wish to proceed.   BRIEF SUMMARY OF PROCEDURE:  The patient was taken to the operating room after administration of 2 g of Ancef.  General anesthesia was induced. The right lower extremity was prepped and draped in usual sterile fashion.  No tourniquet was used during the procedure.  C-arm was brought in to confirm position of the hardware.  I remade the old distal incision and dissected sharply down to the plate, elevating the soft tissues. I identified and removed all screws and the button washer laterally.  With x-ray confirmation, I then remade portions of the medial incision.  The screws and anchor device were extracted atraumatically. Final x-rays confirmed removal of all hardware and a healed fracture.   Because of the patient's ankle pain and prior syndesmosis injury suggesting possible instability, a stress  evaluation was performed, consisting of external rotation of the ankle while holding it in the mortise view. Under live fluoro, I did not identify  any widening of the medial clear space nor widening of the syndesmotic interval. Consequently it was deemed stable.  The wounds were irrigated thoroughly and closed in standard fashion with vicryl and nylon. A sterile gently compressive dressing was applied.  The patient was taken to the PACU in stable condition.  A PA student assisted me throughout.   PROGNOSIS: Patient will be weightbearing as tolerated with aggressive active and passive motion of the knee and ankle. Bleeding would be anticipated. She may change or remove dressing in 48 hours and shower. Patient will follow up in 10 days for removal of sutures.       Astrid Divine. Marcelino Scot, M.D.

## 2022-10-23 NOTE — Anesthesia Procedure Notes (Signed)
Procedure Name: LMA Insertion Date/Time: 10/23/2022 9:00 AM  Performed by: Clearnce Sorrel, CRNAPre-anesthesia Checklist: Patient identified, Emergency Drugs available, Suction available and Patient being monitored Patient Re-evaluated:Patient Re-evaluated prior to induction Oxygen Delivery Method: Circle System Utilized Preoxygenation: Pre-oxygenation with 100% oxygen Induction Type: IV induction Ventilation: Mask ventilation without difficulty LMA: LMA inserted LMA Size: 4.0 Number of attempts: 1 Airway Equipment and Method: Bite block Placement Confirmation: positive ETCO2 Tube secured with: Tape Dental Injury: Teeth and Oropharynx as per pre-operative assessment

## 2022-10-24 ENCOUNTER — Encounter (HOSPITAL_COMMUNITY): Payer: Self-pay | Admitting: Orthopedic Surgery

## 2022-10-26 ENCOUNTER — Encounter
Payer: No Typology Code available for payment source | Attending: Physical Medicine and Rehabilitation | Admitting: Physical Medicine and Rehabilitation

## 2022-10-26 VITALS — BP 135/83 | HR 55 | Ht 67.0 in | Wt 194.0 lb

## 2022-10-26 DIAGNOSIS — M25571 Pain in right ankle and joints of right foot: Secondary | ICD-10-CM

## 2022-10-26 DIAGNOSIS — G629 Polyneuropathy, unspecified: Secondary | ICD-10-CM

## 2022-10-26 DIAGNOSIS — R0602 Shortness of breath: Secondary | ICD-10-CM | POA: Diagnosis not present

## 2022-10-26 DIAGNOSIS — M791 Myalgia, unspecified site: Secondary | ICD-10-CM | POA: Diagnosis not present

## 2022-10-26 MED ORDER — LIDOCAINE HCL 1 % IJ SOLN
5.0000 mL | Freq: Once | INTRAMUSCULAR | Status: AC
  Administered 2022-10-26: 5 mL via INTRADERMAL

## 2022-10-26 NOTE — Progress Notes (Signed)
Subjective:    Patient ID: Terri Drake, female    DOB: 04/13/1961, 62 y.o.   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 Marcelino Scot removed hardware -she was able to do her [place] trip in December!  4) Shortness of breath: -still experiencing   Pain Inventory Average Pain 4 Pain Right Now 6 My pain is  .  In the last 24 hours, has pain interfered with the following? General activity 3 Relation with others 0 Enjoyment of life 5 What TIME of day is your pain at its worst? morning , daytime, and evening Sleep (in general) Fair  Pain is worse with: standing and some activites Pain improves with: therapy/exercise Relief from Meds: 0  Family History  Problem Relation Age of Onset   Heart disease Neg Hx        in either parent   Social History   Socioeconomic History   Marital status: Married    Spouse name: Not on file   Number of children: Not on file   Years of education: Not on file   Highest education level: Not on file  Occupational History   Not on file  Tobacco Use   Smoking status: Never   Smokeless tobacco: Never   Tobacco comments:    Never smoke 11/04/21  Vaping Use   Vaping Use: Never used  Substance and Sexual Activity   Alcohol use: Yes    Alcohol/week: 5.0 - 10.0 standard drinks of alcohol    Types: 5 - 10 Glasses of wine per week    Comment: 1-2 glasses nightly as of 10/20/22   Drug use: Never   Sexual activity: Not on file  Other Topics Concern   Not on file  Social History Narrative   Not on file   Social Determinants of Health   Financial Resource Strain: Not on file  Food Insecurity: Not  on file  Transportation Needs: Not on file  Physical Activity: Not on file  Stress: Not on file  Social Connections: Not on file   Past Surgical History:  Procedure Laterality Date   HARDWARE REMOVAL Right 10/23/2022   Procedure: HARDWARE REMOVAL;  Surgeon: Altamese [place], MD;  Location: [place];  Service: Orthopedics;  Laterality: Right;   ORIF ANKLE FRACTURE Right 12/29/2021   Procedure: OPEN REDUCTION INTERNAL FIXATION (ORIF) ANKLE FRACTURE;  Surgeon: Altamese [place], MD;  Location: [place];  Service: Orthopedics;  Laterality: Right;   Past Surgical History:  Procedure Laterality Date   HARDWARE REMOVAL Right 10/23/2022   Procedure: HARDWARE REMOVAL;  Surgeon: Altamese [place], MD;  Location: [facility];  Service: Orthopedics;  Laterality: Right;   ORIF ANKLE FRACTURE Right 12/29/2021   Procedure: OPEN REDUCTION INTERNAL FIXATION (ORIF) ANKLE FRACTURE;  Surgeon: Altamese , MD;  Location: [place] [facility];  Service: Orthopedics;  Laterality: Right;   Past Medical History:  Diagnosis Date   Ankle dislocation, right, initial encounter 12/30/2021   Ankle syndesmosis disruption, right, initial encounter 12/30/2021   Arthritis    Closed displaced trimalleolar fracture of right ankle 12/30/2021   COVID 09/20/2022   Dyspnea    Dysrhythmia    a-fib  Neuromuscular disorder ([place])    Paroxysmal atrial fibrillation (HCC)    Pneumonia    as a child   BP 135/83   Pulse (!) 55   Ht '5\' 7"'$  (1.702 m)   Wt 194 lb (88 kg)   SpO2 96%   BMI 30.38 kg/m   Opioid Risk Score:   Fall Risk Score:  `1  Depression screen [facility] 2/9     10/26/2022    9:32 AM 10/06/2022   11:26 AM 02/14/2022   11:00 AM  Depression screen PHQ 2/9  Decreased Interest 0 0 1  Down, Depressed, Hopeless 0 0 1  PHQ - 2 Score 0 0 2  Altered sleeping   3  Tired, decreased energy   2  Change in appetite   0  Feeling bad or failure about yourself    0  Trouble concentrating   1  Moving slowly or fidgety/restless   0  Suicidal thoughts   0   PHQ-9 Score   8  Difficult doing work/chores   Somewhat difficult      Review of Systems  Musculoskeletal:  Positive for back pain, gait problem and neck pain.  All other systems reviewed and are negative.      Objective:   Physical Exam  Gen: no distress, normal appearing, weight 194 lbs, BMI 30.38, 135/83 HEENT: oral mucosa pink and moist, NCAT Cardio: Reg rate Chest: normal effort, normal rate of breathing Abd: soft, non-distended Ext: no edema Psych: pleasant, normal affect Skin: intact Neuro: Right foot range of motion is restricted, muscle points tender Full range of motion in upper extremities       Assessment & Plan:   1) Numbness and tingling in both hands: -discussed that this is likely C8-T1 nerve root -MRI report reviewed.  -discussed that EMG/NCS scheduled  2) Right ankle pain: -Discussed Qutenza as an option for neuropathic pain control. Discussed that this is a capsaicin patch, stronger than capsaicin cream. Discussed that it is currently approved for diabetic peripheral neuropathy and post-herpetic neuralgia, but that it has also shown benefit in treating other forms of neuropathy. Provided patient with link to site to learn more about the patch: [employer]. Discussed that the patch would be placed in office and benefits usually last 3 months. Discussed that unintended exposure to capsaicin can cause severe irritation of eyes, mucous membranes, respiratory tract, and skin, but that Qutenza is a local treatment and does not have the systemic side effects of other nerve medications. Discussed that there may be pain, itching, erythema, and decreased sensory function associated with the application of Qutenza. Side effects usually subside within 1 week. A cold pack of analgesic medications can help with these side effects. Blood pressure can also be increased due to pain associated with administration of the patch.   Discussed extracorporeal shockwave  therapy as a modality for treatment. Discussed that the device looks and feels like a massage gun and I would move it over the area of pain for about 10 minutes. The device releases sound waves to the area of pain and helps to improve blood flow and circulation to improve the healing process. Discuss that this initially induces inflammation and can sometimes cause short-term increase in pain. Discussed that we typically do three weekly treatments, but sometimes up to 6 if needed, and after 6 weeks long term benefits can sometimes be achieved. Discussed that this is an FDA approved device, but not covered by insurance and would cost $60 per session. Will  scheduled patient for 6 consecutive appointments and can cancel latter three if benefits are achieved after first three sessions.    3) Polytrauma -discussed her return to work  4) Myalgia: Trigger Point Injection  Indication: Cervical and thoracic myofascial pain not relieved by medication management and other conservative care.  Informed consent was obtained after describing risk and benefits of the procedure with the patient, this includes bleeding, bruising, infection and medication side effects.  The patient wishes to proceed and has given written consent.  The patient was placed in a seated position.  The area of pain was marked and prepped with Betadine.  It was entered with a 25-gauge 1/2 inch needle and a total of 5 mL of 1% lidocaine and normal saline was injected into a total of 4 trigger points, after negative draw back for blood.  The patient tolerated the procedure well.  Post procedure instructions were given.    5) Shortness of breath: -discussed cardiopulmonary rehab in the future

## 2022-10-31 ENCOUNTER — Telehealth (HOSPITAL_COMMUNITY): Payer: Self-pay | Admitting: *Deleted

## 2022-10-31 MED ORDER — METOPROLOL SUCCINATE ER 25 MG PO TB24: 12.5000 mg | ORAL_TABLET | Freq: Every day | ORAL | 3 refills | Status: DC

## 2022-10-31 NOTE — Telephone Encounter (Signed)
Patient becoming symptomatic with heart rates in the 40s - some shortness of breath and lightheadedness with ambulation. Discussed with Adline Peals PA will decrease metoprolol to 12.'5mg'$  daily at bedtime. Call in a week with response. Pt in agreement.

## 2022-11-01 ENCOUNTER — Telehealth: Payer: Self-pay | Admitting: Cardiology

## 2022-11-01 NOTE — Telephone Encounter (Signed)
    Received a page from our answering service this evening. I called and spoke with patient.  Patient is followed by our atrial fibrillation clinic, last seen 06/30/2022.  She reports that she incidentally discovered that she is possibly in atrial fibrillation with heart rate between 100-120 this evening per her cardia mobile device.  As of her last office visit, patient was continued on flecainide 100 mg twice daily and Toprol XL 25 mg daily.  Patient called the A-fib clinic yesterday 3/12 reporting that she was noting some heart rates down into the 40s.  Patient was concerned because she has been noting progressive shortness of breath with decreased exertional tolerance with ambulation since significant injury to spine, ribs, including left pneumothorax following injury due to a falling tree branch.  She says that she became worried that her low heart rate was causing the symptoms.  Per telephone encounter notes, patient advised to decrease her Toprol to 12.5 mg daily with patient taking this reduced dose first yesterday evening, 3/12.  Despite possible atrial fibrillation and elevated heart rate, patient denies any sensation of palpitations, chest pain, shortness of breath, lightheadedness, dizziness.  She states that if she had not checked her heart rate which she does several times daily, she would not have known that her heart rate was elevated because she has absolutely no symptoms.   A review of patient's most recent office visits back through November of last year shows that patient is chronically bradycardic with heart rate in the 40s to 50s.  She has previously been without symptoms despite low resting heart rate.  It is unclear what might have triggered her atrial fibrillation this evening.    Given that patient has a history of very paroxysmal atrial fibrillation and is currently without any symptoms, advised her to go ahead and take 12.5 mg of Toprol-XL now (typically takes at 11 PM).  She will  check her heart rate again at 11 PM, and if her heart rate is above 110 bpm, she has been instructed to take a second dose of 12.5 mg of Toprol-XL to get her back to what was previously her daily dose of 25 mg.  I reviewed emergency precautions with patient and advised her to call 911 or proceed to the emergency department if she develops any symptoms including chest pain, palpitations, shortness of breath, dizziness/lightheadedness.  Patient confirms understanding of both medication plan as well as emergency precautions.  Will route this encounter to A-fib clinic team.  She has an appointment scheduled with Dr. Myles Gip on May 14, but will need to be seen in the next week.  Of note, per last atrial fibrillation clinic office visit note, patient with CHA2DS2-VASc score 1, and as such, she was not anticoagulated.  Lily Kocher, PA-C

## 2022-11-02 MED ORDER — METOPROLOL SUCCINATE ER 25 MG PO TB24: 12.5000 mg | ORAL_TABLET | Freq: Two times a day (BID) | ORAL | 3 refills | Status: DC

## 2022-11-02 NOTE — Telephone Encounter (Signed)
Per Adline Peals PA will go back to metoprolol '25mg'$  daily - pt will take her metoprolol 1/2 tablet twice a day and call with update early next week.

## 2022-11-02 NOTE — Addendum Note (Signed)
Addended by: Juluis Mire on: 11/02/2022 03:35 PM   Modules accepted: Orders

## 2022-11-02 NOTE — Telephone Encounter (Signed)
Pt husband states pt is feeling much better today - she is currently teaching a class and will call back later to further discuss.

## 2022-12-08 ENCOUNTER — Encounter
Payer: No Typology Code available for payment source | Attending: Physical Medicine and Rehabilitation | Admitting: Physical Medicine and Rehabilitation

## 2022-12-08 VITALS — BP 125/78 | HR 56 | Ht 67.0 in | Wt 192.2 lb

## 2022-12-08 DIAGNOSIS — M791 Myalgia, unspecified site: Secondary | ICD-10-CM | POA: Insufficient documentation

## 2022-12-08 DIAGNOSIS — M25571 Pain in right ankle and joints of right foot: Secondary | ICD-10-CM | POA: Diagnosis present

## 2022-12-08 DIAGNOSIS — G5621 Lesion of ulnar nerve, right upper limb: Secondary | ICD-10-CM | POA: Diagnosis not present

## 2022-12-08 MED ORDER — SODIUM CHLORIDE (PF) 0.9 % IJ SOLN
2.0000 mL | Freq: Every day | INTRAMUSCULAR | Status: AC
  Administered 2022-12-08: 2 mL via INTRAVENOUS

## 2022-12-08 MED ORDER — LIDOCAINE HCL 1 % IJ SOLN
3.0000 mL | Freq: Once | INTRAMUSCULAR | Status: AC
  Administered 2022-12-08: 3 mL

## 2022-12-08 NOTE — Progress Notes (Signed)
Trigger Point Injection  Indication: Thoracic myofascial pain not relieved by medication management and other conservative care.  Informed consent was obtained after describing risk and benefits of the procedure with the patient, this includes bleeding, bruising, infection and medication side effects.  The patient wishes to proceed and has given written consent.  The patient was placed in a seated position.  The area of pain was marked and prepped with Betadine.  It was entered with a 25-gauge 1/2 inch needle and a total of 5 mL of 1% lidocaine and normal saline was injected into a total of 4 trigger points, after negative draw back for blood.  The patient tolerated the procedure well.  Post procedure instructions were given.  

## 2022-12-08 NOTE — Progress Notes (Signed)

## 2023-01-02 ENCOUNTER — Ambulatory Visit: Payer: BC Managed Care – PPO | Attending: Cardiovascular Disease | Admitting: Cardiovascular Disease

## 2023-01-02 ENCOUNTER — Encounter: Payer: Self-pay | Admitting: Cardiovascular Disease

## 2023-01-02 VITALS — BP 126/78 | HR 49 | Ht 67.0 in | Wt 196.6 lb

## 2023-01-02 DIAGNOSIS — I480 Paroxysmal atrial fibrillation: Secondary | ICD-10-CM

## 2023-01-02 NOTE — Progress Notes (Signed)
Electrophysiology Office Note:    Date:  01/02/2023   ID:  Terri Drake, DOB 1961-07-22, MRN [id number]  PCP:  Moshe Cipro, NP   [facility] Providers Cardiologist:  Meriam Sprague, MD Cardiology APP:  Danice Goltz, PA     Referring MD: Danice Goltz, PA   History of Present Illness:    Terri Drake is a 62 y.o. female with a hx listed below, significant for atrial fibrillation, referred for arrhythmia management.  She was initially diagnosed with atrial fibrillation in May 2022 during follow-up with her primary care physician.  She had rapid ventricular rates, but was unaware of her arrhythmia.  She presented to the emergency department in August 2022 with symptoms of elevated heart rate, nausea and lightheadedness.  Her beta-blocker dose was increased.  She was started on flecainide in late 2022.  The dose of was increased to 100 mg twice daily.  She has continued to have episodes of atrial fibrillation.  Most recent was in March.  They typically last several hours.  She is asymptomatic, but her heart rates average greater than 120 bpm.  No syncope or chest pain.  She is a professor of anthropology.  Past Medical History:  Diagnosis Date   Ankle dislocation, right, initial encounter 12/30/2021   Ankle syndesmosis disruption, right, initial encounter 12/30/2021   Arthritis    Closed displaced trimalleolar fracture of right ankle 12/30/2021   COVID 09/20/2022   Dyspnea    Dysrhythmia    a-fib   Neuromuscular disorder ([facility])    Paroxysmal atrial fibrillation ([facility])    Pneumonia    as a child    Past Surgical History:  Procedure Laterality Date   HARDWARE REMOVAL Right 10/23/2022   Procedure: HARDWARE REMOVAL;  Surgeon: Myrene Galas, MD;  Location: [facility] OR;  Service: Orthopedics;  Laterality: Right;   ORIF ANKLE FRACTURE Right 12/29/2021   Procedure: OPEN REDUCTION INTERNAL FIXATION (ORIF) ANKLE FRACTURE;  Surgeon: Myrene Galas, MD;   Location: [facility] OR;  Service: Orthopedics;  Laterality: Right;    Current Medications: Current Meds  Medication Sig   Biotin 5000 MCG TABS Take 5,000 mcg by mouth daily.   Cholecalciferol (VITAMIN D3) 50 MCG (2000 UT) CAPS Take 4,000 Units by mouth daily.   Current Facility-Administered Medications for the 01/02/23 encounter (Office Visit) with Scarleth Brame, Roberts Gaudy, MD  Medication   sodium chloride (PF) 0.9 % injection 2 mL     Allergies:   Elemental sulfur, Gluten meal, and Sulfa antibiotics   Social and Family History: Reviewed in Epic  ROS:   Please see the history of present illness.    All other systems reviewed and are negative.  EKGs/Labs/Other Studies Reviewed Today:    Echocardiogram:  TTE 01/02/2022 EF 65-70%. Normal LA size   Monitors:   Stress testing:  ETT 08/12/21 No evidence of ischemia  Advanced imaging:   Cardiac catherization   EKG:  Last EKG results: today - sinus bradycardia, HR 49 bpm   Recent Labs: 01/10/2022: TSH 1.076 01/14/2022: ALT 30; Magnesium 2.2 10/23/2022: BUN 11; Creatinine, Ser 0.97; Hemoglobin 13.8; Platelets 219; Potassium 4.2; Sodium 134     Physical Exam:    VS:  BP 126/78   Pulse (!) 49   Ht 5\' 7"  (1.702 m)   Wt 196 lb 9.6 oz (89.2 kg)   SpO2 98%   BMI 30.79 kg/m     Wt Readings from Last 3 Encounters:  01/02/23 196 lb 9.6 oz (89.2  kg)  12/08/22 192 lb 3.2 oz (87.2 kg)  10/26/22 194 lb (88 kg)     GEN: Well nourished, well developed in no acute distress CARDIAC: RRR, no murmurs, rubs, gallops RESPIRATORY:  Normal work of breathing MUSCULOSKELETAL: no edema    ASSESSMENT & PLAN:    Paroxysmal Atrial fibrillation On flecainide 100mg  BID with occasional breakthrough episodes of AF She is asymptomatic but V-rates are uncontrolled Be cause she has failed flecainide and has Tachy-brady, I recommended ablation. CHA2DS2-VASc is 1 for female; she will need to be on anticoagulation prior to ablation and for 90 days  after  We discussed the indication, rationale, logistics, anticipated benefits, and potential risks of the ablation procedure including but not limited to -- bleed at the groin access site, chest pain, damage to nearby organs such as the diaphragm, lungs, or esophagus, need for a drainage tube, or prolonged hospitalization. I explained that the risk for stroke, heart attack, need for open chest surgery, or even death is very low but not zero. she  expressed understanding and wishes to proceed.   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  Concerns regarding medicines are outlined above.  Orders Placed This Encounter  Procedures   EKG 12-Lead   No orders of the defined types were placed in this encounter.    Signed, Maurice Small, MD  01/02/2023 10:36 AM    [facility]

## 2023-01-02 NOTE — Patient Instructions (Addendum)
Medication Instructions:  Your physician recommends that you continue on your current medications as directed. Please refer to the Current Medication list given to you today. *If you need a refill on your cardiac medications before your next appointment, please call your pharmacy*  Testing/Procedures: CBC and BMET at next office visit on Monday, 07/16/23 - you do not need to be fasting  Atrial Fibrillation Ablation  Your physician has recommended that you have an ablation. Catheter ablation is a medical procedure used to treat some cardiac arrhythmias (irregular heartbeats). During catheter ablation, a long, thin, flexible tube is put into a blood vessel in your groin (upper thigh), or neck. This tube is called an ablation catheter. It is then guided to your heart through the blood vessel. Radio frequency waves destroy small areas of heart tissue where abnormal heartbeats may cause an arrhythmia to start. Please see the instruction sheet given to you today.  You are scheduled for Atrial Fibrillation Ablation on Wednesday, December 18 with Dr. Halford Chessman.Please arrive at the Main Entrance A at [facility]: [address] [place], [place] [address] at 6:30 AM    Follow-Up: At [facility], you and your health needs are our priority.  As part of our continuing mission to provide you with exceptional heart care, we have created designated Provider Care Teams.  These Care Teams include your primary Cardiologist (physician) and Advanced Practice Providers (APPs -  Physician Assistants and Nurse Practitioners) who all work together to provide you with the care you need, when you need it.  We recommend signing up for the patient portal called "MyChart".  Sign up information is provided on this After Visit Summary.  MyChart is used to connect with patients for Virtual Visits (Telemedicine).  Patients are able to view lab/test results, encounter notes, upcoming appointments, etc.  Non-urgent  messages can be sent to your provider as well.   To learn more about what you can do with MyChart, go to [email or web address].    Your next appointment:   Monday, July 16, 2023  Provider:   York Pellant, MD

## 2023-01-07 ENCOUNTER — Encounter: Payer: Self-pay | Admitting: Cardiovascular Disease

## 2023-01-16 ENCOUNTER — Other Ambulatory Visit (HOSPITAL_COMMUNITY): Payer: Self-pay

## 2023-01-19 ENCOUNTER — Encounter
Payer: No Typology Code available for payment source | Attending: Physical Medicine and Rehabilitation | Admitting: Physical Medicine and Rehabilitation

## 2023-01-19 ENCOUNTER — Other Ambulatory Visit: Payer: Self-pay

## 2023-01-19 VITALS — BP 145/71 | HR 54 | Ht 67.0 in | Wt 197.0 lb

## 2023-01-19 DIAGNOSIS — R0602 Shortness of breath: Secondary | ICD-10-CM | POA: Diagnosis present

## 2023-01-19 DIAGNOSIS — E559 Vitamin D deficiency, unspecified: Secondary | ICD-10-CM

## 2023-01-19 DIAGNOSIS — I480 Paroxysmal atrial fibrillation: Secondary | ICD-10-CM | POA: Diagnosis not present

## 2023-01-19 DIAGNOSIS — S9304XA Dislocation of right ankle joint, initial encounter: Secondary | ICD-10-CM

## 2023-01-19 DIAGNOSIS — M791 Myalgia, unspecified site: Secondary | ICD-10-CM

## 2023-01-19 NOTE — Addendum Note (Signed)
Addended by: Horton Chin on: 01/19/2023 11:42 AM   Modules accepted: Level of Service

## 2023-01-19 NOTE — Progress Notes (Addendum)
Subjective:    Patient ID: Terri Drake, female    DOB: [birth year], 62 y.o.   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 Carola Frost removed hardware -she was able to do her [place] trip in December! -getting manual therapy  4) Shortness of breath: -still experiencing  5) Atrial fibrillation: -discussed plan for cardioversion   Pain Inventory Average Pain 4 Pain Right Now 6 My pain is  .  In the last 24 hours, has pain interfered with the following? General activity 3 Relation with others 0 Enjoyment of life 5 What TIME of day is your pain at its worst? morning , daytime, and evening Sleep (in general) Fair  Pain is worse with: standing and some activites Pain improves with: therapy/exercise Relief from Meds: 0  Family History  Problem Relation Age of Onset   Heart disease Neg Hx        in either parent   Social History   Socioeconomic History   Marital status: Married    Spouse name: Not on file   Number of children: Not on file   Years of education: Not on file   Highest education level: Not on file  Occupational History   Not on file  Tobacco Use   Smoking status: Never   Smokeless tobacco: Never   Tobacco comments:    Never smoke 11/04/21  Vaping Use   Vaping Use: Never used  Substance and Sexual Activity   Alcohol use: Yes    Alcohol/week: 5.0 - 10.0 standard drinks of alcohol    Types: 5 - 10 Glasses of wine per week    Comment: 1-2 glasses nightly as of 10/20/22   Drug use: Never   Sexual activity: Not on  file  Other Topics Concern   Not on file  Social History Narrative   Not on file   Social Determinants of Health   Financial Resource Strain: Not on file  Food Insecurity: Not on file  Transportation Needs: Not on file  Physical Activity: Not on file  Stress: Not on file  Social Connections: Not on file   Past Surgical History:  Procedure Laterality Date   HARDWARE REMOVAL Right 10/23/2022   Procedure: HARDWARE REMOVAL;  Surgeon: Myrene Galas, MD;  Location: [facility] OR;  Service: Orthopedics;  Laterality: Right;   ORIF ANKLE FRACTURE Right 12/29/2021   Procedure: OPEN REDUCTION INTERNAL FIXATION (ORIF) ANKLE FRACTURE;  Surgeon: Myrene Galas, MD;  Location: [facility] OR;  Service: Orthopedics;  Laterality: Right;   Past Surgical History:  Procedure Laterality Date   HARDWARE REMOVAL Right 10/23/2022   Procedure: HARDWARE REMOVAL;  Surgeon: Myrene Galas, MD;  Location: [facility] OR;  Service: Orthopedics;  Laterality: Right;   ORIF ANKLE FRACTURE Right 12/29/2021   Procedure: OPEN REDUCTION INTERNAL FIXATION (ORIF) ANKLE FRACTURE;  Surgeon: Myrene Galas, MD;  Location: [facility] OR;  Service: Orthopedics;  Laterality: Right;   Past Medical History:  Diagnosis Date   Ankle dislocation, right, initial encounter 12/30/2021   Ankle syndesmosis disruption, right, initial encounter 12/30/2021  Arthritis    Closed displaced trimalleolar fracture of right ankle 12/30/2021   COVID 09/20/2022   Dyspnea    Dysrhythmia    a-fib   Neuromuscular disorder (HCC)    Paroxysmal atrial fibrillation (HCC)    Pneumonia    as a child   BP (!) 145/71   Pulse (!) 54   Ht 5\' 7"  (1.702 m)   Wt 197 lb (89.4 kg)   SpO2 100%   BMI 30.85 kg/m   Opioid Risk Score:   Fall Risk Score:  `1  Depression screen [facility] 2/9     01/19/2023   10:10 AM 10/26/2022    9:32 AM 10/06/2022   11:26 AM 02/14/2022   11:00 AM  Depression screen PHQ 2/9  Decreased Interest 3 0 0 1  Down, Depressed, Hopeless 1 0 0 1  PHQ - 2 Score 4 0  0 2  Altered sleeping    3  Tired, decreased energy    2  Change in appetite    0  Feeling bad or failure about yourself     0  Trouble concentrating    1  Moving slowly or fidgety/restless    0  Suicidal thoughts    0  PHQ-9 Score    8  Difficult doing work/chores    Somewhat difficult      Review of Systems  Musculoskeletal:  Positive for back pain, gait problem and neck pain.  All other systems reviewed and are negative.      Objective:   Physical Exam  Gen: no distress, normal appearing, weight 197 lbs, BMI 30.85, 135/83 HEENT: oral mucosa pink and moist, NCAT Cardio: Reg rate Chest: normal effort, normal rate of breathing Abd: soft, non-distended Ext: no edema Psych: pleasant, normal affect Skin: intact Neuro: Right foot range of motion is restricted in plantarflexion, muscle points tender Full range of motion in upper extremities Instability in ambulation.        Assessment & Plan:   1) Numbness and tingling in both hands: -discussed that this is likely C8-T1 nerve root -MRI report reviewed.  -discussed that EMG/NCS scheduled  2) Right ankle pain: -Discussed Qutenza as an option for neuropathic pain control. Discussed that this is a capsaicin patch, stronger than capsaicin cream. Discussed that it is currently approved for diabetic peripheral neuropathy and post-herpetic neuralgia, but that it has also shown benefit in treating other forms of neuropathy. Provided patient with link to site to learn more about the patch: https://www.clark.biz/. Discussed that the patch would be placed in office and benefits usually last 3 months. Discussed that unintended exposure to capsaicin can cause severe irritation of eyes, mucous membranes, respiratory tract, and skin, but that Qutenza is a local treatment and does not have the systemic side effects of other nerve medications. Discussed that there may be pain, itching, erythema, and decreased sensory function associated with  the application of Qutenza. Side effects usually subside within 1 week. A cold pack of analgesic medications can help with these side effects. Blood pressure can also be increased due to pain associated with administration of the patch.   -continue manual therapy  Discussed extracorporeal shockwave therapy as a modality for treatment. Discussed that the device looks and feels like a massage gun and I would move it over the area of pain for about 10 minutes. The device releases sound waves to the area of pain and helps to improve blood flow and circulation to improve the healing process. Discuss that this  initially induces inflammation and can sometimes cause short-term increase in pain. Discussed that we typically do three weekly treatments, but sometimes up to 6 if needed, and after 6 weeks long term benefits can sometimes be achieved. Discussed that this is an FDA approved device, but not covered by insurance and would cost $60 per session. Will scheduled patient for 6 consecutive appointments and can cancel latter three if benefits are achieved after first three sessions.    3) Polytrauma -discussed her return to work  4) Myalgia: -continue red light therapy  -continue heat  -continue therapy to break up scar tissues -continue tens unit -Prescribed Zynex Nexwave  5) Shortness of breath: -discussed cardiopulmonary rehab in the future -referred to pulmology  6) Atrial fibrillation: -discussed plan for cardioversion -continue flecanide  -continue metoprolol  7) Vitamin D deficiency: check vitamin D level with labs  8) Hyponatremia: -discussed that recent Na level is 134  >40 minutes spent in discussion of her pain, her progress with therapy, her vitamin D levels, discussed her plan for cardioversion, trialed TENS unit, referred to pulmonology, discussed her current shortness of breath, discussed petichiae after her scar tissue treatment, reviewed her March labs with her

## 2023-01-19 NOTE — Patient Instructions (Signed)
Joovv red light therapy

## 2023-03-12 ENCOUNTER — Encounter: Payer: Self-pay | Admitting: Physical Medicine and Rehabilitation

## 2023-03-12 ENCOUNTER — Encounter
Payer: No Typology Code available for payment source | Attending: Physical Medicine and Rehabilitation | Admitting: Physical Medicine and Rehabilitation

## 2023-03-12 VITALS — BP 134/67 | HR 51 | Ht 67.0 in | Wt 196.0 lb

## 2023-03-12 DIAGNOSIS — R200 Anesthesia of skin: Secondary | ICD-10-CM | POA: Diagnosis not present

## 2023-03-12 DIAGNOSIS — R0602 Shortness of breath: Secondary | ICD-10-CM

## 2023-03-12 DIAGNOSIS — M25571 Pain in right ankle and joints of right foot: Secondary | ICD-10-CM

## 2023-03-12 DIAGNOSIS — T07XXXA Unspecified multiple injuries, initial encounter: Secondary | ICD-10-CM | POA: Diagnosis present

## 2023-03-12 DIAGNOSIS — R202 Paresthesia of skin: Secondary | ICD-10-CM

## 2023-03-12 DIAGNOSIS — M791 Myalgia, unspecified site: Secondary | ICD-10-CM | POA: Diagnosis not present

## 2023-03-12 DIAGNOSIS — M6289 Other specified disorders of muscle: Secondary | ICD-10-CM | POA: Diagnosis present

## 2023-03-12 DIAGNOSIS — I4891 Unspecified atrial fibrillation: Secondary | ICD-10-CM

## 2023-03-12 NOTE — Progress Notes (Signed)
Subjective:    Patient ID: Terri Drake, female    DOB: 09-22-1960, 62 y.o.   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 Carola Frost removed hardware -she was able to do her [place] trip in December! -getting manual therapy  4) Shortness of breath: -still experiencing  5) Atrial fibrillation: -discussed plan for cardioversion  6) Polytrauma:  -regressed after trip -restarted working with Kevin Fenton when he got back -had areas of inflammation after her trip in multiple muscles    Pain Inventory Average Pain 4 Pain Right Now 4 My pain is  unsure.  In the last 24 hours, has pain interfered with the following? General activity 3 Relation with others 0 Enjoyment of life 5 What TIME of day is your pain at its worst? morning , daytime, and evening Sleep (in general) Fair  Pain is worse with: standing and some activites Pain improves with: therapy/exercise Relief from Meds: 0  Family History  Problem Relation Age of Onset   Heart disease Neg Hx        in either parent   Social History   Socioeconomic History   Marital status: Married    Spouse name: Not on file   Number of children: Not on file   Years of education: Not on file   Highest education level: Not on file  Occupational History   Not on file  Tobacco Use   Smoking status: Never   Smokeless tobacco: Never   Tobacco comments:    Never smoke 11/04/21  Vaping Use   Vaping status: Never  Used  Substance and Sexual Activity   Alcohol use: Yes    Alcohol/week: 5.0 - 10.0 standard drinks of alcohol    Types: 5 - 10 Glasses of wine per week    Comment: 1-2 glasses nightly as of 10/20/22   Drug use: Never   Sexual activity: Not on file  Other Topics Concern   Not on file  Social History Narrative   Not on file   Social Determinants of Health   Financial Resource Strain: Not on file  Food Insecurity: Not on file  Transportation Needs: Not on file  Physical Activity: Not on file  Stress: Not on file  Social Connections: Not on file   Past Surgical History:  Procedure Laterality Date   HARDWARE REMOVAL Right 10/23/2022   Procedure: HARDWARE REMOVAL;  Surgeon: Myrene Galas, MD;  [facility] OR;  Service: Orthopedics;  Laterality: Right;   ORIF ANKLE FRACTURE Right 12/29/2021   Procedure: OPEN REDUCTION INTERNAL FIXATION (ORIF) ANKLE FRACTURE;  Surgeon: Myrene Galas, MD;  Location: [facility] OR;  Service: Orthopedics;  Laterality: Right;   Past Surgical History:  Procedure Laterality Date   HARDWARE REMOVAL Right 10/23/2022   Procedure: HARDWARE REMOVAL;  Surgeon: Myrene Galas, MD;  Location: [facility] OR;  Service: Orthopedics;  Laterality: Right;   ORIF ANKLE FRACTURE Right 12/29/2021   Procedure: OPEN REDUCTION INTERNAL FIXATION (ORIF) ANKLE FRACTURE;  Surgeon: Myrene Galas, MD;  Location: [facility] OR;  Service: Orthopedics;  Laterality: Right;   Past Medical History:  Diagnosis Date   Ankle dislocation, right, initial encounter 12/30/2021   Ankle syndesmosis disruption, right, initial encounter 12/30/2021   Arthritis    Closed displaced trimalleolar fracture of right ankle 12/30/2021   COVID 09/20/2022   Dyspnea    Dysrhythmia    a-fib   Neuromuscular disorder (HCC)    Paroxysmal atrial fibrillation (HCC)    Pneumonia    as a child   There were no vitals taken for this visit.  Opioid Risk Score:   Fall Risk Score:  `1  Depression screen [facility] 2/9     01/19/2023    10:10 AM 10/26/2022    9:32 AM 10/06/2022   11:26 AM 02/14/2022   11:00 AM  Depression screen PHQ 2/9  Decreased Interest 3 0 0 1  Down, Depressed, Hopeless 1 0 0 1  PHQ - 2 Score 4 0 0 2  Altered sleeping    3  Tired, decreased energy    2  Change in appetite    0  Feeling bad or failure about yourself     0  Trouble concentrating    1  Moving slowly or fidgety/restless    0  Suicidal thoughts    0  PHQ-9 Score    8  Difficult doing work/chores    Somewhat difficult      Review of Systems  Musculoskeletal:  Positive for back pain, gait problem and neck pain.  All other systems reviewed and are negative.      Objective:   Physical Exam  Gen: no distress, normal appearing, weight 195 lbs, BMI 30.70, 134/67 HEENT: oral mucosa pink and moist, NCAT Cardio: Reg rate Chest: normal effort, normal rate of breathing Abd: soft, non-distended Ext: no edema Psych: pleasant, normal affect Skin: intact Neuro: Right foot range of motion is restricted in plantarflexion, muscle points tender Full range of motion in upper extremities Instability in ambulation.        Assessment & Plan:   1) Numbness and tingling in both hands: -discussed that this is likely C8-T1 nerve root -MRI report reviewed.  -discussed that EMG/NCS scheduled  2) Right ankle pain: -Discussed Qutenza as an option for neuropathic pain control. Discussed that this is a capsaicin patch, stronger than capsaicin cream. Discussed that it is currently approved for diabetic peripheral neuropathy and post-herpetic neuralgia, but that it has also shown benefit in treating other forms of neuropathy. Provided patient with link to site to learn more about the patch: https://www.clark.biz/. Discussed that the patch would be placed in office and benefits usually last 3 months. Discussed that unintended exposure to capsaicin can cause severe irritation of eyes, mucous membranes, respiratory tract, and skin, but that Qutenza is a  local treatment and does not have the systemic side effects of other nerve medications. Discussed that there may be pain, itching, erythema, and decreased sensory function associated with the application of Qutenza. Side effects usually subside within 1 week. A cold pack of analgesic medications can help with these side effects. Blood pressure can also be increased due to pain associated with administration of the patch.   -continue manual therapy  -continue heat  Discussed extracorporeal shockwave therapy as a modality for treatment. Discussed that the device looks and feels like a massage gun and I would move it over the area of pain for about 10 minutes. The device releases sound waves to  the area of pain and helps to improve blood flow and circulation to improve the healing process. Discuss that this initially induces inflammation and can sometimes cause short-term increase in pain. Discussed that we typically do three weekly treatments, but sometimes up to 6 if needed, and after 6 weeks long term benefits can sometimes be achieved. Discussed that this is an FDA approved device, but not covered by insurance and would cost $60 per session. Will scheduled patient for 6 consecutive appointments and can cancel latter three if benefits are achieved after first three sessions.    3) Polytrauma -discussed her return to work  4) Myalgia: -continue red light therapy  -continue heat  -continue therapy to break up scar tissues -continue tens unit -Prescribed Zynex Nexwave -continue massage therapy -continue tens unit -d/c robaxin  5) Shortness of breath: -discussed cardiopulmonary rehab in the future -referred to pulmology  6) Atrial fibrillation: -discussed plan for cardioversion in December because of the number of breakout she had -continue flecanide, discussed plan for d/c after ablation -continue metoprolol, discussed that she tried a lower dose of metoprolol  7) Vitamin D deficiency:  check vitamin D level with labs  8) Hyponatremia: -discussed that recent Na level is 134  9. Shortness of breath: -discussed pulmonology referral -discussed cardioablation December

## 2023-03-12 NOTE — Patient Instructions (Signed)
Joovv red light therapy

## 2023-03-20 ENCOUNTER — Institutional Professional Consult (permissible substitution): Payer: BC Managed Care – PPO | Admitting: Pulmonary Disease

## 2023-03-21 ENCOUNTER — Ambulatory Visit: Payer: BC Managed Care – PPO

## 2023-03-21 ENCOUNTER — Ambulatory Visit (INDEPENDENT_AMBULATORY_CARE_PROVIDER_SITE_OTHER): Payer: No Typology Code available for payment source | Admitting: Pulmonary Disease

## 2023-03-21 ENCOUNTER — Encounter: Payer: Self-pay | Admitting: Pulmonary Disease

## 2023-03-21 VITALS — BP 124/70 | HR 53 | Ht 67.0 in | Wt 193.0 lb

## 2023-03-21 DIAGNOSIS — R0602 Shortness of breath: Secondary | ICD-10-CM | POA: Diagnosis not present

## 2023-03-21 MED ORDER — FLUTICASONE-SALMETEROL 115-21 MCG/ACT IN AERO: 2.0000 | INHALATION_SPRAY | Freq: Two times a day (BID) | RESPIRATORY_TRACT | 12 refills | Status: DC

## 2023-03-21 NOTE — Patient Instructions (Addendum)
Your shortness of breath could be multifactorial due to deconditioning vs reactive airways disease/asthma vs diaphragm dysfunction vs atrial fibrillation vs chest wall restriction vs interstitial lung disease given your family history  We will check a chest x-ray today  We will schedule you for Sniff test to evaluate your diaphragm  We will schedule you for pulmonary function tests  Start advair HFA inhaler [id number] 2 puffs twice daily  Follow up in 2 months

## 2023-03-21 NOTE — Progress Notes (Signed)
Synopsis: Referred in July 2024 for shortness of breath by Sula Soda, MD  Subjective:   PATIENT ID: Terri Drake GENDER: female DOB: [birth year], MRN: [id number]  HPI  Chief Complaint  Patient presents with   Consult    Referred by PCP for increased SOB for the past year. States the SOB stated in May 2023. Has a cough that is sometimes productive, especially at night. Denies seeing any color to her phlegm.    Terri Drake is a 62 year old woman, never smoker with paroxysmal atrial fibrillation who is referred to pulmonary clinic for shortness of breath.   She reports shortness of breath since her trauma last May when a tree branch fell on her while on campus at [employer] where she works. She is a professor of anthropology. She was completely immobile for 1 month followed by decreased mobility for another 3.5 months before she started walking on her own by the end of October/November.  She is currently working with physical therapy multiple times per week and still feels more short of breath than expected with certain workouts.  She does have a cough that is intermittently productive.  She denies any wheezing.  She denies any chest pain.  She has not been trialed on inhalers for her shortness of breath.  She was treated for T11/T12 endplate fractures, right ankle fracture/dislocation, possible neuropraxia/contusion of the ulnar nerve at the cubital tunnel, left 2 through 6 rib fractures, and left scapular fracture.  She has not had a chest x-ray since last May.  MRI cervical spine 09/25/2022 showed degenerative spondylosis.  She is scheduled for an ablation of her atrial fibrillation later this year with cardiology.  She has a never smoker.  She had secondhand smoke exposure in childhood from her mother.  Her mother had idiopathic pulmonary fibrosis and leukemia.  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  She lives with her husband.  Past Medical History:   Diagnosis Date   Ankle dislocation, right, initial encounter 12/30/2021   Ankle syndesmosis disruption, right, initial encounter 12/30/2021   Arthritis    Closed displaced trimalleolar fracture of right ankle 12/30/2021   COVID 09/20/2022   Dyspnea    Dysrhythmia    a-fib   Neuromuscular disorder (HCC)    Paroxysmal atrial fibrillation (HCC)    Pneumonia    as a child     Family History  Problem Relation Age of Onset   Heart disease Neg Hx        in either parent     Social History   Socioeconomic History   Marital status: Married    Spouse name: Not on file   Number of children: Not on file   Years of education: Not on file   Highest education level: Not on file  Occupational History   Not on file  Tobacco Use   Smoking status: Never   Smokeless tobacco: Never   Tobacco comments:    Never smoke 11/04/21  Vaping Use   Vaping status: Never Used  Substance and Sexual Activity   Alcohol use: Yes    Alcohol/week: 5.0 - 10.0 standard drinks of alcohol    Types: 5 - 10 Glasses of wine per week    Comment: 1-2 glasses nightly as of 10/20/22   Drug use: Never   Sexual activity: Not on file  Other Topics Concern   Not on file  Social History Narrative   Not on file  Social Determinants of Health   Financial Resource Strain: Not on file  Food Insecurity: Not on file  Transportation Needs: Not on file  Physical Activity: Not on file  Stress: Not on file  Social Connections: Not on file  Intimate Partner Violence: Not on file     Allergies  Allergen Reactions   Elemental Sulfur     REACTION: hives   Gluten Meal Diarrhea   Sulfa Antibiotics Rash     Outpatient Medications Prior to Visit  Medication Sig Dispense Refill   acetaminophen (TYLENOL) 325 MG tablet Take 1-2 tablets (325-650 mg total) by mouth every 4 (four) hours as needed for mild pain.     Biotin 5000 MCG TABS Take 5,000 mcg by mouth daily.     Cholecalciferol (VITAMIN D3) 50 MCG (2000 UT) CAPS  Take 4,000 Units by mouth daily.     flecainide (TAMBOCOR) 100 MG tablet Take 1 tablet (100 mg total) by mouth 2 (two) times daily. 180 tablet 3   latanoprost (XALATAN) 0.005 % ophthalmic solution Place 1 drop into both eyes at bedtime.     magnesium gluconate (MAGONATE) 500 MG tablet Take 0.5 tablets (250 mg total) by mouth at bedtime. 30 tablet 0   metoprolol succinate (TOPROL-XL) 25 MG 24 hr tablet Take 0.5 tablets (12.5 mg total) by mouth 2 (two) times daily. 90 tablet 3   methocarbamol (ROBAXIN) 500 MG tablet Take 1-2 tablets (500-1,000 mg total) by mouth every 8 (eight) hours as needed for muscle spasms. (Patient not taking: Reported on 03/12/2023) 40 tablet 0   Facility-Administered Medications Prior to Visit  Medication Dose Route Frequency Provider Last Rate Last Admin   sodium chloride (PF) 0.9 % injection 2 mL  2 mL Intravenous Daily Raulkar, Drema Pry, MD   2 mL at 12/08/22 1024    Review of Systems  Constitutional:  Negative for chills, fever, malaise/fatigue and weight loss.  HENT:  Negative for congestion, sinus pain and sore throat.   Eyes: Negative.   Respiratory:  Positive for cough, sputum production and shortness of breath. Negative for hemoptysis and wheezing.   Cardiovascular:  Positive for palpitations. Negative for chest pain, orthopnea, claudication and leg swelling.  Gastrointestinal:  Negative for abdominal pain, heartburn, nausea and vomiting.  Genitourinary: Negative.   Musculoskeletal:  Positive for joint pain. Negative for myalgias.  Skin:  Negative for rash.  Neurological:  Negative for weakness.  Endo/Heme/Allergies: Negative.   Psychiatric/Behavioral: Negative.      Objective:   Vitals:   03/21/23 1058  BP: 124/70  Pulse: (!) 53  SpO2: 100%  Weight: 193 lb (87.5 kg)  Height: 5\' 7"  (1.702 m)   Physical Exam Constitutional:      General: She is not in acute distress.    Appearance: She is not ill-appearing.  HENT:     Head: Normocephalic and  atraumatic.  Eyes:     General: No scleral icterus.    Conjunctiva/sclera: Conjunctivae normal.  Cardiovascular:     Rate and Rhythm: Normal rate and regular rhythm.     Pulses: Normal pulses.     Heart sounds: Normal heart sounds. No murmur heard. Pulmonary:     Effort: Pulmonary effort is normal.     Breath sounds: Normal breath sounds. No wheezing, rhonchi or rales.  Musculoskeletal:     Right lower leg: No edema.     Left lower leg: No edema.  Skin:    General: Skin is warm and dry.  Neurological:  General: No focal deficit present.     Mental Status: She is alert.  Psychiatric:        Mood and Affect: Mood normal.        Behavior: Behavior normal.        Thought Content: Thought content normal.        Judgment: Judgment normal.       CBC    Component Value Date/Time   WBC 5.2 10/23/2022 0642   RBC 3.87 10/23/2022 0642   HGB 13.8 10/23/2022 0642   HCT 38.3 10/23/2022 0642   PLT 219 10/23/2022 0642   MCV 99.0 10/23/2022 0642   MCH 35.7 (H) 10/23/2022 0642   MCHC 36.0 10/23/2022 0642   RDW 13.3 10/23/2022 0642   LYMPHSABS 2.1 01/14/2022 0957   MONOABS 0.5 01/14/2022 0957   EOSABS 0.1 01/14/2022 0957   BASOSABS 0.1 01/14/2022 0957      Latest Ref Rng & Units 10/23/2022    6:42 AM 01/14/2022    9:57 AM 01/09/2022    6:54 AM  BMP  Glucose 70 - 99 mg/dL 161  096  045   BUN 8 - 23 mg/dL 11  14  12    Creatinine 0.44 - 1.00 mg/dL 4.09  8.11  9.14   Sodium 135 - 145 mmol/L 134  137  138   Potassium 3.5 - 5.1 mmol/L 4.2  4.3  4.3   Chloride 98 - 111 mmol/L 106  105  106   CO2 22 - 32 mmol/L 22  22  25    Calcium 8.9 - 10.3 mg/dL 8.9  9.3  9.0    Chest imaging: CT Chest 12/27/21 Cardiovascular: Nonaneurysmal aorta. Normal cardiac size. No pericardial effusion. Normal aortic contour.   Mediastinum/Nodes: Midline trachea. No thyroid mass. Esophagus within normal limits. No suspicious lymph nodes   Lungs/Pleura: Mild left apical pleural thickening. Minimal foci  of gas in the pleural space at left apex without sizable pneumothorax. No consolidation or pleural effusion.   CXR 04/03/21 1. Coarse interstitial markings bilaterally, basilar predominant, without prior exams to establish chronicity, compatible with either chronic interstitial lung disease/fibrosis or acute edema of infectious or inflammatory etiology. 2. No evidence of consolidating pneumonia or alveolar pulmonary edema.  PFT:     No data to display          Labs:  Path:  Echo 01/02/22: LV EF 65-70%. RV systolic function is normal.   Heart Catheterization:       Assessment & Plan:   Shortness of breath - Plan: DG Sniff Test, DG Chest 2 View, fluticasone-salmeterol (ADVAIR [facility]) 115-21 MCG/ACT inhaler, Pulmonary Function Test  Discussion: Terri Drake is a 62 year old woman, never smoker with paroxysmal atrial fibrillation who is referred to pulmonary clinic for shortness of breath.   Her shortness of breath could be due to deconditioning vs reactive airways disease/asthma vs diaphragm dysfunction vs atrial fibrillation vs chest wall restriction vs interstitial lung disease given her family history.   We will check a chest x-ray today.  We will schedule her for sniff test to evaluate her diaphragm function.  We will also schedule her for pulmonary function test.  Based on these findings we will consider a high-resolution CT chest scan to evaluate for interstitial lung disease.  She is to start Advair HFA inhaler 115-21 mcg 2 puffs twice daily and monitor for any changes in her cough or dyspnea.  Follow-up in 2 months.  Greater than 65 minutes was spent on this  visit and reviewing patient's chart, imaging studies, laboratory studies, reviewing CT imaging studies with patient and her husband, completing documentation and orders, as well as reviewing case with her Worker's Counsellor.  Melody Comas, MD [facility] Office:  [phone]   See Amion for personal pager PCCM on call pager [phone] until 7pm. Please call Elink 7p-7a. [phone]   Current Outpatient Medications:    acetaminophen (TYLENOL) 325 MG tablet, Take 1-2 tablets (325-650 mg total) by mouth every 4 (four) hours as needed for mild pain., Disp: , Rfl:    Biotin 5000 MCG TABS, Take 5,000 mcg by mouth daily., Disp: , Rfl:    Cholecalciferol (VITAMIN D3) 50 MCG (2000 UT) CAPS, Take 4,000 Units by mouth daily., Disp: , Rfl:    flecainide (TAMBOCOR) 100 MG tablet, Take 1 tablet (100 mg total) by mouth 2 (two) times daily., Disp: 180 tablet, Rfl: 3   fluticasone-salmeterol (ADVAIR HFA) 115-21 MCG/ACT inhaler, Inhale 2 puffs into the lungs 2 (two) times daily., Disp: 1 each, Rfl: 12   latanoprost (XALATAN) 0.005 % ophthalmic solution, Place 1 drop into both eyes at bedtime., Disp: , Rfl:    magnesium gluconate (MAGONATE) 500 MG tablet, Take 0.5 tablets (250 mg total) by mouth at bedtime., Disp: 30 tablet, Rfl: 0   metoprolol succinate (TOPROL-XL) 25 MG 24 hr tablet, Take 0.5 tablets (12.5 mg total) by mouth 2 (two) times daily., Disp: 90 tablet, Rfl: 3  Current Facility-Administered Medications:    sodium chloride (PF) 0.9 % injection 2 mL, 2 mL, Intravenous, Daily, Raulkar, Drema Pry, MD, 2 mL at 12/08/22 1024

## 2023-03-22 ENCOUNTER — Other Ambulatory Visit (HOSPITAL_COMMUNITY): Payer: Self-pay

## 2023-03-23 ENCOUNTER — Encounter: Payer: Self-pay | Admitting: Pulmonary Disease

## 2023-03-23 ENCOUNTER — Telehealth: Payer: Self-pay | Admitting: Pulmonary Disease

## 2023-03-26 NOTE — Telephone Encounter (Signed)
Called patient to give appt for her Sniff test she is very concerned about this result on the xray she was not made aware she had any type of lesions please contact the patient

## 2023-03-27 NOTE — Telephone Encounter (Signed)
Spoke with pharmacy and they say patient picked advair up at not cost.  Please contact Case Manager Beth Hegler  [phone] for SNIFF approval as this is a workers comp case

## 2023-03-27 NOTE — Telephone Encounter (Signed)
Lmam for the case manager

## 2023-03-27 NOTE — Telephone Encounter (Signed)
Please follow up on the SNIFF approval with patient case manager as listed previously

## 2023-03-28 NOTE — Telephone Encounter (Signed)
Spoke to case manager and I have faxed the orders to her

## 2023-04-11 ENCOUNTER — Ambulatory Visit (HOSPITAL_COMMUNITY)
Admission: RE | Admit: 2023-04-11 | Discharge: 2023-04-11 | Disposition: A | Discharge status: Home / Self Care | Payer: No Typology Code available for payment source | Source: Ambulatory Visit | Attending: Pulmonary Disease | Admitting: Pulmonary Disease

## 2023-04-11 DIAGNOSIS — R0602 Shortness of breath: Secondary | ICD-10-CM | POA: Insufficient documentation

## 2023-05-08 NOTE — Progress Notes (Signed)
Your sniff test is normal, no issues with the function of your diaphragms.

## 2023-05-24 ENCOUNTER — Other Ambulatory Visit (HOSPITAL_COMMUNITY): Payer: Self-pay | Admitting: Physician Assistant

## 2023-06-15 ENCOUNTER — Encounter
Payer: No Typology Code available for payment source | Attending: Physical Medicine and Rehabilitation | Admitting: Physical Medicine and Rehabilitation

## 2023-06-15 VITALS — BP 139/80 | HR 58 | Ht 67.0 in | Wt 193.0 lb

## 2023-06-15 DIAGNOSIS — G5621 Lesion of ulnar nerve, right upper limb: Secondary | ICD-10-CM | POA: Diagnosis present

## 2023-06-15 DIAGNOSIS — E669 Obesity, unspecified: Secondary | ICD-10-CM | POA: Diagnosis present

## 2023-06-15 DIAGNOSIS — R059 Cough, unspecified: Secondary | ICD-10-CM | POA: Diagnosis present

## 2023-06-15 DIAGNOSIS — M25571 Pain in right ankle and joints of right foot: Secondary | ICD-10-CM | POA: Diagnosis present

## 2023-06-15 DIAGNOSIS — G8929 Other chronic pain: Secondary | ICD-10-CM | POA: Insufficient documentation

## 2023-06-15 DIAGNOSIS — M898X1 Other specified disorders of bone, shoulder: Secondary | ICD-10-CM | POA: Diagnosis present

## 2023-06-15 DIAGNOSIS — M6289 Other specified disorders of muscle: Secondary | ICD-10-CM | POA: Diagnosis not present

## 2023-06-15 NOTE — Patient Instructions (Signed)
B vitamin methylfolate and methylcobalamin

## 2023-06-15 NOTE — Progress Notes (Signed)
Subjective:    Patient ID: Terri Drake, female    DOB: [birth year], 62 y.o.   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 Carola Frost removed hardware -she was able to do her [place] trip in December! -getting manual therapy  4) Shortness of breath: -still experiencing  5) Atrial fibrillation: -discussed plan for cardioversion  6) Polytrauma:  -regressed after trip -restarted working with Kevin Fenton when he got back -had areas of inflammation after her trip in multiple muscles    Pain Inventory Average Pain 3 Pain Right Now 4 My pain is burning, stabbing, and tingling  In the last 24 hours, has pain interfered with the following? General activity 4 Relation with others 9 Enjoyment of life 8 What TIME of day is your pain at its worst? morning , daytime, evening, and night Sleep (in general) Fair  Pain is worse with: standing and some activites Pain improves with: heat/ice and therapy/exercise Relief from Meds: 0  Family History  Problem Relation Age of Onset   Heart disease Neg Hx        in either parent   Social History   Socioeconomic History   Marital status: Married    Spouse name: Not on file   Number of children: Not on file   Years of education: Not on file   Highest education level: Not on file  Occupational History   Not on file  Tobacco Use    Smoking status: Never   Smokeless tobacco: Never   Tobacco comments:    Never smoke 11/04/21  Vaping Use   Vaping status: Never Used  Substance and Sexual Activity   Alcohol use: Yes    Alcohol/week: 5.0 - 10.0 standard drinks of alcohol    Types: 5 - 10 Glasses of wine per week    Comment: 1-2 glasses nightly as of 10/20/22   Drug use: Never   Sexual activity: Not on file  Other Topics Concern   Not on file  Social History Narrative   Not on file   Social Determinants of Health   Financial Resource Strain: Not on file  Food Insecurity: Not on file  Transportation Needs: Not on file  Physical Activity: Not on file  Stress: Not on file  Social Connections: Not on file   Past Surgical History:  Procedure Laterality Date   HARDWARE REMOVAL Right 10/23/2022   Procedure: HARDWARE REMOVAL;  Surgeon: Myrene Galas, MD;  Location: [facility] OR;  Service: Orthopedics;  Laterality: Right;   ORIF ANKLE FRACTURE Right 12/29/2021   Procedure: OPEN REDUCTION INTERNAL FIXATION (ORIF) ANKLE FRACTURE;  Surgeon: Myrene Galas, MD;  Location: [facility] OR;  Service: Orthopedics;  Laterality: Right;   Past Surgical History:  Procedure Laterality Date   HARDWARE REMOVAL Right 10/23/2022   Procedure: HARDWARE REMOVAL;  Surgeon: Myrene Galas, MD;  Location: [facility] OR;  Service: Orthopedics;  Laterality: Right;  ORIF ANKLE FRACTURE Right 12/29/2021   Procedure: OPEN REDUCTION INTERNAL FIXATION (ORIF) ANKLE FRACTURE;  Surgeon: Myrene Galas, MD;  Location: [facility] OR;  Service: Orthopedics;  Laterality: Right;   Past Medical History:  Diagnosis Date   Ankle dislocation, right, initial encounter 12/30/2021   Ankle syndesmosis disruption, right, initial encounter 12/30/2021   Arthritis    Closed displaced trimalleolar fracture of right ankle 12/30/2021   COVID 09/20/2022   Dyspnea    Dysrhythmia    a-fib   Neuromuscular disorder (HCC)    Paroxysmal atrial fibrillation (HCC)    Pneumonia    as a child   BP 139/80    Pulse (!) 58   Ht 5\' 7"  (1.702 m)   Wt 193 lb (87.5 kg)   SpO2 98%   BMI 30.23 kg/m   Opioid Risk Score:   Fall Risk Score:  `1  Depression screen [facility] 2/9     01/19/2023   10:10 AM 10/26/2022    9:32 AM 10/06/2022   11:26 AM 02/14/2022   11:00 AM  Depression screen PHQ 2/9  Decreased Interest 3 0 0 1  Down, Depressed, Hopeless 1 0 0 1  PHQ - 2 Score 4 0 0 2  Altered sleeping    3  Tired, decreased energy    2  Change in appetite    0  Feeling bad or failure about yourself     0  Trouble concentrating    1  Moving slowly or fidgety/restless    0  Suicidal thoughts    0  PHQ-9 Score    8  Difficult doing work/chores    Somewhat difficult      Review of Systems  Musculoskeletal:  Positive for back pain, gait problem and neck pain.  All other systems reviewed and are negative.      Objective:   Physical Exam  Gen: no distress, normal appearing, weight 195 lbs, BMI 30.70, 134/67 HEENT: oral mucosa pink and moist, NCAT Cardio: Reg rate Chest: normal effort, normal rate of breathing Abd: soft, non-distended Ext: no edema Psych: pleasant, normal affect Skin: intact Neuro: Right foot range of motion is restricted in plantarflexion, muscle points tender Full range of motion in upper extremities Instability in ambulation.        Assessment & Plan:   1) Numbness and tingling in both hands: -discussed that this is likely C8-T1 nerve root -MRI report reviewed.  -discussed that EMG/NCS scheduled Recommended methylated B vitamin  2) Right ankle pain: -Discussed Qutenza as an option for neuropathic pain control. Discussed that this is a capsaicin patch, stronger than capsaicin cream. Discussed that it is currently approved for diabetic peripheral neuropathy and post-herpetic neuralgia, but that it has also shown benefit in treating other forms of neuropathy. Provided patient with link to site to learn more about the patch: https://www.clark.biz/. Discussed that the  patch would be placed in office and benefits usually last 3 months. Discussed that unintended exposure to capsaicin can cause severe irritation of eyes, mucous membranes, respiratory tract, and skin, but that Qutenza is a local treatment and does not have the systemic side effects of other nerve medications. Discussed that there may be pain, itching, erythema, and decreased sensory function associated with the application of Qutenza. Side effects usually subside within 1 week. A cold pack of analgesic medications can help with these side effects. Blood pressure can also be increased due to pain associated with administration of the patch.   -continue manual therapy  -  continue heat  Discussed extracorporeal shockwave therapy as a modality for treatment. Discussed that the device looks and feels like a massage gun and I would move it over the area of pain for about 10 minutes. The device releases sound waves to the area of pain and helps to improve blood flow and circulation to improve the healing process. Discuss that this initially induces inflammation and can sometimes cause short-term increase in pain. Discussed that we typically do three weekly treatments, but sometimes up to 6 if needed, and after 6 weeks long term benefits can sometimes be achieved. Discussed that this is an FDA approved device, but not covered by insurance and would cost $60 per session. Will scheduled patient for 6 consecutive appointments and can cancel latter three if benefits are achieved after first three sessions.    3) Polytrauma -discussed her return to work  4) Myalgia: -continue red light therapy  -continue heat  -continue therapy to break up scar tissues -continue tens unit -Prescribed Zynex Nexwave -continue massage therapy -continue tens unit -d/c robaxin  5) Obesity: continue to monitor weight  6) Atrial fibrillation: -discussed plan for cardioversion in December because of the number of breakout she  had -continue flecanide, discussed plan for d/c after ablation -continue metoprolol, discussed that she tried a lower dose of metoprolol  7) Vitamin D deficiency: check vitamin D level with labs  8) Hyponatremia: -discussed that recent Na level is 134  9. Shortness of breath: -discussed pulmonology referral -discussed cardioablation December   10) Cough: -continue inhaler -reviewed pulmonology note with her -discussed that her Sniff test was normal -discussed that the pulmonologist read every note and the CXR was repeat, reviewed and was stable -discussed that her mother has a history of pulmonary fibrosis -discussed that she took a walk yesterday and felt that she did very well.   11) Bradycardia: -discussed plan for cardiac ablation

## 2023-06-20 ENCOUNTER — Ambulatory Visit: Payer: BC Managed Care – PPO | Admitting: Pulmonary Disease

## 2023-06-20 DIAGNOSIS — R0602 Shortness of breath: Secondary | ICD-10-CM

## 2023-06-20 LAB — PULMONARY FUNCTION TEST
DL/VA % pred: 110 %
DL/VA: 4.53 ml/min/mmHg/L
DLCO cor % pred: 80 %
DLCO cor: 17.87 ml/min/mmHg
DLCO unc % pred: 80 %
DLCO unc: 17.87 ml/min/mmHg
FEF 25-75 Post: 0.66 L/s
FEF 25-75 Pre: 1.69 L/s
FEF2575-%Change-Post: -61 %
FEF2575-%Pred-Post: 27 %
FEF2575-%Pred-Pre: 70 %
FEV1-%Change-Post: -17 %
FEV1-%Pred-Post: 63 %
FEV1-%Pred-Pre: 77 %
FEV1-Post: 1.77 L
FEV1-Pre: 2.14 L
FEV1FVC-%Change-Post: -6 %
FEV1FVC-%Pred-Pre: 100 %
FEV6-%Change-Post: -11 %
FEV6-%Pred-Post: 70 %
FEV6-%Pred-Pre: 79 %
FEV6-Post: 2.44 L
FEV6-Pre: 2.76 L
FEV6FVC-%Change-Post: 0 %
FEV6FVC-%Pred-Post: 103 %
FEV6FVC-%Pred-Pre: 103 %
FVC-%Change-Post: -11 %
FVC-%Pred-Post: 67 %
FVC-%Pred-Pre: 76 %
FVC-Post: 2.44 L
FVC-Pre: 2.76 L
Post FEV1/FVC ratio: 72 %
Post FEV6/FVC ratio: 100 %
Pre FEV1/FVC ratio: 78 %
Pre FEV6/FVC Ratio: 100 %
RV % pred: 102 %
RV: 2.26 L
TLC % pred: 88 %
TLC: 4.85 L

## 2023-06-20 NOTE — Progress Notes (Signed)
Full PFT performed today. °

## 2023-06-20 NOTE — Patient Instructions (Signed)
Full PFT performed today. °

## 2023-06-27 ENCOUNTER — Ambulatory Visit (INDEPENDENT_AMBULATORY_CARE_PROVIDER_SITE_OTHER): Payer: No Typology Code available for payment source | Admitting: Pulmonary Disease

## 2023-06-27 ENCOUNTER — Encounter: Payer: Self-pay | Admitting: Pulmonary Disease

## 2023-06-27 VITALS — BP 120/70 | HR 66 | Ht 67.0 in | Wt 193.0 lb

## 2023-06-27 DIAGNOSIS — R942 Abnormal results of pulmonary function studies: Secondary | ICD-10-CM

## 2023-06-27 NOTE — Progress Notes (Signed)
Synopsis: Referred in July 2024 for shortness of breath by Sula Soda, MD  Subjective:   PATIENT ID: Terri Drake GENDER: female DOB: 24-Feb-1961, MRN: [id number]  HPI  Chief Complaint  Patient presents with   Follow-up    Pt is here for PFT F/U visit.   Terri Drake is a 62 year old woman, never smoker with paroxysmal atrial fibrillation who to pulmonary clinic for shortness of breath.   Initial OV 03/21/23 She reports shortness of breath since her trauma last May when a tree branch fell on her while on campus at [employer] where she works. She is a professor of anthropology. She was completely immobile for 1 month followed by decreased mobility for another 3.5 months before she started walking on her own by the end of October/November.  She is currently working with physical therapy multiple times per week and still feels more short of breath than expected with certain workouts.  She does have a cough that is intermittently productive.  She denies any wheezing.  She denies any chest pain.  She has not been trialed on inhalers for her shortness of breath.  She was treated for T11/T12 endplate fractures, right ankle fracture/dislocation, possible neuropraxia/contusion of the ulnar nerve at the cubital tunnel, left 2 through 6 rib fractures, and left scapular fracture.  She has not had a chest x-ray since last May.  MRI cervical spine 09/25/2022 showed degenerative spondylosis.  She is scheduled for an ablation of her atrial fibrillation later this year with cardiology.  She has a never smoker.  She had secondhand smoke exposure in childhood from her mother.  Her mother had idiopathic pulmonary fibrosis and leukemia.  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  She lives with her husband.  Today OV 06/27/23 They report that the inhaler initially provided some relief, but the benefits seem to have plateaued. The patient also mentions a persistent cough, but no  mucus production. They have been engaging in physical therapy and cardio sessions, but report no significant improvement in their breathing. 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 They are scheduled for a heart ablation procedure in the near future.  Past Medical History:  Diagnosis Date   Ankle dislocation, right, initial encounter 12/30/2021   Ankle syndesmosis disruption, right, initial encounter 12/30/2021   Arthritis    Closed displaced trimalleolar fracture of right ankle 12/30/2021   COVID 09/20/2022   Dyspnea    Dysrhythmia    a-fib   Neuromuscular disorder (HCC)    Paroxysmal atrial fibrillation (HCC)    Pneumonia    as a child     Family History  Problem Relation Age of Onset   Heart disease Neg Hx        in either parent     Social History   Socioeconomic History   Marital status: Married    Spouse name: Not on file   Number of children: Not on file   Years of education: Not on file   Highest education level: Not on file  Occupational History   Not on file  Tobacco Use   Smoking status: Never   Smokeless tobacco: Never   Tobacco comments:    Never smoke 11/04/21  Vaping Use   Vaping status: Never Used  Substance and Sexual Activity   Alcohol use: Yes    Alcohol/week: 5.0 - 10.0 standard drinks of alcohol  Types: 5 - 10 Glasses of wine per week    Comment: 1-2 glasses nightly as of 10/20/22   Drug use: Never   Sexual activity: Not on file  Other Topics Concern   Not on file  Social History Narrative   Not on file   Social Determinants of Health   Financial Resource Strain: Not on file  Food Insecurity: Not on file  Transportation Needs: Not on file  Physical Activity: Not on file  Stress: Not on file  Social Connections: Not on file  Intimate Partner Violence: Not on file     Allergies  Allergen Reactions   Elemental Sulfur     REACTION: hives   Gluten Meal Diarrhea    Sulfa Antibiotics Rash     Outpatient Medications Prior to Visit  Medication Sig Dispense Refill   acetaminophen (TYLENOL) 325 MG tablet Take 1-2 tablets (325-650 mg total) by mouth every 4 (four) hours as needed for mild pain.     Biotin 5000 MCG TABS Take 5,000 mcg by mouth daily.     Cholecalciferol (VITAMIN D3) 50 MCG (2000 UT) CAPS Take 4,000 Units by mouth daily.     flecainide (TAMBOCOR) 100 MG tablet TAKE 1 TABLET(100 MG) BY MOUTH TWICE DAILY 180 tablet 3   fluticasone-salmeterol (ADVAIR HFA) 115-21 MCG/ACT inhaler Inhale 2 puffs into the lungs 2 (two) times daily. 1 each 12   latanoprost (XALATAN) 0.005 % ophthalmic solution Place 1 drop into both eyes at bedtime.     magnesium gluconate (MAGONATE) 500 MG tablet Take 0.5 tablets (250 mg total) by mouth at bedtime. 30 tablet 0   metoprolol succinate (TOPROL-XL) 25 MG 24 hr tablet Take 0.5 tablets (12.5 mg total) by mouth 2 (two) times daily. 90 tablet 3   Facility-Administered Medications Prior to Visit  Medication Dose Route Frequency Provider Last Rate Last Admin   sodium chloride (PF) 0.9 % injection 2 mL  2 mL Intravenous Daily Raulkar, Drema Pry, MD   2 mL at 12/08/22 1024    Review of Systems  Constitutional:  Negative for chills, fever, malaise/fatigue and weight loss.  HENT:  Negative for congestion, sinus pain and sore throat.   Eyes: Negative.   Respiratory:  Positive for cough and shortness of breath. Negative for hemoptysis, sputum production and wheezing.   Cardiovascular:  Negative for chest pain, palpitations, orthopnea, claudication and leg swelling.  Gastrointestinal:  Negative for abdominal pain, heartburn, nausea and vomiting.  Genitourinary: Negative.   Musculoskeletal:  Negative for joint pain and myalgias.  Skin:  Negative for rash.  Neurological:  Negative for weakness.  Endo/Heme/Allergies: Negative.   Psychiatric/Behavioral: Negative.      Objective:   Vitals:   06/27/23 1332  BP: 120/70  Pulse:  66  SpO2: 99%  Weight: 193 lb (87.5 kg)  Height: 5\' 7"  (1.702 m)    Physical Exam Constitutional:      General: She is not in acute distress.    Appearance: She is not ill-appearing.  HENT:     Head: Normocephalic and atraumatic.  Eyes:     Conjunctiva/sclera: Conjunctivae normal.  Cardiovascular:     Rate and Rhythm: Normal rate and regular rhythm.     Pulses: Normal pulses.     Heart sounds: Normal heart sounds. No murmur heard. Pulmonary:     Effort: Pulmonary effort is normal.     Breath sounds: No wheezing or rhonchi.  Musculoskeletal:     Right lower leg: No edema.  Left lower leg: No edema.  Skin:    General: Skin is warm and dry.  Neurological:     General: No focal deficit present.     Mental Status: She is alert.       CBC    Component Value Date/Time   WBC 5.2 10/23/2022 0642   RBC 3.87 10/23/2022 0642   HGB 13.8 10/23/2022 0642   HCT 38.3 10/23/2022 0642   PLT 219 10/23/2022 0642   MCV 99.0 10/23/2022 0642   MCH 35.7 (H) 10/23/2022 0642   MCHC 36.0 10/23/2022 0642   RDW 13.3 10/23/2022 0642   LYMPHSABS 2.1 01/14/2022 0957   MONOABS 0.5 01/14/2022 0957   EOSABS 0.1 01/14/2022 0957   BASOSABS 0.1 01/14/2022 0957      Latest Ref Rng & Units 10/23/2022    6:42 AM 01/14/2022    9:57 AM 01/09/2022    6:54 AM  BMP  Glucose 70 - 99 mg/dL 409  811  914   BUN 8 - 23 mg/dL 11  14  12    Creatinine 0.44 - 1.00 mg/dL 7.82  9.56  2.13   Sodium 135 - 145 mmol/L 134  137  138   Potassium 3.5 - 5.1 mmol/L 4.2  4.3  4.3   Chloride 98 - 111 mmol/L 106  105  106   CO2 22 - 32 mmol/L 22  22  25    Calcium 8.9 - 10.3 mg/dL 8.9  9.3  9.0    Chest imaging: CT Chest 12/27/21 Cardiovascular: Nonaneurysmal aorta. Normal cardiac size. No pericardial effusion. Normal aortic contour.   Mediastinum/Nodes: Midline trachea. No thyroid mass. Esophagus within normal limits. No suspicious lymph nodes   Lungs/Pleura: Mild left apical pleural thickening. Minimal foci  of gas in the pleural space at left apex without sizable pneumothorax. No consolidation or pleural effusion.   CXR 04/03/21 1. Coarse interstitial markings bilaterally, basilar predominant, without prior exams to establish chronicity, compatible with either chronic interstitial lung disease/fibrosis or acute edema of infectious or inflammatory etiology. 2. No evidence of consolidating pneumonia or alveolar pulmonary edema.  PFT:    Latest Ref Rng & Units 06/20/2023    3:55 PM  PFT Results  FVC-Pre L 2.76  P  FVC-Predicted Pre % 76  P  FVC-Post L 2.44  P  FVC-Predicted Post % 67  P  Pre FEV1/FVC % % 78  P  Post FEV1/FCV % % 72  P  FEV1-Pre L 2.14  P  FEV1-Predicted Pre % 77  P  FEV1-Post L 1.77  P  DLCO uncorrected ml/min/mmHg 17.87  P  DLCO UNC% % 80  P  DLCO corrected ml/min/mmHg 17.87  P  DLCO COR %Predicted % 80  P  DLVA Predicted % 110  P  TLC L 4.85  P  TLC % Predicted % 88  P  RV % Predicted % 102  P    P Preliminary result  Non-specific pulmonary function test given reduced FEV1 and FVC with normal TLC.   Sniff test: Normal  Labs:  Path:  Echo 01/02/22: LV EF 65-70%. RV systolic function is normal.   Heart Catheterization:       Assessment & Plan:   Abnormal PFT - Plan: CT CHEST HIGH RESOLUTION  Discussion: Terri Drake is a 62 year old woman, never smoker with paroxysmal atrial fibrillation who is referred to pulmonary clinic for shortness of breath.   Her shortness of breath could be due to deconditioning vs reactive airways disease/asthma  vs atrial fibrillation vs chest wall restriction vs interstitial lung disease given her family history.   Nonspecific Pulmonary Function Pattern Pulmonary function tests show a nonspecific pattern with abnormal FEV1 and FVC but normal TLC. This could potentially be an early sign of some abnormality occurring within the lung. -Order a high-resolution CT chest scan to further investigate the lung  tissue. -Consider repeating the pulmonary function test in a year to monitor for any changes in the pattern.  Inhaler Use Patient reports initial improvement in cough with inhaler use, but the benefit seems to have plateaued. -Discontinue inhaler and monitor for any increase in cough or shortness of breath.  Muscle Weakness and Limited Range of Motion Patient reports limited range of motion in the left arm and muscle weakness in both arms. This could potentially be related to previous trauma and subsequent scarring. -Continue current physical therapy regimen including weight lifting, cardio, and deep stretches. -Continue medical massage to help loosen scar tissue.  Follow-up in 1 month to discuss results of the CT chest scan.   Melody Comas, MD [facility] Office: [phone]   See Amion for personal pager PCCM on call pager [phone] until 7pm. Please call Elink 7p-7a. [phone]   Current Outpatient Medications:    acetaminophen (TYLENOL) 325 MG tablet, Take 1-2 tablets (325-650 mg total) by mouth every 4 (four) hours as needed for mild pain., Disp: , Rfl:    Biotin 5000 MCG TABS, Take 5,000 mcg by mouth daily., Disp: , Rfl:    Cholecalciferol (VITAMIN D3) 50 MCG (2000 UT) CAPS, Take 4,000 Units by mouth daily., Disp: , Rfl:    flecainide (TAMBOCOR) 100 MG tablet, TAKE 1 TABLET(100 MG) BY MOUTH TWICE DAILY, Disp: 180 tablet, Rfl: 3   fluticasone-salmeterol (ADVAIR HFA) 115-21 MCG/ACT inhaler, Inhale 2 puffs into the lungs 2 (two) times daily., Disp: 1 each, Rfl: 12   latanoprost (XALATAN) 0.005 % ophthalmic solution, Place 1 drop into both eyes at bedtime., Disp: , Rfl:    magnesium gluconate (MAGONATE) 500 MG tablet, Take 0.5 tablets (250 mg total) by mouth at bedtime., Disp: 30 tablet, Rfl: 0   metoprolol succinate (TOPROL-XL) 25 MG 24 hr tablet, Take 0.5 tablets (12.5 mg total) by mouth 2 (two) times daily., Disp: 90 tablet, Rfl: 3  Current  Facility-Administered Medications:    sodium chloride (PF) 0.9 % injection 2 mL, 2 mL, Intravenous, Daily, Raulkar, Drema Pry, MD, 2 mL at 12/08/22 1024

## 2023-06-27 NOTE — Patient Instructions (Addendum)
Ok to stop the advair inhaler and monitor for return of cough or worsening shortness of breath  We will schedule you for Hight Resolution CT Chest scan   Your breathing tests show a non-specific breathing pattern   Follow up in 1 month

## 2023-06-29 ENCOUNTER — Ambulatory Visit (HOSPITAL_COMMUNITY)
Admission: RE | Admit: 2023-06-29 | Discharge: 2023-06-29 | Disposition: A | Discharge status: Home / Self Care | Payer: Worker's Compensation | Source: Ambulatory Visit | Attending: Pulmonary Disease

## 2023-06-29 ENCOUNTER — Encounter: Payer: Self-pay | Admitting: Pulmonary Disease

## 2023-06-29 DIAGNOSIS — R942 Abnormal results of pulmonary function studies: Secondary | ICD-10-CM | POA: Insufficient documentation

## 2023-07-06 ENCOUNTER — Telehealth: Payer: Self-pay

## 2023-07-06 MED ORDER — APIXABAN 5 MG PO TABS: 5.0000 mg | ORAL_TABLET | Freq: Two times a day (BID) | ORAL | 11 refills | Status: DC

## 2023-07-06 NOTE — Telephone Encounter (Signed)
Spoke with patient about upcoming ablation, Dr Nelly Laurence would like for her to start Eliquis 5 mg twice daily starting 1 month prior to her procedure on 08/08/23. Patient agrees with plan to start on 07/09/23. No further needs at this time

## 2023-07-12 ENCOUNTER — Other Ambulatory Visit (HOSPITAL_COMMUNITY): Payer: Self-pay | Admitting: Physician Assistant

## 2023-07-14 ENCOUNTER — Encounter: Payer: Self-pay | Admitting: Cardiovascular Disease

## 2023-07-14 ENCOUNTER — Encounter: Payer: Self-pay | Admitting: Pulmonary Disease

## 2023-07-16 ENCOUNTER — Encounter: Payer: Self-pay | Admitting: Cardiovascular Disease

## 2023-07-16 ENCOUNTER — Other Ambulatory Visit (HOSPITAL_BASED_OUTPATIENT_CLINIC_OR_DEPARTMENT_OTHER): Payer: Self-pay | Admitting: Cardiovascular Disease

## 2023-07-16 ENCOUNTER — Ambulatory Visit: Payer: BC Managed Care – PPO | Attending: Cardiovascular Disease | Admitting: Cardiovascular Disease

## 2023-07-16 VITALS — BP 124/80 | HR 53 | Ht 67.0 in | Wt 191.0 lb

## 2023-07-16 DIAGNOSIS — I480 Paroxysmal atrial fibrillation: Secondary | ICD-10-CM

## 2023-07-16 NOTE — Progress Notes (Signed)
Electrophysiology Office Note:    Date:  07/16/2023   ID:  Terri Drake, DOB [birth year], MRN [id number]  PCP:  Gweneth Dimitri, MD   [facility] Providers Cardiologist:  Meriam Sprague, MD (Inactive) Cardiology APP:  Danice Goltz, [place]  Electrophysiologist:  Maurice Small, MD     Referring MD: Moshe Cipro, FNP   History of Present Illness:    Terri Drake is a 62 y.o. female with a hx listed below, significant for atrial fibrillation, referred for arrhythmia management.  She was initially diagnosed with atrial fibrillation in May 2022 during follow-up with her primary care physician.  She had rapid ventricular rates, but was unaware of her arrhythmia.  She presented to the emergency department in August 2022 with symptoms of elevated heart rate, nausea and lightheadedness.  Her beta-blocker dose was increased.  She was started on flecainide in late 2022.  The dose of was increased to 100 mg twice daily.  She has continued to have episodes of atrial fibrillation.  Most recent was in March.  They typically last several hours.  She is asymptomatic, but her heart rates average greater than 120 bpm.  No syncope or chest pain.  She is a professor of anthropology.  Past Medical History:  Diagnosis Date   Ankle dislocation, right, initial encounter 12/30/2021   Ankle syndesmosis disruption, right, initial encounter 12/30/2021   Arthritis    Closed displaced trimalleolar fracture of right ankle 12/30/2021   COVID 09/20/2022   Dyspnea    Dysrhythmia    a-fib   Neuromuscular disorder (HCC)    Paroxysmal atrial fibrillation (HCC)    Pneumonia    as a child    Past Surgical History:  Procedure Laterality Date   HARDWARE REMOVAL Right 10/23/2022   Procedure: HARDWARE REMOVAL;  Surgeon: Myrene Galas, MD;  Location: [facility] OR;  Service: Orthopedics;  Laterality: Right;   ORIF ANKLE FRACTURE Right 12/29/2021   Procedure: OPEN REDUCTION INTERNAL FIXATION  (ORIF) ANKLE FRACTURE;  Surgeon: Myrene Galas, MD;  Location: [facility] OR;  Service: Orthopedics;  Laterality: Right;    Current Medications: Current Meds  Medication Sig   acetaminophen (TYLENOL) 325 MG tablet Take 1-2 tablets (325-650 mg total) by mouth every 4 (four) hours as needed for mild pain.   apixaban (ELIQUIS) 5 MG TABS tablet Take 1 tablet (5 mg total) by mouth 2 (two) times daily.   Biotin 5000 MCG TABS Take 5,000 mcg by mouth daily.   Cholecalciferol (VITAMIN D3) 50 MCG (2000 UT) CAPS Take 4,000 Units by mouth daily.   flecainide (TAMBOCOR) 100 MG tablet TAKE 1 TABLET(100 MG) BY MOUTH TWICE DAILY   latanoprost (XALATAN) 0.005 % ophthalmic solution Place 1 drop into both eyes at bedtime.   magnesium gluconate (MAGONATE) 500 MG tablet Take 0.5 tablets (250 mg total) by mouth at bedtime.   metoprolol succinate (TOPROL-XL) 25 MG 24 hr tablet Take 0.5 tablets (12.5 mg total) by mouth 2 (two) times daily.   Current Facility-Administered Medications for the 07/16/23 encounter (Office Visit) with Chisom Aust, Roberts Gaudy, MD  Medication   sodium chloride (PF) 0.9 % injection 2 mL     Allergies:   Elemental sulfur, Gluten meal, and Sulfa antibiotics   Social and Family History: Reviewed in Epic  ROS:   Please see the history of present illness.    All other systems reviewed and are negative.  EKGs/Labs/Other Studies Reviewed Today:    Echocardiogram:  TTE 01/02/2022 EF 65-70%. Normal LA  size   Monitors:   Stress testing:  ETT 08/12/21 No evidence of ischemia  Advanced imaging:   Cardiac catherization   EKG:  Last EKG results: today - sinus bradycardia, HR 49 bpm   Recent Labs: 10/23/2022: BUN 11; Creatinine, Ser 0.97; Hemoglobin 13.8; Platelets 219; Potassium 4.2; Sodium 134     Physical Exam:    VS:  BP 124/80 (BP Location: Right Arm, Patient Position: Sitting, Cuff Size: Large)   Pulse (!) 53   Ht 5\' 7"  (1.702 m)   Wt 191 lb (86.6 kg)   SpO2 97%   BMI 29.91  kg/m     Wt Readings from Last 3 Encounters:  07/16/23 191 lb (86.6 kg)  06/27/23 193 lb (87.5 kg)  06/15/23 193 lb (87.5 kg)     GEN: Well nourished, well developed in no acute distress CARDIAC: RRR, no murmurs, rubs, gallops RESPIRATORY:  Normal work of breathing MUSCULOSKELETAL: no edema    ASSESSMENT & PLAN:    Paroxysmal Atrial fibrillation On flecainide 100mg  BID with occasional breakthrough episodes of AF She is asymptomatic but V-rates are uncontrolled Be cause she has failed flecainide and has Tachy-brady, I recommended ablation. CHA2DS2-VASc is 1 for female; she will need to be on anticoagulation prior to ablation and for 90 days after  We discussed the indication, rationale, logistics, anticipated benefits, and potential risks of the ablation procedure including but not limited to -- bleed at the groin access site, chest pain, damage to nearby organs such as the diaphragm, lungs, or esophagus, need for a drainage tube, or prolonged hospitalization. I explained that the risk for stroke, heart attack, need for open chest surgery, or even death is very low but not zero. she  expressed understanding and wishes to proceed.   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  Concerns regarding medicines are outlined above.  Orders Placed This Encounter  Procedures   EKG 12-Lead   No orders of the defined types were placed in this encounter.    Signed, Maurice Small, MD  07/16/2023 6:21 PM    [facility]

## 2023-07-16 NOTE — Patient Instructions (Signed)
Medication Instructions:  Your physician recommends that you continue on your current medications as directed. Please refer to the Current Medication list given to you today. *If you need a refill on your cardiac medications before your next appointment, please call your pharmacy*   Lab Work: CBC and BMET today If you have labs (blood work) drawn today and your tests are completely normal, you will receive your results only by: MyChart Message (if you have MyChart) OR A paper copy in the mail If you have any lab test that is abnormal or we need to change your treatment, we will call you to review the results.   Testing/Procedures: Atrial Fibrillation Ablation - see instruction letter Your physician has recommended that you have an ablation. Catheter ablation is a medical procedure used to treat some cardiac arrhythmias (irregular heartbeats). During catheter ablation, a long, thin, flexible tube is put into a blood vessel in your groin (upper thigh), or neck. This tube is called an ablation catheter. It is then guided to your heart through the blood vessel. Radio frequency waves destroy small areas of heart tissue where abnormal heartbeats may cause an arrhythmia to start. Please see the instruction sheet given to you today.   Follow-Up: At [facility], you and your health needs are our priority.  As part of our continuing mission to provide you with exceptional heart care, we have created designated Provider Care Teams.  These Care Teams include your primary Cardiologist (physician) and Advanced Practice Providers (APPs -  Physician Assistants and Nurse Practitioners) who all work together to provide you with the care you need, when you need it.  We recommend signing up for the patient portal called "MyChart".  Sign up information is provided on this After Visit Summary.  MyChart is used to connect with patients for Virtual Visits (Telemedicine).  Patients are able to view lab/test  results, encounter notes, upcoming appointments, etc.  Non-urgent messages can be sent to your provider as well.   To learn more about what you can do with MyChart, go to [email or web address].    Your next appointment:   We will schedule follow up after your ablation  Provider:   York Pellant, MD

## 2023-07-16 NOTE — H&P (View-Only) (Signed)
 Electrophysiology Office Note:    Date:  07/16/2023   ID:  Terri Drake, DOB [birth year], MRN [id number]  PCP:  Gweneth Dimitri, MD   [facility] Providers Cardiologist:  Meriam Sprague, MD (Inactive) Cardiology APP:  Danice Goltz, [place]  Electrophysiologist:  Maurice Small, MD     Referring MD: Moshe Cipro, FNP   History of Present Illness:    Terri Drake is a 62 y.o. female with a hx listed below, significant for atrial fibrillation, referred for arrhythmia management.  She was initially diagnosed with atrial fibrillation in May 2022 during follow-up with her primary care physician.  She had rapid ventricular rates, but was unaware of her arrhythmia.  She presented to the emergency department in August 2022 with symptoms of elevated heart rate, nausea and lightheadedness.  Her beta-blocker dose was increased.  She was started on flecainide in late 2022.  The dose of was increased to 100 mg twice daily.  She has continued to have episodes of atrial fibrillation.  Most recent was in March.  They typically last several hours.  She is asymptomatic, but her heart rates average greater than 120 bpm.  No syncope or chest pain.  She is a professor of anthropology.  Past Medical History:  Diagnosis Date   Ankle dislocation, right, initial encounter 12/30/2021   Ankle syndesmosis disruption, right, initial encounter 12/30/2021   Arthritis    Closed displaced trimalleolar fracture of right ankle 12/30/2021   COVID 09/20/2022   Dyspnea    Dysrhythmia    a-fib   Neuromuscular disorder (HCC)    Paroxysmal atrial fibrillation (HCC)    Pneumonia    as a child    Past Surgical History:  Procedure Laterality Date   HARDWARE REMOVAL Right 10/23/2022   Procedure: HARDWARE REMOVAL;  Surgeon: Myrene Galas, MD;  Location: [facility] OR;  Service: Orthopedics;  Laterality: Right;   ORIF ANKLE FRACTURE Right 12/29/2021   Procedure: OPEN REDUCTION INTERNAL FIXATION  (ORIF) ANKLE FRACTURE;  Surgeon: Myrene Galas, MD;  Location: [facility] OR;  Service: Orthopedics;  Laterality: Right;    Current Medications: Current Meds  Medication Sig   acetaminophen (TYLENOL) 325 MG tablet Take 1-2 tablets (325-650 mg total) by mouth every 4 (four) hours as needed for mild pain.   apixaban (ELIQUIS) 5 MG TABS tablet Take 1 tablet (5 mg total) by mouth 2 (two) times daily.   Biotin 5000 MCG TABS Take 5,000 mcg by mouth daily.   Cholecalciferol (VITAMIN D3) 50 MCG (2000 UT) CAPS Take 4,000 Units by mouth daily.   flecainide (TAMBOCOR) 100 MG tablet TAKE 1 TABLET(100 MG) BY MOUTH TWICE DAILY   latanoprost (XALATAN) 0.005 % ophthalmic solution Place 1 drop into both eyes at bedtime.   magnesium gluconate (MAGONATE) 500 MG tablet Take 0.5 tablets (250 mg total) by mouth at bedtime.   metoprolol succinate (TOPROL-XL) 25 MG 24 hr tablet Take 0.5 tablets (12.5 mg total) by mouth 2 (two) times daily.   Current Facility-Administered Medications for the 07/16/23 encounter (Office Visit) with Chisom Aust, Roberts Gaudy, MD  Medication   sodium chloride (PF) 0.9 % injection 2 mL     Allergies:   Elemental sulfur, Gluten meal, and Sulfa antibiotics   Social and Family History: Reviewed in Epic  ROS:   Please see the history of present illness.    All other systems reviewed and are negative.  EKGs/Labs/Other Studies Reviewed Today:    Echocardiogram:  TTE 01/02/2022 EF 65-70%. Normal LA  size   Monitors:   Stress testing:  ETT 08/12/21 No evidence of ischemia  Advanced imaging:   Cardiac catherization   EKG:  Last EKG results: today - sinus bradycardia, HR 49 bpm   Recent Labs: 10/23/2022: BUN 11; Creatinine, Ser 0.97; Hemoglobin 13.8; Platelets 219; Potassium 4.2; Sodium 134     Physical Exam:    VS:  BP 124/80 (BP Location: Right Arm, Patient Position: Sitting, Cuff Size: Large)   Pulse (!) 53   Ht 5\' 7"  (1.702 m)   Wt 191 lb (86.6 kg)   SpO2 97%   BMI 29.91  kg/m     Wt Readings from Last 3 Encounters:  07/16/23 191 lb (86.6 kg)  06/27/23 193 lb (87.5 kg)  06/15/23 193 lb (87.5 kg)     GEN: Well nourished, well developed in no acute distress CARDIAC: RRR, no murmurs, rubs, gallops RESPIRATORY:  Normal work of breathing MUSCULOSKELETAL: no edema    ASSESSMENT & PLAN:    Paroxysmal Atrial fibrillation On flecainide 100mg  BID with occasional breakthrough episodes of AF She is asymptomatic but V-rates are uncontrolled Be cause she has failed flecainide and has Tachy-brady, I recommended ablation. CHA2DS2-VASc is 1 for female; she will need to be on anticoagulation prior to ablation and for 90 days after  We discussed the indication, rationale, logistics, anticipated benefits, and potential risks of the ablation procedure including but not limited to -- bleed at the groin access site, chest pain, damage to nearby organs such as the diaphragm, lungs, or esophagus, need for a drainage tube, or prolonged hospitalization. I explained that the risk for stroke, heart attack, need for open chest surgery, or even death is very low but not zero. she  expressed understanding and wishes to proceed.   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  Concerns regarding medicines are outlined above.  Orders Placed This Encounter  Procedures   EKG 12-Lead   No orders of the defined types were placed in this encounter.    Signed, Maurice Small, MD  07/16/2023 6:21 PM    [facility]

## 2023-07-17 ENCOUNTER — Other Ambulatory Visit: Payer: Self-pay | Admitting: *Deleted

## 2023-07-17 ENCOUNTER — Telehealth: Payer: Self-pay | Admitting: Internal Medicine

## 2023-07-17 ENCOUNTER — Telehealth: Payer: Self-pay | Admitting: *Deleted

## 2023-07-17 DIAGNOSIS — I480 Paroxysmal atrial fibrillation: Secondary | ICD-10-CM

## 2023-07-17 DIAGNOSIS — E875 Hyperkalemia: Secondary | ICD-10-CM

## 2023-07-17 LAB — POTASSIUM: Potassium: 4.9 mmol/L (ref 3.5–5.2)

## 2023-07-17 LAB — BASIC METABOLIC PANEL
BUN/Creatinine Ratio: 14 (ref 12–28)
BUN: 13 mg/dL (ref 8–27)
CO2: 23 mmol/L (ref 20–29)
Calcium: 9.9 mg/dL (ref 8.7–10.3)
Chloride: 101 mmol/L (ref 96–106)
Creatinine, Ser: 0.92 mg/dL (ref 0.57–1.00)
Glucose: 101 mg/dL — ABNORMAL HIGH (ref 70–99)
Potassium: 5.9 mmol/L (ref 3.5–5.2)
Sodium: 138 mmol/L (ref 134–144)
eGFR: 70 mL/min/{1.73_m2} (ref >59)

## 2023-07-17 LAB — CBC
Hematocrit: 43.6 % (ref 34.0–46.6)
Hemoglobin: 14.3 g/dL (ref 11.1–15.9)
MCH: 33.8 pg — ABNORMAL HIGH (ref 26.6–33.0)
MCHC: 32.8 g/dL (ref 31.5–35.7)
MCV: 103 fL — ABNORMAL HIGH (ref 79–97)
Platelets: 245 10*3/uL (ref 150–450)
RBC: 4.23 x10E6/uL (ref 3.77–5.28)
RDW: 14.2 % (ref 11.7–15.4)
WBC: 7.1 10*3/uL (ref 3.4–10.8)

## 2023-07-17 NOTE — Telephone Encounter (Signed)
Questions answered at appointment.

## 2023-07-17 NOTE — Telephone Encounter (Signed)
Will pass on alert to day team to follow up and call patient regarding critical potassium level of 5.9.

## 2023-07-17 NOTE — Telephone Encounter (Signed)
Received printed lab results from [facility].  Potassium drawn on 11/25 is 5.9.  Reviewed with Dr Nelly Laurence and he would like patient to have stat potassium drawn today.  Patient notified.  She is not taking any oral potassium.  Does not use salt substitute.  Eats a lot of lettuce.  Patient will come to office this morning for stat potassium lab

## 2023-07-30 ENCOUNTER — Ambulatory Visit (INDEPENDENT_AMBULATORY_CARE_PROVIDER_SITE_OTHER): Payer: No Typology Code available for payment source | Admitting: Pulmonary Disease

## 2023-07-30 ENCOUNTER — Encounter: Payer: Self-pay | Admitting: Pulmonary Disease

## 2023-07-30 VITALS — BP 138/82 | HR 63 | Temp 97.4°F | Ht 67.0 in | Wt 191.0 lb

## 2023-07-30 DIAGNOSIS — R0602 Shortness of breath: Secondary | ICD-10-CM | POA: Diagnosis not present

## 2023-07-30 DIAGNOSIS — J984 Other disorders of lung: Secondary | ICD-10-CM

## 2023-07-30 MED ORDER — FLUTICASONE-SALMETEROL 115-21 MCG/ACT IN AERO: 2.0000 | INHALATION_SPRAY | Freq: Two times a day (BID) | RESPIRATORY_TRACT | 12 refills | Status: DC

## 2023-07-30 NOTE — Progress Notes (Unsigned)
Synopsis: Referred in July 2024 for shortness of breath by Sula Soda, MD  Subjective:   PATIENT ID: Terri Drake GENDER: female DOB: October 24, 1960, MRN: [id number]  HPI  Chief Complaint  Patient presents with  . Follow-up   Madylan Bigness is a 62 year old woman, never smoker with paroxysmal atrial fibrillation who to pulmonary clinic for shortness of breath.   Initial OV 03/21/23 She reports shortness of breath since her trauma last May when a tree branch fell on her while on campus at [employer] where she works. She is a professor of anthropology. She was completely immobile for 1 month followed by decreased mobility for another 3.5 months before she started walking on her own by the end of October/November.  She is currently working with physical therapy multiple times per week and still feels more short of breath than expected with certain workouts.  She does have a cough that is intermittently productive.  She denies any wheezing.  She denies any chest pain.  She has not been trialed on inhalers for her shortness of breath.  She was treated for T11/T12 endplate fractures, right ankle fracture/dislocation, possible neuropraxia/contusion of the ulnar nerve at the cubital tunnel, left 2 through 6 rib fractures, and left scapular fracture.  She has not had a chest x-ray since last May.  MRI cervical spine 09/25/2022 showed degenerative spondylosis.  She is scheduled for an ablation of her atrial fibrillation later this year with cardiology.  She has a never smoker.  She had secondhand smoke exposure in childhood from her mother.  Her mother had idiopathic pulmonary fibrosis and leukemia.  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  She lives with her husband.  OV 06/27/23 They report that the inhaler initially provided some relief, but the benefits seem to have plateaued. The patient also mentions a persistent cough, but no mucus production. They have been engaging  in physical therapy and cardio sessions, but report no significant improvement in their breathing. 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 They are scheduled for a heart ablation procedure in the near future.  OV Today 07/30/23   Past Medical History:  Diagnosis Date  . Ankle dislocation, right, initial encounter 12/30/2021  . Ankle syndesmosis disruption, right, initial encounter 12/30/2021  . Arthritis   . Closed displaced trimalleolar fracture of right ankle 12/30/2021  . COVID 09/20/2022  . Dyspnea   . Dysrhythmia    a-fib  . Neuromuscular disorder (HCC)   . Paroxysmal atrial fibrillation (HCC)   . Pneumonia    as a child     Family History  Problem Relation Age of Onset  . Heart disease Neg Hx        in either parent     Social History   Socioeconomic History  . Marital status: Married    Spouse name: Not on file  . Number of children: Not on file  . Years of education: Not on file  . Highest education level: Not on file  Occupational History  . Not on file  Tobacco Use  . Smoking status: Never  . Smokeless tobacco: Never  . Tobacco comments:    Never smoke 11/04/21  Vaping Use  . Vaping status: Never Used  Substance and Sexual Activity  . Alcohol use: Yes    Alcohol/week: 5.0 - 10.0 standard drinks of alcohol    Types: 5 -  10 Glasses of wine per week    Comment: 1-2 glasses nightly as of 10/20/22  . Drug use: Never  . Sexual activity: Not on file  Other Topics Concern  . Not on file  Social History Narrative  . Not on file   Social Determinants of Health   Financial Resource Strain: Not on file  Food Insecurity: Not on file  Transportation Needs: Not on file  Physical Activity: Not on file  Stress: Not on file  Social Connections: Not on file  Intimate Partner Violence: Not on file     Allergies  Allergen Reactions  . Elemental Sulfur     REACTION: hives  . Gluten Meal  Diarrhea  . Sulfa Antibiotics Rash     Outpatient Medications Prior to Visit  Medication Sig Dispense Refill  . acetaminophen (TYLENOL) 325 MG tablet Take 1-2 tablets (325-650 mg total) by mouth every 4 (four) hours as needed for mild pain.    Marland Kitchen apixaban (ELIQUIS) 5 MG TABS tablet Take 1 tablet (5 mg total) by mouth 2 (two) times daily. 60 tablet 11  . Biotin 5000 MCG TABS Take 5,000 mcg by mouth daily.    . Cholecalciferol (VITAMIN D3) 50 MCG (2000 UT) CAPS Take 4,000 Units by mouth daily.    . flecainide (TAMBOCOR) 100 MG tablet TAKE 1 TABLET(100 MG) BY MOUTH TWICE DAILY 180 tablet 3  . fluticasone-salmeterol (ADVAIR HFA) 115-21 MCG/ACT inhaler Inhale 2 puffs into the lungs 2 (two) times daily. 1 each 12  . latanoprost (XALATAN) 0.005 % ophthalmic solution Place 1 drop into both eyes at bedtime.    . magnesium gluconate (MAGONATE) 500 MG tablet Take 0.5 tablets (250 mg total) by mouth at bedtime. 30 tablet 0  . metoprolol succinate (TOPROL-XL) 25 MG 24 hr tablet Take 0.5 tablets (12.5 mg total) by mouth 2 (two) times daily. 90 tablet 3   Facility-Administered Medications Prior to Visit  Medication Dose Route Frequency Provider Last Rate Last Admin  . sodium chloride (PF) 0.9 % injection 2 mL  2 mL Intravenous Daily Raulkar, Drema Pry, MD   2 mL at 12/08/22 1024    Review of Systems  Constitutional:  Negative for chills, fever, malaise/fatigue and weight loss.  HENT:  Negative for congestion, sinus pain and sore throat.   Eyes: Negative.   Respiratory:  Positive for cough and shortness of breath. Negative for hemoptysis, sputum production and wheezing.   Cardiovascular:  Negative for chest pain, palpitations, orthopnea, claudication and leg swelling.  Gastrointestinal:  Negative for abdominal pain, heartburn, nausea and vomiting.  Genitourinary: Negative.   Musculoskeletal:  Negative for joint pain and myalgias.  Skin:  Negative for rash.  Neurological:  Negative for weakness.   Endo/Heme/Allergies: Negative.   Psychiatric/Behavioral: Negative.      Objective:   Vitals:   07/30/23 0911  BP: 138/82  Pulse: 63  Temp: (!) 97.4 F (36.3 C)  TempSrc: Temporal  SpO2: 96%  Weight: 191 lb (86.6 kg)  Height: 5\' 7"  (1.702 m)     Physical Exam Constitutional:      General: She is not in acute distress.    Appearance: She is not ill-appearing.  HENT:     Head: Normocephalic and atraumatic.  Eyes:     Conjunctiva/sclera: Conjunctivae normal.  Cardiovascular:     Rate and Rhythm: Normal rate and regular rhythm.     Pulses: Normal pulses.     Heart sounds: Normal heart sounds. No murmur heard. Pulmonary:  Effort: Pulmonary effort is normal.     Breath sounds: No wheezing or rhonchi.  Musculoskeletal:     Right lower leg: No edema.     Left lower leg: No edema.  Skin:    General: Skin is warm and dry.  Neurological:     General: No focal deficit present.     Mental Status: She is alert.      CBC    Component Value Date/Time   WBC 7.1 07/16/2023 1614   WBC 5.2 10/23/2022 0642   RBC 4.23 07/16/2023 1614   RBC 3.87 10/23/2022 0642   HGB 14.3 07/16/2023 1614   HCT 43.6 07/16/2023 1614   PLT 245 07/16/2023 1614   MCV 103 (H) 07/16/2023 1614   MCH 33.8 (H) 07/16/2023 1614   MCH 35.7 (H) 10/23/2022 0642   MCHC 32.8 07/16/2023 1614   MCHC 36.0 10/23/2022 0642   RDW 14.2 07/16/2023 1614   LYMPHSABS 2.1 01/14/2022 0957   MONOABS 0.5 01/14/2022 0957   EOSABS 0.1 01/14/2022 0957   BASOSABS 0.1 01/14/2022 0957      Latest Ref Rng & Units 07/17/2023   10:19 AM 07/16/2023    4:14 PM 10/23/2022    6:42 AM  BMP  Glucose 70 - 99 mg/dL  914  782   BUN 8 - 27 mg/dL  13  11   Creatinine 9.56 - 1.00 mg/dL  2.13  0.86   BUN/Creat Ratio 12 - 28  14    Sodium 134 - 144 mmol/L  138  134   Potassium 3.5 - 5.2 mmol/L 4.9  5.9  4.2   Chloride 96 - 106 mmol/L  101  106   CO2 20 - 29 mmol/L  23  22   Calcium 8.7 - 10.3 mg/dL  9.9  8.9    Chest  imaging: HRCT Chest 06/29/23 Cardiovascular: Atherosclerotic calcification of the aorta and left anterior descending coronary artery. Enlarged pulmonic trunk and heart. No pericardial effusion.   Mediastinum/Nodes: No pathologically enlarged mediastinal or axillary lymph nodes. Hilar regions are difficult to definitively evaluate without IV contrast. Esophagus is grossly unremarkable.   Lungs/Pleura: Negative for subpleural reticulation, traction bronchiectasis/bronchiolectasis, ground glass, architectural distortion or honeycombing. Minimal bibasilar scarring. No pleural fluid. Airway is unremarkable. Minimal air trapping.  CT Chest 12/27/21 Cardiovascular: Nonaneurysmal aorta. Normal cardiac size. No pericardial effusion. Normal aortic contour.   Mediastinum/Nodes: Midline trachea. No thyroid mass. Esophagus within normal limits. No suspicious lymph nodes   Lungs/Pleura: Mild left apical pleural thickening. Minimal foci of gas in the pleural space at left apex without sizable pneumothorax. No consolidation or pleural effusion.   CXR 04/03/21 1. Coarse interstitial markings bilaterally, basilar predominant, without prior exams to establish chronicity, compatible with either chronic interstitial lung disease/fibrosis or acute edema of infectious or inflammatory etiology. 2. No evidence of consolidating pneumonia or alveolar pulmonary edema.  PFT:    Latest Ref Rng & Units 06/20/2023    3:55 PM  PFT Results  FVC-Pre L 2.76   FVC-Predicted Pre % 76   FVC-Post L 2.44   FVC-Predicted Post % 67   Pre FEV1/FVC % % 78   Post FEV1/FCV % % 72   FEV1-Pre L 2.14   FEV1-Predicted Pre % 77   FEV1-Post L 1.77   DLCO uncorrected ml/min/mmHg 17.87   DLCO UNC% % 80   DLCO corrected ml/min/mmHg 17.87   DLCO COR %Predicted % 80   DLVA Predicted % 110   TLC L 4.85  TLC % Predicted % 88   RV % Predicted % 102   Non-specific pulmonary function test given reduced FEV1 and FVC with  normal TLC.   Sniff test: Normal  Labs:  Path:  Echo 01/02/22: LV EF 65-70%. RV systolic function is normal.   Heart Catheterization:       Assessment & Plan:   Shortness of breath  Discussion: Terri Drake is a 62 year old woman, never smoker with paroxysmal atrial fibrillation who is referred to pulmonary clinic for shortness of breath.   Her shortness of breath could be due to deconditioning vs reactive airways disease/asthma vs atrial fibrillation vs chest wall restriction vs interstitial lung disease given her family history.   Nonspecific Pulmonary Function Pattern Pulmonary function tests show a nonspecific pattern with abnormal FEV1 and FVC but normal TLC. This could potentially be an early sign of some abnormality occurring within the lung. -Order a high-resolution CT chest scan to further investigate the lung tissue. -Consider repeating the pulmonary function test in a year to monitor for any changes in the pattern.  Inhaler Use Patient reports initial improvement in cough with inhaler use, but the benefit seems to have plateaued. -Discontinue inhaler and monitor for any increase in cough or shortness of breath.  Muscle Weakness and Limited Range of Motion Patient reports limited range of motion in the left arm and muscle weakness in both arms. This could potentially be related to previous trauma and subsequent scarring. -Continue current physical therapy regimen including weight lifting, cardio, and deep stretches. -Continue medical massage to help loosen scar tissue.  Follow-up in 1 month to discuss results of the CT chest scan.   Melody Comas, MD  Pulmonary & Critical Care Office: [phone]   See Amion for personal pager PCCM on call pager [phone] until 7pm. Please call Elink 7p-7a. [phone]   Current Outpatient Medications:  .  acetaminophen (TYLENOL) 325 MG tablet, Take 1-2 tablets (325-650 mg total) by mouth every 4 (four)  hours as needed for mild pain., Disp: , Rfl:  .  apixaban (ELIQUIS) 5 MG TABS tablet, Take 1 tablet (5 mg total) by mouth 2 (two) times daily., Disp: 60 tablet, Rfl: 11 .  Biotin 5000 MCG TABS, Take 5,000 mcg by mouth daily., Disp: , Rfl:  .  Cholecalciferol (VITAMIN D3) 50 MCG (2000 UT) CAPS, Take 4,000 Units by mouth daily., Disp: , Rfl:  .  flecainide (TAMBOCOR) 100 MG tablet, TAKE 1 TABLET(100 MG) BY MOUTH TWICE DAILY, Disp: 180 tablet, Rfl: 3 .  fluticasone-salmeterol (ADVAIR HFA) 115-21 MCG/ACT inhaler, Inhale 2 puffs into the lungs 2 (two) times daily., Disp: 1 each, Rfl: 12 .  latanoprost (XALATAN) 0.005 % ophthalmic solution, Place 1 drop into both eyes at bedtime., Disp: , Rfl:  .  magnesium gluconate (MAGONATE) 500 MG tablet, Take 0.5 tablets (250 mg total) by mouth at bedtime., Disp: 30 tablet, Rfl: 0 .  metoprolol succinate (TOPROL-XL) 25 MG 24 hr tablet, Take 0.5 tablets (12.5 mg total) by mouth 2 (two) times daily., Disp: 90 tablet, Rfl: 3  Current Facility-Administered Medications:  .  sodium chloride (PF) 0.9 % injection 2 mL, 2 mL, Intravenous, Daily, Raulkar, Drema Pry, MD, 2 mL at 12/08/22 1024

## 2023-07-30 NOTE — Patient Instructions (Addendum)
Resume Advair HFA 115-89mcg 2 puffs twice daily - rinse mouth out after each use  Follow up in 1 year for repeat breathing tests and CT Chest scan

## 2023-07-31 ENCOUNTER — Encounter: Payer: Self-pay | Admitting: Pulmonary Disease

## 2023-07-31 ENCOUNTER — Telehealth (HOSPITAL_COMMUNITY): Payer: Self-pay | Admitting: *Deleted

## 2023-07-31 NOTE — Telephone Encounter (Signed)
Reaching out to patient to offer assistance regarding upcoming cardiac imaging study; pt verbalizes understanding of appt date/time, parking situation and where to check in, pre-test NPO status and medications ordered, and verified current allergies; name and call back number provided for further questions should they arise Hayley Sharpe RN Navigator Cardiac Imaging [facility] [phone] office [phone] cell  

## 2023-08-01 ENCOUNTER — Ambulatory Visit (HOSPITAL_COMMUNITY)
Admission: RE | Admit: 2023-08-01 | Discharge: 2023-08-01 | Disposition: A | Discharge status: Home / Self Care | Payer: BC Managed Care – PPO | Source: Ambulatory Visit | Attending: Cardiovascular Disease | Admitting: Cardiovascular Disease

## 2023-08-01 ENCOUNTER — Ambulatory Visit (HOSPITAL_COMMUNITY)
Admission: RE | Admit: 2023-08-01 | Discharge: 2023-08-01 | Disposition: A | Discharge status: Home / Self Care | Payer: BC Managed Care – PPO | Source: Ambulatory Visit | Attending: Physician Assistant | Admitting: Physician Assistant

## 2023-08-01 ENCOUNTER — Other Ambulatory Visit: Payer: Self-pay | Admitting: Physician Assistant

## 2023-08-01 DIAGNOSIS — I4891 Unspecified atrial fibrillation: Secondary | ICD-10-CM | POA: Diagnosis not present

## 2023-08-01 DIAGNOSIS — I878 Other specified disorders of veins: Secondary | ICD-10-CM

## 2023-08-01 DIAGNOSIS — Z452 Encounter for adjustment and management of vascular access device: Secondary | ICD-10-CM | POA: Diagnosis not present

## 2023-08-01 DIAGNOSIS — I480 Paroxysmal atrial fibrillation: Secondary | ICD-10-CM | POA: Insufficient documentation

## 2023-08-01 MED ORDER — HEPARIN SOD (PORK) LOCK FLUSH 100 UNIT/ML IV SOLN
INTRAVENOUS | Status: AC
  Filled 2023-08-01: qty 5

## 2023-08-01 MED ORDER — IOHEXOL 350 MG/ML SOLN
80.0000 mL | Freq: Once | INTRAVENOUS | Status: AC | PRN
  Administered 2023-08-01: 80 mL via INTRAVENOUS

## 2023-08-01 MED ORDER — LIDOCAINE HCL 1 % IJ SOLN
INTRAMUSCULAR | Status: AC
  Filled 2023-08-01: qty 20

## 2023-08-01 MED ORDER — LIDOCAINE HCL 1 % IJ SOLN
10.0000 mL | Freq: Once | INTRAMUSCULAR | Status: AC
  Administered 2023-08-01: 10 mL via INTRADERMAL

## 2023-08-01 NOTE — Procedures (Signed)
  After several failed attempts at peripheral IV placement, decision was make to place a PICC for patient's scheduled CT heart and she will keep it in place for procedure next week.  PROCEDURE SUMMARY:  Successful placement of a single lumen PICC line to left basilic vein. Length 43cm Tip at lower SVC/RA No complications PICC capped Ready for use. EBL = trace  Please see full dictation in Imaging section for details.   Gwynneth Macleod PA-C 08/01/2023 12:53 PM

## 2023-08-01 NOTE — Progress Notes (Signed)
  Evaluation after Contrast Extravasation  Patient seen and examined immediately after contrast extravasation while in CT.  Exam: There is moderate swelling at the [facility].  There is no erythema. There is mild bluish discoloration. There are no blisters. There are no signs of decreased perfusion of the skin.  It is not warm to touch.  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  Radial pulse is normal.     Per contrast extravasation protocol, I have instructed the patient to keep an ice pack on the area for 20-60 minutes at a time for about 48 hours.   Keep arm elevated as much as possible.   The patient understands to call the radiology department if there is: - increase in pain or swelling - changed or altered sensation - ulceration or blistering - increasing redness - warmth or increasing firmness - decreased tissue perfusion as noted by decreased capillary refill or discoloration of skin - decreased pulses peripheral to site   [facility] Nahom Carfagno PA-C 08/01/2023 10:34 AM

## 2023-08-02 ENCOUNTER — Telehealth: Payer: Self-pay | Admitting: Pulmonary Disease

## 2023-08-02 NOTE — Telephone Encounter (Signed)
Pt calling in bc her Advair is costing her $300 and when she called her insurance there are 3 other similar formulary   -Budesonide     -Wixela  -Breo    Pt has a week supply left of her Advair

## 2023-08-03 NOTE — Telephone Encounter (Signed)
Called the pt She has 5 days left on her advair hfa  Needs alternative- breo, wixela or budesonide are covered  Please advise, thanks

## 2023-08-07 MED ORDER — FLUTICASONE FUROATE-VILANTEROL 200-25 MCG/ACT IN AEPB: 1.0000 | INHALATION_SPRAY | Freq: Every day | RESPIRATORY_TRACT | 5 refills | Status: DC

## 2023-08-07 NOTE — Anesthesia Preprocedure Evaluation (Signed)
Anesthesia Evaluation  Patient identified by MRN, date of birth, ID band Patient awake    Reviewed: Allergy & Precautions, NPO status , Patient's Chart, lab work & pertinent test results, reviewed documented beta blocker date and time   History of Anesthesia Complications Negative for: history of anesthetic complications  Airway Mallampati: II  TM Distance: >3 FB Neck ROM: Full    Dental  (+) Dental Advisory Given, Teeth Intact   Pulmonary COPD,  COPD inhaler   breath sounds clear to auscultation       Cardiovascular hypertension, Pt. on medications and Pt. on home beta blockers (-) angina + dysrhythmias Atrial Fibrillation  Rhythm:Irregular Rate:Normal  '23 ECHO: EF 65-70%, normal LVF, normal RVF, no significant valvular abnormalities   Neuro/Psych negative neurological ROS     GI/Hepatic negative GI ROS, Neg liver ROS,,,  Endo/Other  negative endocrine ROS    Renal/GU negative Renal ROS     Musculoskeletal   Abdominal   Peds  Hematology eliquis   Anesthesia Other Findings   Reproductive/Obstetrics                             Anesthesia Physical Anesthesia Plan  ASA: 3  Anesthesia Plan: General   Post-op Pain Management: Tylenol PO (pre-op)*   Induction: Intravenous  PONV Risk Score and Plan: 3 and Dexamethasone and Scopolamine patch - Pre-op  Airway Management Planned: Oral ETT  Additional Equipment: None  Intra-op Plan:   Post-operative Plan: Extubation in OR  Informed Consent: I have reviewed the patients History and Physical, chart, labs and discussed the procedure including the risks, benefits and alternatives for the proposed anesthesia with the patient or authorized representative who has indicated his/her understanding and acceptance.     Dental advisory given  Plan Discussed with: CRNA and Surgeon  Anesthesia Plan Comments:         Anesthesia Quick  Evaluation

## 2023-08-07 NOTE — Telephone Encounter (Signed)
Patient would like a call when the prescription has been called in. She is having heart surgery tomorrow on the 18th and will need someone to take care of her. She cannot be left alone for someone to go pick up the prescription so she will need her refill sooner than later.

## 2023-08-07 NOTE — Telephone Encounter (Signed)
Called and spoke with patient, advised that Virgel Bouquet had been sent to her pharmacy.  I walked her through how to use the inhaler as she was on Advair HFA.  She verbalized understanding.  Nothing further needed.

## 2023-08-07 NOTE — Telephone Encounter (Signed)
I have sent in [facility] which is alternative to Advair

## 2023-08-07 NOTE — Telephone Encounter (Signed)
Patient is calling on a update of her prescription. Patient is out of her current inhaler and needs the alternatives.  Pharmacy: [facility] on [address]

## 2023-08-07 NOTE — Pre-Procedure Instructions (Signed)
Instructed patient on the following items: Arrival time 0600 Nothing to eat or drink after midnight No meds AM of procedure Responsible person to drive you home and stay with you for 24 hrs  Have you missed any doses of anti-coagulant Eliquis- takes twice a day, hasn't missed any doses.  Don't take dose in the morning.

## 2023-08-08 ENCOUNTER — Encounter (HOSPITAL_COMMUNITY)
Admission: RE | Disposition: A | Discharge status: Home / Self Care | Payer: BC Managed Care – PPO | Source: Home / Self Care | Attending: Cardiovascular Disease

## 2023-08-08 ENCOUNTER — Ambulatory Visit (HOSPITAL_COMMUNITY)
Admission: RE | Admit: 2023-08-08 | Discharge: 2023-08-08 | Disposition: A | Discharge status: Home / Self Care | Payer: BC Managed Care – PPO | Attending: Cardiovascular Disease | Admitting: Cardiovascular Disease

## 2023-08-08 ENCOUNTER — Ambulatory Visit (HOSPITAL_COMMUNITY): Payer: Self-pay | Admitting: Anesthesiology

## 2023-08-08 ENCOUNTER — Ambulatory Visit (HOSPITAL_BASED_OUTPATIENT_CLINIC_OR_DEPARTMENT_OTHER): Payer: BC Managed Care – PPO

## 2023-08-08 ENCOUNTER — Other Ambulatory Visit: Payer: Self-pay

## 2023-08-08 ENCOUNTER — Ambulatory Visit (HOSPITAL_COMMUNITY): Payer: BC Managed Care – PPO

## 2023-08-08 DIAGNOSIS — Z79899 Other long term (current) drug therapy: Secondary | ICD-10-CM | POA: Insufficient documentation

## 2023-08-08 DIAGNOSIS — M79605 Pain in left leg: Secondary | ICD-10-CM | POA: Diagnosis not present

## 2023-08-08 DIAGNOSIS — I480 Paroxysmal atrial fibrillation: Secondary | ICD-10-CM | POA: Diagnosis not present

## 2023-08-08 DIAGNOSIS — I10 Essential (primary) hypertension: Secondary | ICD-10-CM | POA: Diagnosis not present

## 2023-08-08 DIAGNOSIS — J449 Chronic obstructive pulmonary disease, unspecified: Secondary | ICD-10-CM | POA: Insufficient documentation

## 2023-08-08 DIAGNOSIS — I495 Sick sinus syndrome: Secondary | ICD-10-CM | POA: Insufficient documentation

## 2023-08-08 HISTORY — PX: ATRIAL FIBRILLATION ABLATION: EP1191

## 2023-08-08 LAB — POCT ACTIVATED CLOTTING TIME: Activated Clotting Time: 371 s

## 2023-08-08 SURGERY — ATRIAL FIBRILLATION ABLATION
Anesthesia: General

## 2023-08-08 MED ORDER — FENTANYL CITRATE (PF) 100 MCG/2ML IJ SOLN
25.0000 ug | INTRAMUSCULAR | Status: DC | PRN
  Administered 2023-08-08 (×2): 25 ug via INTRAVENOUS

## 2023-08-08 MED ORDER — ACETAMINOPHEN 325 MG PO TABS: 650.0000 mg | ORAL_TABLET | ORAL | Status: DC | PRN

## 2023-08-08 MED ORDER — HEPARIN SODIUM (PORCINE) 1000 UNIT/ML IJ SOLN
INTRAMUSCULAR | Status: DC | PRN
  Administered 2023-08-08: 16000 [IU] via INTRAVENOUS

## 2023-08-08 MED ORDER — PROTAMINE SULFATE 10 MG/ML IV SOLN
INTRAVENOUS | Status: DC | PRN
  Administered 2023-08-08: 5 mg via INTRAVENOUS
  Administered 2023-08-08: 10 mg via INTRAVENOUS
  Administered 2023-08-08: 5 mg via INTRAVENOUS
  Administered 2023-08-08: 10 mg via INTRAVENOUS
  Administered 2023-08-08: 5 mg via INTRAVENOUS
  Administered 2023-08-08: 15 mg via INTRAVENOUS

## 2023-08-08 MED ORDER — ACETAMINOPHEN 500 MG PO TABS
ORAL_TABLET | ORAL | Status: AC
  Filled 2023-08-08: qty 2

## 2023-08-08 MED ORDER — SODIUM CHLORIDE 0.9% FLUSH: 3.0000 mL | Freq: Two times a day (BID) | INTRAVENOUS | Status: DC

## 2023-08-08 MED ORDER — ACETAMINOPHEN 500 MG PO TABS
1000.0000 mg | ORAL_TABLET | Freq: Once | ORAL | Status: AC
  Administered 2023-08-08: 1000 mg via ORAL

## 2023-08-08 MED ORDER — FENTANYL CITRATE (PF) 100 MCG/2ML IJ SOLN
INTRAMUSCULAR | Status: AC
  Filled 2023-08-08: qty 2

## 2023-08-08 MED ORDER — SODIUM CHLORIDE 0.9 % IV SOLN
250.0000 mL | INTRAVENOUS | Status: DC | PRN
Start: 2023-08-08 — End: 2023-08-08

## 2023-08-08 MED ORDER — ATROPINE SULFATE 1 MG/ML IV SOLN
INTRAVENOUS | Status: DC | PRN
  Administered 2023-08-08: 1 mg via INTRAVENOUS

## 2023-08-08 MED ORDER — PHENYLEPHRINE 80 MCG/ML (10ML) SYRINGE FOR IV PUSH (FOR BLOOD PRESSURE SUPPORT)
PREFILLED_SYRINGE | INTRAVENOUS | Status: AC
  Filled 2023-08-08: qty 30

## 2023-08-08 MED ORDER — SODIUM CHLORIDE 0.9 % IV SOLN: INTRAVENOUS | Status: DC

## 2023-08-08 MED ORDER — ROCURONIUM BROMIDE 10 MG/ML (PF) SYRINGE
PREFILLED_SYRINGE | INTRAVENOUS | Status: DC | PRN
  Administered 2023-08-08: 70 mg via INTRAVENOUS
  Administered 2023-08-08: 20 mg via INTRAVENOUS
  Administered 2023-08-08: 10 mg via INTRAVENOUS

## 2023-08-08 MED ORDER — SCOPOLAMINE 1 MG/3DAYS TD PT72: 1.0000 | MEDICATED_PATCH | TRANSDERMAL | Status: DC

## 2023-08-08 MED ORDER — ONDANSETRON HCL 4 MG/2ML IJ SOLN: 4.0000 mg | Freq: Four times a day (QID) | INTRAMUSCULAR | Status: DC | PRN

## 2023-08-08 MED ORDER — SUGAMMADEX SODIUM 200 MG/2ML IV SOLN
INTRAVENOUS | Status: DC | PRN
  Administered 2023-08-08 (×2): 100 mg via INTRAVENOUS

## 2023-08-08 MED ORDER — DEXAMETHASONE SODIUM PHOSPHATE 10 MG/ML IJ SOLN
INTRAMUSCULAR | Status: DC | PRN
  Administered 2023-08-08: 5 mg via INTRAVENOUS

## 2023-08-08 MED ORDER — PHENYLEPHRINE 80 MCG/ML (10ML) SYRINGE FOR IV PUSH (FOR BLOOD PRESSURE SUPPORT)
PREFILLED_SYRINGE | INTRAVENOUS | Status: DC | PRN
  Administered 2023-08-08: 40 ug via INTRAVENOUS
  Administered 2023-08-08: 80 ug via INTRAVENOUS

## 2023-08-08 MED ORDER — CHLORHEXIDINE GLUCONATE CLOTH 2 % EX PADS: 6.0000 | MEDICATED_PAD | Freq: Every day | CUTANEOUS | Status: DC

## 2023-08-08 MED ORDER — SODIUM CHLORIDE 0.9% FLUSH: 10.0000 mL | Freq: Two times a day (BID) | INTRAVENOUS | Status: DC

## 2023-08-08 MED ORDER — LIDOCAINE 2% (20 MG/ML) 5 ML SYRINGE
INTRAMUSCULAR | Status: DC | PRN
  Administered 2023-08-08: 40 mg via INTRAVENOUS

## 2023-08-08 MED ORDER — HEPARIN (PORCINE) IN NACL 1000-0.9 UT/500ML-% IV SOLN
INTRAVENOUS | Status: DC | PRN
  Administered 2023-08-08 (×3): 500 mL

## 2023-08-08 MED ORDER — PROPOFOL 10 MG/ML IV BOLUS
INTRAVENOUS | Status: DC | PRN
  Administered 2023-08-08: 150 mg via INTRAVENOUS
  Administered 2023-08-08: 50 mg via INTRAVENOUS

## 2023-08-08 MED ORDER — ONDANSETRON HCL 4 MG/2ML IJ SOLN
INTRAMUSCULAR | Status: DC | PRN
  Administered 2023-08-08: 4 mg via INTRAVENOUS

## 2023-08-08 MED ORDER — SODIUM CHLORIDE 0.9% FLUSH: 3.0000 mL | INTRAVENOUS | Status: DC | PRN

## 2023-08-08 MED ORDER — SODIUM CHLORIDE 0.9% FLUSH: 10.0000 mL | INTRAVENOUS | Status: DC | PRN

## 2023-08-08 MED ORDER — EPHEDRINE SULFATE-NACL 50-0.9 MG/10ML-% IV SOSY
PREFILLED_SYRINGE | INTRAVENOUS | Status: DC | PRN
  Administered 2023-08-08: 5 mg via INTRAVENOUS

## 2023-08-08 SURGICAL SUPPLY — 20 items
BLANKET WARM UNDERBOD FULL ACC (MISCELLANEOUS) ×2 IMPLANT
CABLE PFA RX CATH CONN (CABLE) IMPLANT
CATH 8FR REPROCESSED SOUNDSTAR (CATHETERS) ×1
CATH 8FR SOUNDSTAR REPROCESSED (CATHETERS) IMPLANT
CATH FARAWAVE ABLATION 31 (CATHETERS) IMPLANT
CATH OCTARAY 2.0 F 3-3-3-3-3 (CATHETERS) IMPLANT
CATH WEBSTER BI DIR CS D-F CRV (CATHETERS) IMPLANT
CLOSURE PERCLOSE PROSTYLE (VASCULAR PRODUCTS) IMPLANT
COVER SWIFTLINK CONNECTOR (BAG) ×2 IMPLANT
DEVICE CLOSURE MYNXGRIP 6/7F (Vascular Products) IMPLANT
DILATOR VESSEL 38 20CM 16FR (INTRODUCER) IMPLANT
GUIDEWIRE INQWIRE 1.5J.035X260 (WIRE) IMPLANT
INQWIRE 1.5J .035X260CM (WIRE) ×1
KIT VERSACROSS CNCT FARADRIVE (KITS) IMPLANT
PACK EP LF (CUSTOM PROCEDURE TRAY) ×2 IMPLANT
PAD DEFIB RADIO PHYSIO CONN (PAD) ×2 IMPLANT
PATCH CARTO3 (PAD) IMPLANT
SHEATH FARADRIVE STEERABLE (SHEATH) IMPLANT
SHEATH PINNACLE 8F 10CM (SHEATH) IMPLANT
SHEATH PINNACLE 9F 10CM (SHEATH) IMPLANT

## 2023-08-08 NOTE — Progress Notes (Signed)
Continues to C/O pain L groin, fullness noted, moe pain toward bladder, very tender to touch everywhere.  Dr Nelly Laurence in again to assess.  [place] ordered.  Manual pressure held again, fentanyl repeated 25 mcgs.  Pure wick placed.

## 2023-08-08 NOTE — Transfer of Care (Signed)
Immediate Anesthesia Transfer of Care Note  Patient: Terri Drake  Procedure(s) Performed: ATRIAL FIBRILLATION ABLATION  Patient Location: PACU  Anesthesia Type:General  Level of Consciousness: awake, alert , oriented, and patient cooperative  Airway & Oxygen Therapy: Patient Spontanous Breathing and Patient connected to face mask oxygen  Post-op Assessment: Report given to RN and Post -op Vital signs reviewed and stable  Post vital signs: Reviewed and stable  Last Vitals:  Vitals Value Taken Time  BP    Temp    Pulse 70 08/08/23 0956  Resp 13 08/08/23 0956  SpO2 100 % 08/08/23 0956  Vitals shown include unfiled device data.  Last Pain:  Vitals:   08/08/23 0638  TempSrc:   PainSc: 4       Patients Stated Pain Goal: 3 (08/08/23 9528)  Complications: No notable events documented.

## 2023-08-08 NOTE — Progress Notes (Signed)
LA SL PICC removed per protocol per MD order. Manual pressure applied for 5 mins. Vaseline gauze, gauze, and Tegaderm applied over insertion site. No bleeding or swelling noted. Instructed patient to remain in bed for thirty mins. Educated patient about S/S of infection and when to call MD; no heavy lifting or pressure on left side for 24 hours; keep dressing dry and intact for 24 hours. Pt verbalized comprehension.

## 2023-08-08 NOTE — Anesthesia Postprocedure Evaluation (Signed)
Anesthesia Post Note  Patient: Terri Drake  Procedure(s) Performed: ATRIAL FIBRILLATION ABLATION     Patient location during evaluation: Cath Lab Anesthesia Type: General Level of consciousness: awake and alert, patient cooperative and oriented Pain management: pain level controlled Vital Signs Assessment: post-procedure vital signs reviewed and stable Respiratory status: spontaneous breathing, nonlabored ventilation and respiratory function stable Cardiovascular status: blood pressure returned to baseline and stable Postop Assessment: no apparent nausea or vomiting and adequate PO intake Anesthetic complications: no   No notable events documented.  Last Vitals:  Vitals:   08/08/23 1100 08/08/23 1115  BP: 125/70 134/67  Pulse: 65 62  Resp: 17 13  Temp:    SpO2: 97% 98%    Last Pain:  Vitals:   08/08/23 1130  TempSrc:   PainSc: 5                  Rayonna Heldman,E. Malachai Schalk

## 2023-08-08 NOTE — Discharge Instructions (Signed)

## 2023-08-08 NOTE — Addendum Note (Signed)
Addendum  created 08/08/23 1158 by Wilder Glade, CRNA   Intraprocedure Meds edited

## 2023-08-08 NOTE — Progress Notes (Signed)
Vascular lab in, no hematoma or pseuodaneurysm per tech.  Still extremely tender to touch.  Transfer to SS.

## 2023-08-08 NOTE — Interval H&P Note (Signed)
History and Physical Interval Note:  08/08/2023 7:21 AM  Terri Drake  has presented today for surgery, with the diagnosis of afib.  The various methods of treatment have been discussed with the patient and family. After consideration of risks, benefits and other options for treatment, the patient has consented to  Procedure(s): ATRIAL FIBRILLATION ABLATION (N/A) as a surgical intervention.  The patient's history has been reviewed, patient examined, no change in status, stable for surgery.  I have reviewed the patient's chart and labs.  Questions were answered to the patient's satisfaction.    I reviewed the patient's CT and labs. There was no LAA thrombus. she  has not missed any doses of anticoagulation, and she took her dose last night. I reviewed the notes since our last clinic visit, including from her visit with pulmonology and I have noted those changes.  Roberts Gaudy Maximillion Gill

## 2023-08-08 NOTE — Progress Notes (Addendum)
L femoral vein site with soft hematoma, manual pressure held  x 15 minutes.  Dr Nelly Laurence in to review. Fentanyl 25 mcg given for L groin pain and generalized pain from old injury.

## 2023-08-08 NOTE — Anesthesia Procedure Notes (Signed)
Procedure Name: Intubation Date/Time: 08/08/2023 8:37 AM  Performed by: Wilder Glade, CRNAPre-anesthesia Checklist: Patient identified, Emergency Drugs available, Suction available, Patient being monitored and Timeout performed Patient Re-evaluated:Patient Re-evaluated prior to induction Oxygen Delivery Method: Circle system utilized Preoxygenation: Pre-oxygenation with 100% oxygen Induction Type: IV induction Ventilation: Mask ventilation without difficulty Laryngoscope Size: Miller and 2 Grade View: Grade I Tube type: Oral Tube size: 7.0 mm Number of attempts: 1 Airway Equipment and Method: Stylet Placement Confirmation: ETT inserted through vocal cords under direct vision, positive ETCO2 and breath sounds checked- equal and bilateral Secured at: 23 cm Tube secured with: Tape Dental Injury: Teeth and Oropharynx as per pre-operative assessment  Comments: Brief atraumatic dentition unchanged smooth

## 2023-08-09 ENCOUNTER — Encounter (HOSPITAL_COMMUNITY): Payer: Self-pay | Admitting: Cardiovascular Disease

## 2023-08-13 ENCOUNTER — Encounter: Payer: Self-pay | Admitting: Cardiovascular Disease

## 2023-08-13 NOTE — Telephone Encounter (Signed)
Attempted to contact patient, left message to call our office back if needed

## 2023-08-18 IMAGING — MR MR SHOULDER*L* W/O CM
4 of 5 series · 18 of 40 positions shown · non-contrast
Comparison: X-ray 12/27/2021

CLINICAL DATA: Left shoulder pain.  Known left scapular fracture

EXAM:
MRI OF THE LEFT SHOULDER WITHOUT CONTRAST
TECHNIQUE: Multiplanar, multisequence MR imaging of the shoulder was performed.
No intravenous contrast was administered.

[Series 2: T2 fat-sat · axial · 4.0mm · 0.35mm/px · z∈[-65,+50]mm · 6 of 29 slices shown (1 of 3)]
[im 1/29]
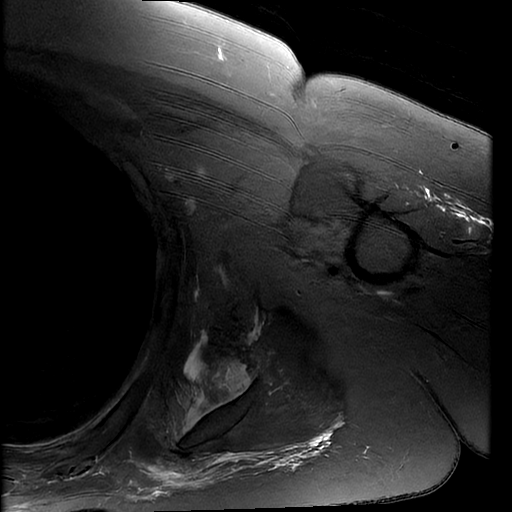
[im 5/29]
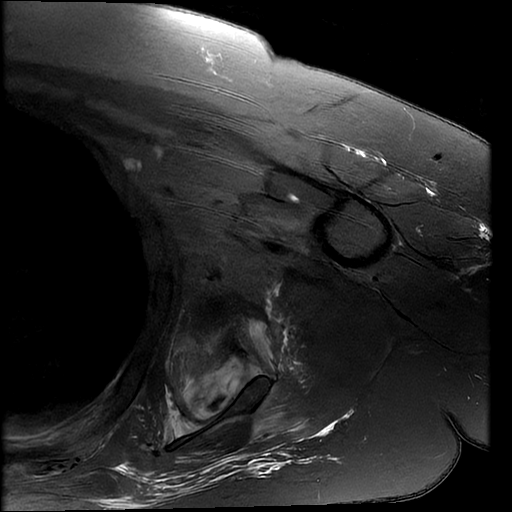
[im 9/29]
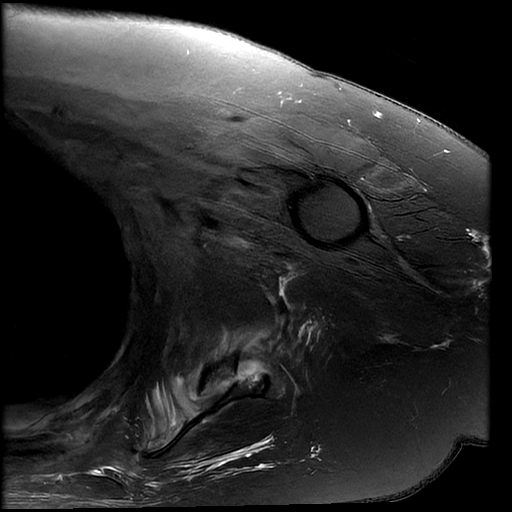
[im 13/29]
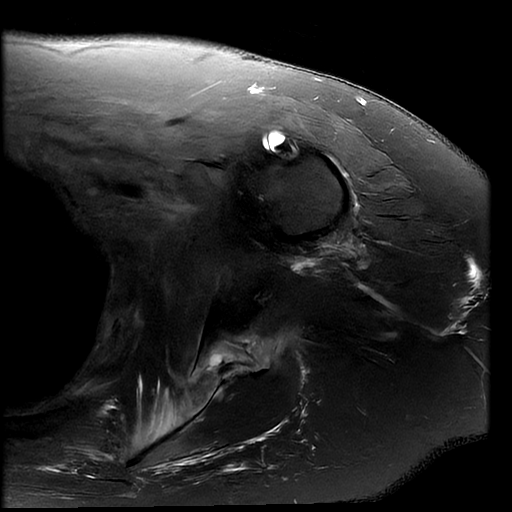
[im 17/29]
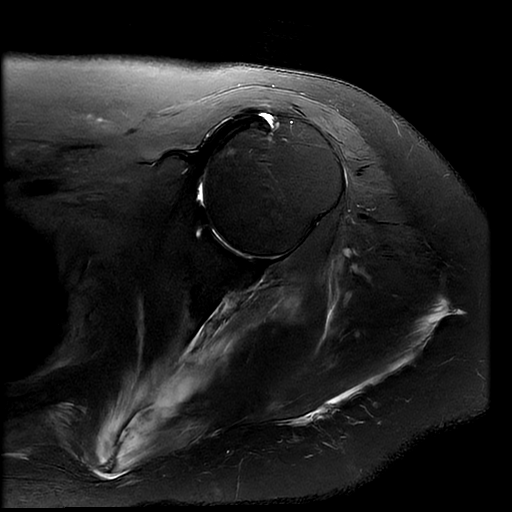
[im 25/29]
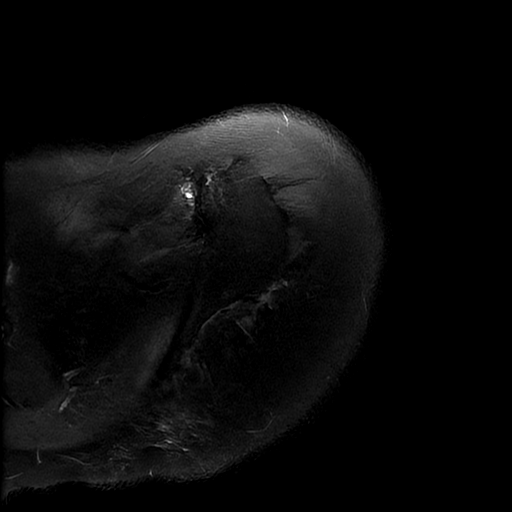

[Series 3: T2 fat-sat · sagittal · 4.0mm · 0.27mm/px · 3 of 21 slices shown (2 of 3)]
[im 5/21]
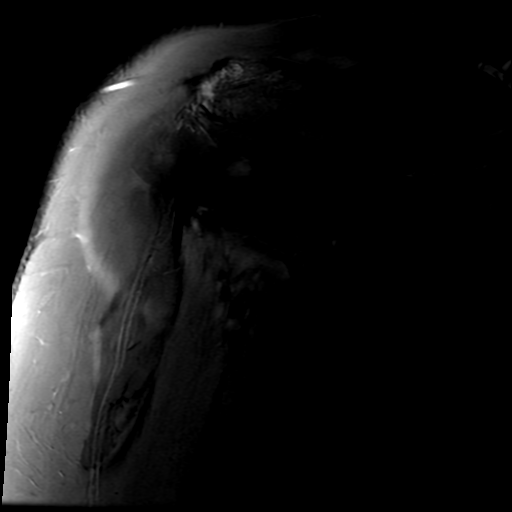
[im 13/21]
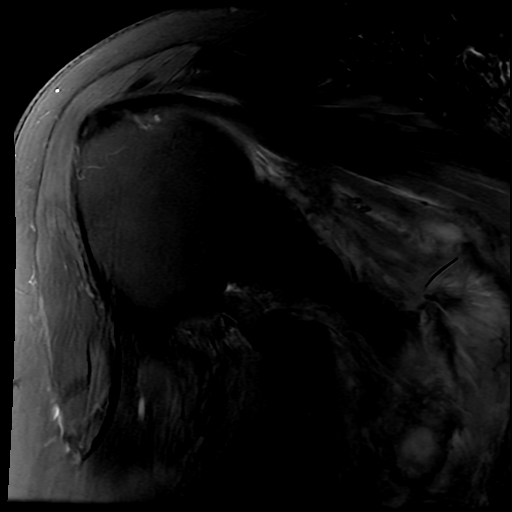
[im 21/21]
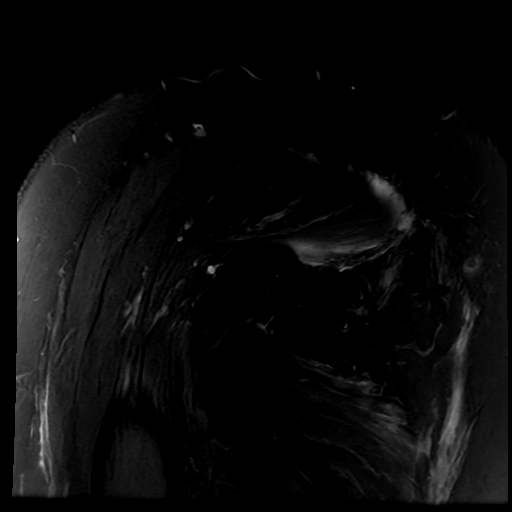

[Series 4: PD fat-sat · sagittal · 4.0mm · 0.27mm/px · 6 of 21 slices shown]
[im 1/21]
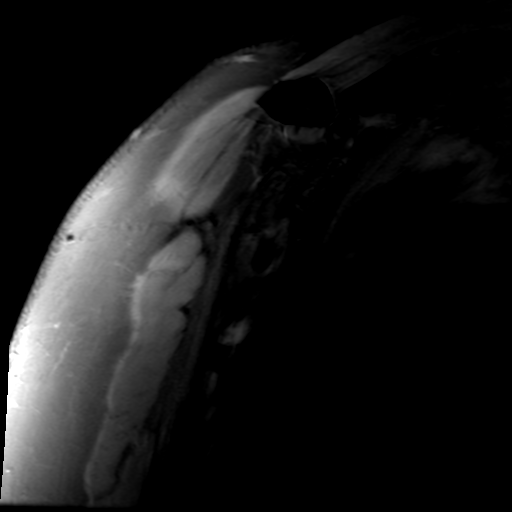
[im 5/21]
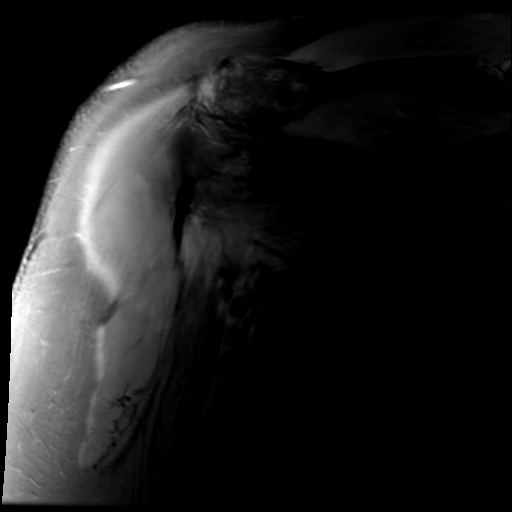
[im 9/21]
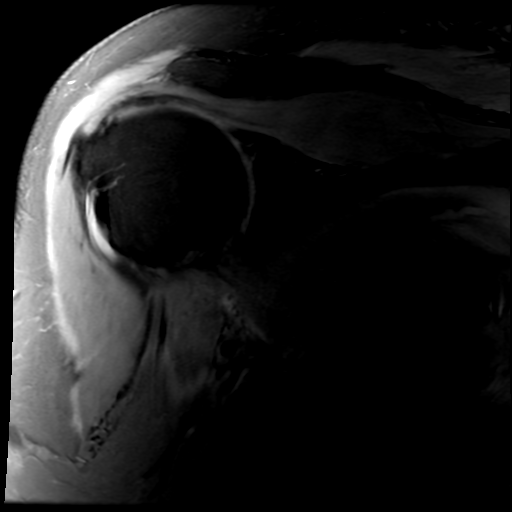
[im 13/21]
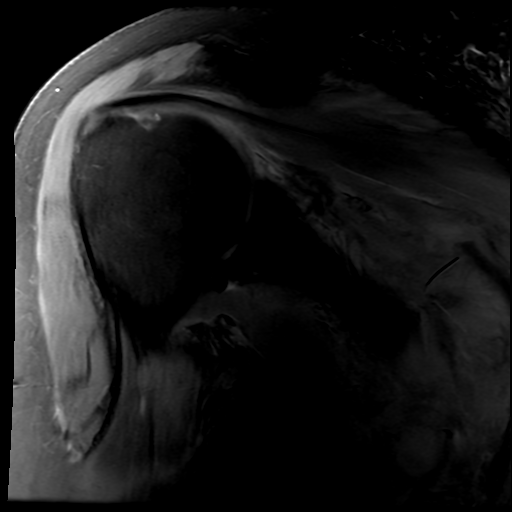
[im 17/21]
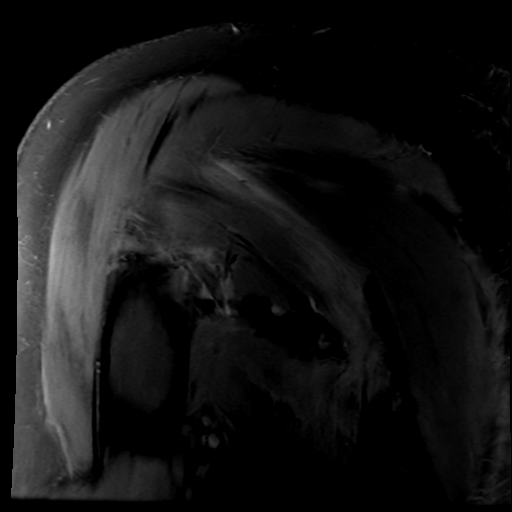
[im 21/21]
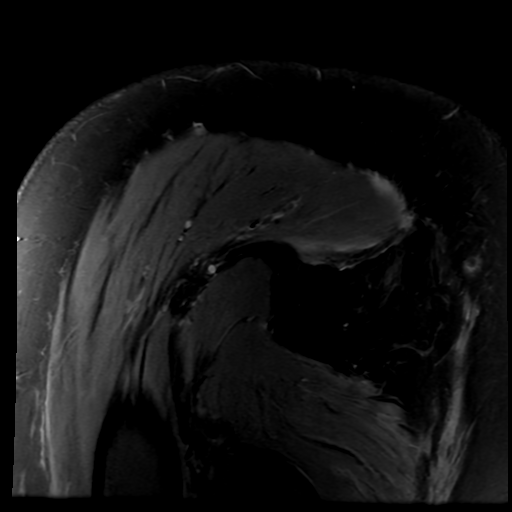

[Series 5: T2 fat-sat · coronal · 4.0mm · 0.35mm/px · 3 of 34 slices shown (3 of 3)]
[im 4/34]
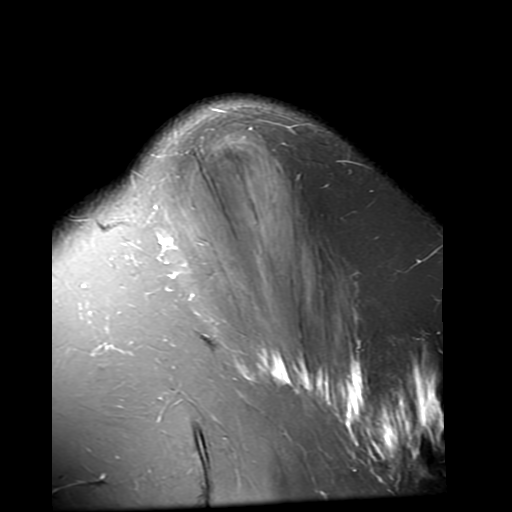
[im 19/34]
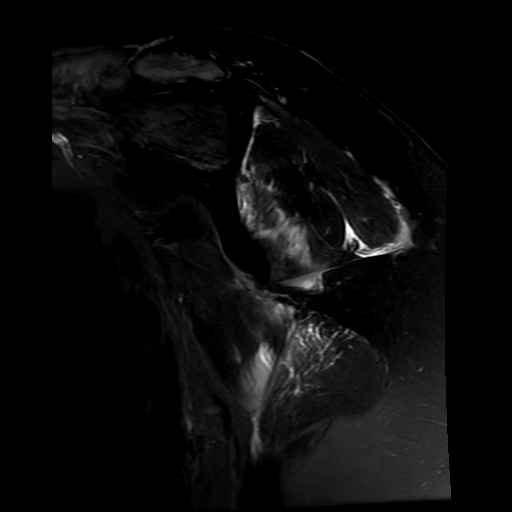
[im 30/34]
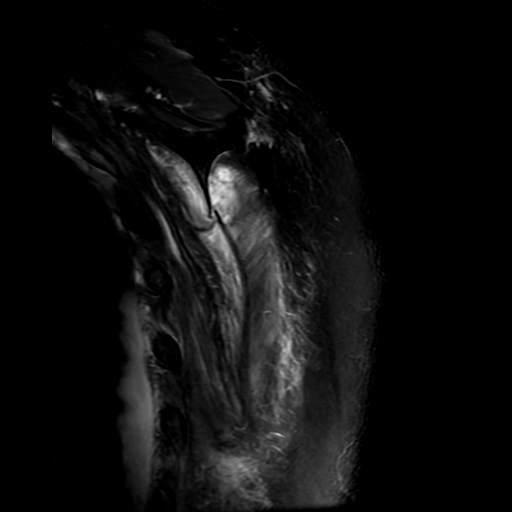

[18 of 40 positions shown; findings below may reference images not displayed]

FINDINGS: Rotator cuff: The supraspinatus, infraspinatus, subscapularis, and
teres minor tendons are intact.

Muscles: Prominent intramuscular edema within the subscapularis and
infraspinatus muscles adjacent to the scapular fracture site as well
as within the teres major muscle. Areas of distortion of the normal
muscle architecture with small intramuscular fluid collections
compatible with traumatic muscle tears and multiple small
intramuscular hematomas. For reference, two subcentimeter
intramuscular hematomas are seen within the infraspinatus muscle
belly (series 5, images 4 and 9). No muscle atrophy or fatty
infiltration.

Biceps long head: Intact and appropriately positioned. Small amount
of fluid in the biceps tendon sheath.

Acromioclavicular Joint: Mild-moderate degenerative changes of the
AC joint. No significant subacromial-subdeltoid bursal fluid.

Glenohumeral Joint: No joint effusion. No cartilage defect.

Labrum: Grossly intact although evaluation is limited in the absence
of intra-articular fluid. No paralabral cyst.

Bones: Acute fracture involving the infraspinous aspect of the
scapular body with approximately 1.0 cm of posterior displacement.
No fracture involvement of the glenoid. Proximal humerus intact.
Glenohumeral joint alignment is maintained without dislocation. No
evidence of a underlying bone lesion.

Other: Prominent soft tissue edema surrounding the fracture site.
IMPRESSION: 1. Acute moderately displaced fracture of the left scapular body.
2. Prominent intramuscular edema surrounding the scapular fracture
site. Associated traumatic muscle tears with multiple small
intramuscular hematomas, most pronounced within the infraspinatus
and subscapularis muscles.
3. Intact rotator cuff.

## 2023-08-20 NOTE — Telephone Encounter (Signed)
Repeat K was done and was 4.9.Closing encounter.

## 2023-09-05 ENCOUNTER — Ambulatory Visit (HOSPITAL_COMMUNITY): Payer: BC Managed Care – PPO | Admitting: Internal Medicine

## 2023-09-06 NOTE — Progress Notes (Addendum)
Subjective:    Patient ID: Terri Drake, female    DOB: 1961/02/26, 63 y.o.   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 Carola Frost removed hardware -she was able to do her [place] trip in December! -getting manual therapy  4) Shortness of breath: -still experiencing  5) Atrial fibrillation: -had an ablation on 12/18 -off metoprolol and flecanide -heart rate 63 -has felt more energy as a result  6) Polytrauma:  -regressed after trip -restarted working with Kevin Fenton when he got back -had areas of inflammation after her trip in multiple muscles  7) Shortness of breath: -being treated with Virgel Bouquet -she has trouble with deeply inhaling and exhalation -saw pulmonology    Pain Inventory Average Pain 3 Pain Right Now 4 My pain is burning, stabbing, and tingling  In the last 24 hours, has pain interfered with the following? General activity 4 Relation with others 9 Enjoyment of life 8 What TIME of day is your pain at its worst? morning , daytime, evening, and night Sleep (in general) Fair  Pain is worse with: standing and some activites Pain improves with: heat/ice and therapy/exercise Relief from Meds: 0  Family History  Problem Relation Age of Onset   Heart disease Neg Hx        in either parent   Social History   Socioeconomic History   Marital status:  Married    Spouse name: Not on file   Number of children: Not on file   Years of education: Not on file   Highest education level: Not on file  Occupational History   Not on file  Tobacco Use   Smoking status: Never   Smokeless tobacco: Never   Tobacco comments:    Never smoke 11/04/21  Vaping Use   Vaping status: Never Used  Substance and Sexual Activity   Alcohol use: Yes    Alcohol/week: 5.0 - 10.0 standard drinks of alcohol    Types: 5 - 10 Glasses of wine per week    Comment: 1-2 glasses nightly as of 10/20/22   Drug use: Never   Sexual activity: Not on file  Other Topics Concern   Not on file  Social History Narrative   Not on file   Social Drivers of Health   Financial Resource Strain: Not on file  Food Insecurity: Not on file  Transportation Needs: Not on file  Physical Activity: Not on file  Stress: Not on file  Social Connections: Not on file   Past Surgical History:  Procedure Laterality Date   ATRIAL FIBRILLATION ABLATION N/A 08/08/2023   Procedure: ATRIAL FIBRILLATION ABLATION;  Surgeon: Maurice Small, MD;  Location: [facility];  Service: Cardiovascular;  Laterality: N/A;   HARDWARE REMOVAL Right 10/23/2022   Procedure: HARDWARE REMOVAL;  Surgeon: Myrene Galas, MD;  Location: [facility] OR;  Service: Orthopedics;  Laterality: Right;  ORIF ANKLE FRACTURE Right 12/29/2021   Procedure: OPEN REDUCTION INTERNAL FIXATION (ORIF) ANKLE FRACTURE;  Surgeon: Myrene Galas, MD;  Location: [facility] OR;  Service: Orthopedics;  Laterality: Right;   Past Surgical History:  Procedure Laterality Date   ATRIAL FIBRILLATION ABLATION N/A 08/08/2023   Procedure: ATRIAL FIBRILLATION ABLATION;  Surgeon: Maurice Small, MD;  Location: [facility];  Service: Cardiovascular;  Laterality: N/A;   HARDWARE REMOVAL Right 10/23/2022   Procedure: HARDWARE REMOVAL;  Surgeon: Myrene Galas, MD;  [facility] OR;  Service: Orthopedics;  Laterality: Right;   ORIF ANKLE FRACTURE  Right 12/29/2021   Procedure: OPEN REDUCTION INTERNAL FIXATION (ORIF) ANKLE FRACTURE;  Surgeon: Myrene Galas, MD;  Location: [facility] OR;  Service: Orthopedics;  Laterality: Right;   Past Medical History:  Diagnosis Date   Ankle dislocation, right, initial encounter 12/30/2021   Ankle syndesmosis disruption, right, initial encounter 12/30/2021   Arthritis    Closed displaced trimalleolar fracture of right ankle 12/30/2021   COVID 09/20/2022   Dyspnea    Dysrhythmia    a-fib   Neuromuscular disorder (HCC)    Paroxysmal atrial fibrillation (HCC)    Pneumonia    as a child   There were no vitals taken for this visit.  Opioid Risk Score:   Fall Risk Score:  `1  Depression screen [facility] 2/9     01/19/2023   10:10 AM 10/26/2022    9:32 AM 10/06/2022   11:26 AM 02/14/2022   11:00 AM  Depression screen PHQ 2/9  Decreased Interest 3 0 0 1  Down, Depressed, Hopeless 1 0 0 1  PHQ - 2 Score 4 0 0 2  Altered sleeping    3  Tired, decreased energy    2  Change in appetite    0  Feeling bad or failure about yourself     0  Trouble concentrating    1  Moving slowly or fidgety/restless    0  Suicidal thoughts    0  PHQ-9 Score    8  Difficult doing work/chores    Somewhat difficult      Review of Systems  Musculoskeletal:  Positive for back pain, gait problem and neck pain.  All other systems reviewed and are negative.      Objective:   Physical Exam  Gen: no distress, normal appearing, weight 186 l lbs, BMI 29.13, BP 138/69, pulse rate 63 HEENT: oral mucosa pink and moist, NCAT Cardio: Reg rate Chest: normal effort, normal rate of breathing Abd: soft, non-distended Ext: no edema Psych: pleasant, normal affect Skin: petechiae of ankle Neuro: Right foot range of motion is restricted in plantarflexion, muscle points tender Full range of motion in upper extremities Instability in ambulation.        Assessment & Plan:   1) Numbness and tingling in both hands: -discussed that  this is likely C8-T1 nerve root -MRI report reviewed.  -discussed that EMG/NCS scheduled Recommended methylated B vitamin  2) Right ankle pain: -Discussed Qutenza as an option for neuropathic pain control. Discussed that this is a capsaicin patch, stronger than capsaicin cream. Discussed that it is currently approved for diabetic peripheral neuropathy and post-herpetic neuralgia, but that it has also shown benefit in treating other forms of neuropathy. Provided patient with link to site to learn more about the patch: https://www.clark.biz/. Discussed that the patch would be placed in office and benefits usually last 3 months. Discussed that unintended exposure to capsaicin can cause severe irritation of eyes, mucous  membranes, respiratory tract, and skin, but that Qutenza is a local treatment and does not have the systemic side effects of other nerve medications. Discussed that there may be pain, itching, erythema, and decreased sensory function associated with the application of Qutenza. Side effects usually subside within 1 week. A cold pack of analgesic medications can help with these side effects. Blood pressure can also be increased due to pain associated with administration of the patch.   -discussed that she has reached maximal medical improvement  -continue exercise therapy CARS  -continue manual therapy  -continue heat  Discussed extracorporeal shockwave therapy as a modality for treatment. Discussed that the device looks and feels like a massage gun and I would move it over the area of pain for about 10 minutes. The device releases sound waves to the area of pain and helps to improve blood flow and circulation to improve the healing process. Discuss that this initially induces inflammation and can sometimes cause short-term increase in pain. Discussed that we typically do three weekly treatments, but sometimes up to 6 if needed, and after 6 weeks long term benefits can sometimes be  achieved. Discussed that this is an FDA approved device, but not covered by insurance and would cost $60 per session. Will scheduled patient for 6 consecutive appointments and can cancel latter three if benefits are achieved after first three sessions.    3) Polytrauma -discussed her return to work  4) Myalgia: -continue red light therapy  -continue heat  -continue therapy to break up scar tissues -continue tens unit -Prescribed Zynex Nexwave -continue massage therapy -continue tens unit -d/c robaxin  5) Overweight: -discussed her weight loss with exercise   6) Atrial fibrillation: -discussed decreased fatigue after ablation  7) Vitamin D deficiency: check vitamin D level with labs  8) Hyponatremia: -discussed that recent Na level is 134  9. Shortness of breath: -discussed pulmonology referral -discussed cardioablation December  -continue Breo  10) Cough: -continue inhaler -reviewed pulmonology note with her -discussed that her Sniff test was normal -discussed that the pulmonologist read every note and the CXR was repeat, reviewed and was stable -discussed that her mother has a history of pulmonary fibrosis -discussed that she took a walk yesterday and felt that she did very well.   11) Bradycardia: -discussed plan for cardiac ablation  12) Transaminitis: -discussed that she does not want tylenol  13) Elevated MCV: -discussed that this could be from a B12 deficiency -recommended methylated B12 and methylated

## 2023-09-07 ENCOUNTER — Encounter
Payer: No Typology Code available for payment source | Attending: Physical Medicine and Rehabilitation | Admitting: Physical Medicine and Rehabilitation

## 2023-09-07 VITALS — BP 139/69 | HR 65 | Ht 67.0 in | Wt 186.0 lb

## 2023-09-07 DIAGNOSIS — R7401 Elevation of levels of liver transaminase levels: Secondary | ICD-10-CM

## 2023-09-07 DIAGNOSIS — R718 Other abnormality of red blood cells: Secondary | ICD-10-CM | POA: Diagnosis present

## 2023-09-07 DIAGNOSIS — E663 Overweight: Secondary | ICD-10-CM

## 2023-09-07 DIAGNOSIS — R0602 Shortness of breath: Secondary | ICD-10-CM | POA: Diagnosis not present

## 2023-09-07 DIAGNOSIS — I480 Paroxysmal atrial fibrillation: Secondary | ICD-10-CM

## 2023-09-07 NOTE — Patient Instructions (Addendum)
Methylcobalamin, methylfolate B complex   Nattokinase- for Long Covid to treat microclots

## 2023-09-10 ENCOUNTER — Encounter (HOSPITAL_COMMUNITY): Payer: Self-pay | Admitting: Physician Assistant

## 2023-09-10 ENCOUNTER — Ambulatory Visit (HOSPITAL_COMMUNITY)
Admission: RE | Admit: 2023-09-10 | Discharge: 2023-09-10 | Disposition: A | Discharge status: Home / Self Care | Payer: 59 | Source: Ambulatory Visit | Attending: Physician Assistant | Admitting: Physician Assistant

## 2023-09-10 VITALS — BP 138/100 | HR 65 | Ht 67.0 in | Wt 186.0 lb

## 2023-09-10 DIAGNOSIS — I700 Atherosclerosis of aorta: Secondary | ICD-10-CM | POA: Diagnosis not present

## 2023-09-10 DIAGNOSIS — Z7901 Long term (current) use of anticoagulants: Secondary | ICD-10-CM | POA: Insufficient documentation

## 2023-09-10 DIAGNOSIS — I4819 Other persistent atrial fibrillation: Secondary | ICD-10-CM | POA: Insufficient documentation

## 2023-09-10 DIAGNOSIS — I2510 Atherosclerotic heart disease of native coronary artery without angina pectoris: Secondary | ICD-10-CM | POA: Diagnosis not present
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
## 2023-09-14 ENCOUNTER — Ambulatory Visit: Payer: BC Managed Care – PPO | Admitting: Physical Medicine and Rehabilitation

## 2023-11-04 NOTE — Progress Notes (Unsigned)
 Cardiology Office Note:  .   Date:  11/04/2023  ID:  Terri Drake, DOB 1961/02/13, MRN [id number] PCP: Terri Dimitri, MD  [facility] Providers Cardiologist:  Terri Sprague, MD (Inactive) Cardiology APP:  Terri Drake, [place]  Electrophysiologist:  Terri Small, MD {  History of Present Illness: .   Terri Drake is a 63 y.o. female w/PMHx of  AFib Aortic atherosclerosis  Referred to Dr. Nelly Drake for AFib management, last saw him Nov 2024, despite flecainide > continued to have Afib that was symptomatic despite rate control Felt to have failed drug (described tachy-brady with sinus rates in the 40's and fatigued) and planned for ablation  Ablation done 08/08/23  Saw the Afib clinic team 09/10/23, feeling well, better then she has in years, no symptoms of AFib   Today's visit is scheduled as a 3 mo post ablation visit  ROS:   She is thrilled with her progress post ablation Going to the gym regularly, her energy and exertional capacity Is great, she has lost weight and through evaluatinos at the gym > less fat/more muscle  No CP, palpitations She did have extensive ecchymosis post ablation she was not prepared for, though resolved and well healed sites No near syncope or syncope  She does have some degree of SOB chronically > follows with pulm ? Long COVID ? Had traumatic chest wall injury 2 years ago that resulted in collapsed lung > resolved ?enlarged pulmonary artery with some mention of possible p/HTN   Arrhythmia/AAD hx AFib found 2022 Flecainide started late 2022 >> failed to maintain SR stopped Nov 2024 AFib ablation 08/08/23 (PFA)  Studies Reviewed: Marland Kitchen    EKG done today and reviewed by myself:  SR 64bpm   08/08/23: EPS/ablation CONCLUSIONS: 1. Sinus rhythm upon presentation.   2. Successful electrical ablation of all four pulmonary veins with pulse field energy. 3. Successful ablation of the posterior wall of the left atrium with  pulsed field energy 4. No inducible arrhythmias following ablation  5. No early apparent complications.   TTE 01/02/2022 EF 65-70%. Normal LA size   ETT 08/12/21 No evidence of ischemia   Risk Assessment/Calculations:    Physical Exam:   VS:  There were no vitals taken for this visit.   Wt Readings from Last 3 Encounters:  09/10/23 186 lb (84.4 kg)  09/07/23 186 lb (84.4 kg)  08/08/23 190 lb (86.2 kg)    GEN: Well nourished, well developed in no acute distress NECK: No JVD; No carotid bruits CARDIAC: RRR, no murmurs, rubs, gallops RESPIRATORY:  CTA b/l without rales, wheezing or rhonchi  ABDOMEN: Soft, non-tender, non-distended EXTREMITIES:  No edema; No deformity   ASSESSMENT AND PLAN: .    persistent AFib CHA2DS2Vasc is 2 (including gender/aortic atherosclerosis) On eliquis, appropriately dosed  She would like to come off Eliquis > says since she started it she is plaghued every day particularly at night with b/l feet/calf cramping though acn also get b/l hand cramps that she attributes to the Eliquis.  She turns 64 Feb 2026 Discussed risk score > once 64 would change I am hesitant to stop her [facility] She had no symptoms with the AFib but checks her HR daily with her Terri Drake Discussed would ask Dr. Nelly Drake for his recommendation She would be agreeable to try another [facility] agent of he felt strongly she should stay on though her preference would be to come off a/c all together   Secondary hypercoagulable state 2/2 AFib  Dispo: back in 6 mo, sooner if needed  Signed, Terri Pigeon, PA-C

## 2023-11-06 ENCOUNTER — Ambulatory Visit: Payer: BC Managed Care – PPO | Admitting: Physician Assistant

## 2023-11-07 ENCOUNTER — Ambulatory Visit: Payer: 59 | Attending: Physician Assistant | Admitting: Physician Assistant

## 2023-11-07 ENCOUNTER — Encounter: Payer: Self-pay | Admitting: Physician Assistant

## 2023-11-07 VITALS — BP 120/84 | HR 64 | Ht 67.0 in | Wt 184.0 lb

## 2023-11-07 DIAGNOSIS — D6869 Other thrombophilia: Secondary | ICD-10-CM

## 2023-11-07 DIAGNOSIS — I4819 Other persistent atrial fibrillation: Secondary | ICD-10-CM | POA: Diagnosis not present

## 2023-11-07 NOTE — Patient Instructions (Signed)
Medication Instructions:   Your physician recommends that you continue on your current medications as directed. Please refer to the Current Medication list given to you today.  *If you need a refill on your cardiac medications before your next appointment, please call your pharmacy*   Lab Work: NONE ORDERED  TODAY   If you have labs (blood work) drawn today and your tests are completely normal, you will receive your results only by: MyChart Message (if you have MyChart) OR A paper copy in the mail If you have any lab test that is abnormal or we need to change your treatment, we will call you to review the results.   Testing/Procedures: NONE ORDERED  TODAY     Follow-Up: At [facility], you and your health needs are our priority.  As part of our continuing mission to provide you with exceptional heart care, we have created designated Provider Care Teams.  These Care Teams include your primary Cardiologist (physician) and Advanced Practice Providers (APPs -  Physician Assistants and Nurse Practitioners) who all work together to provide you with the care you need, when you need it.  We recommend signing up for the patient portal called "MyChart".  Sign up information is provided on this After Visit Summary.  MyChart is used to connect with patients for Virtual Visits (Telemedicine).  Patients are able to view lab/test results, encounter notes, upcoming appointments, etc.  Non-urgent messages can be sent to your provider as well.   To learn more about what you can do with MyChart, go to [email or web address].    Your next appointment:    6 month(s)  Provider:   Augustus Mealor, MD    Other Instructions  

## 2023-11-08 ENCOUNTER — Encounter: Payer: Self-pay | Admitting: Cardiovascular Disease

## 2023-11-08 ENCOUNTER — Other Ambulatory Visit: Payer: Self-pay | Admitting: *Deleted

## 2024-01-29 ENCOUNTER — Telehealth: Payer: Self-pay | Admitting: *Deleted

## 2024-01-29 DIAGNOSIS — R942 Abnormal results of pulmonary function studies: Secondary | ICD-10-CM

## 2024-01-29 DIAGNOSIS — J984 Other disorders of lung: Secondary | ICD-10-CM

## 2024-01-29 DIAGNOSIS — R0602 Shortness of breath: Secondary | ICD-10-CM

## 2024-01-29 NOTE — Telephone Encounter (Signed)
 Copied from CRM [id number]. Topic: Clinical - Medical Advice >> Jan 28, 2024  4:44 PM Tyronne Galloway wrote: Reason for CRM: Pt wanted to know if Dr. Diania Fortes still wants her on the fluticasone  furoate-vilanterol (BREO ELLIPTA ) 200-25 MCG/ACT AEPB. If he does, pt is asking for 3 months of thel medication due to traveling in Sept and Oct of 2025. Please call the pt back at [phone] ok to leave a vm.

## 2024-01-29 NOTE — Telephone Encounter (Signed)
 Pt called about getting her CT Chest scheduled for Nov 2025  Last ov note does mention CT  I was not sure if wanted to have her do HRCT for plain CT Chest without  Please advise and will place the order  Thanks!

## 2024-01-29 NOTE — Telephone Encounter (Signed)
 Copied from CRM [id number]. Topic: Clinical - Request for Lab/Test Order >> Jan 28, 2024  4:42 PM Tyronne Galloway wrote: Reason for CRM: Pt stated Dr. Diania Fortes wanted her to complete a CT scan before her yearly follow up appt in Nov/Dec of 2025. I did not see an order for a CT. Pt's phone number [phone] ok to leave a vm.

## 2024-01-30 NOTE — Telephone Encounter (Signed)
 Wilfredo Hanly, MD to Me  Lbpu Triage [facility]    01/29/24  5:28 PM Please order HRCT Chest.  Thanks, JD  HRCT was ordered  I have sent mychart msg to the pt letting her know

## 2024-02-01 ENCOUNTER — Other Ambulatory Visit: Payer: Self-pay

## 2024-02-01 ENCOUNTER — Telehealth: Payer: Self-pay

## 2024-02-01 DIAGNOSIS — J984 Other disorders of lung: Secondary | ICD-10-CM

## 2024-02-01 NOTE — Telephone Encounter (Unsigned)
 Copied from CRM [id number]. Topic: Clinical - Prescription Issue >> Feb 01, 2024 12:49 PM Isabell A wrote: Reason for CRM: Patient requesting a call back stating she only has 7 pumps left of Breo Ellipta  - if not needed, she would like a call back. If needed she is requesting for 3 months of the medication due to upcoming travel in Sept-Oct.    Callback number: [phone]   Tried to call patient home phone no pick up not answering machine available to leave message and no answer cell mailbox full could not leave message.   (Inhaler refills sent to pharmacy).

## 2024-02-04 MED ORDER — FLUTICASONE FUROATE-VILANTEROL 200-25 MCG/ACT IN AEPB: 1.0000 | INHALATION_SPRAY | Freq: Every day | RESPIRATORY_TRACT | 3 refills | Status: DC

## 2024-02-04 NOTE — Telephone Encounter (Signed)
 Yes, she is to continue her inhaler as it has reportedly helped her cough and breathing.  Refills sent to her pharmacy.  JD

## 2024-02-05 NOTE — Telephone Encounter (Signed)
 LM on home machine okay per HIPAA. NFN

## 2024-03-26 ENCOUNTER — Telehealth: Payer: Self-pay

## 2024-03-26 NOTE — Telephone Encounter (Signed)
 Copied from CRM #[id number]. Topic: Appointments - Appointment Scheduling >> Mar 24, 2024  4:17 PM Terri Drake wrote: Patient/patient representative is calling to schedule an appointment. Refer to attachments for appointment information.  Patient would like to talk to someone about her brio medication, please call patient.    LM- for patient to return call / she has an up coming ppt.  If she would like for further discuss INH then

## 2024-06-03 ENCOUNTER — Ambulatory Visit: Attending: Cardiovascular Disease | Admitting: Cardiovascular Disease

## 2024-06-03 ENCOUNTER — Encounter: Payer: Self-pay | Admitting: Cardiovascular Disease

## 2024-06-03 VITALS — BP 140/86 | HR 67 | Ht 67.0 in | Wt 172.8 lb

## 2024-06-03 DIAGNOSIS — I4819 Other persistent atrial fibrillation: Secondary | ICD-10-CM

## 2024-06-03 NOTE — Patient Instructions (Signed)
 Medication Instructions:  Your physician recommends that you continue on your current medications as directed. Please refer to the Current Medication list given to you today.  *If you need a refill on your cardiac medications before your next appointment, please call your pharmacy*  Lab Work: None ordered.  If you have labs (blood work) drawn today and your tests are completely normal, you will receive your results only by: MyChart Message (if you have MyChart) OR A paper copy in the mail If you have any lab test that is abnormal or we need to change your treatment, we will call you to review the results.  Testing/Procedures: None ordered.   Follow-Up: At [facility], you and your health needs are our priority.  As part of our continuing mission to provide you with exceptional heart care, our providers are all part of one team.  This team includes your primary Cardiologist (physician) and Advanced Practice Providers or APPs (Physician Assistants and Nurse Practitioners) who all work together to provide you with the care you need, when you need it.  Your next appointment:   6 months with Afib clinic

## 2024-06-03 NOTE — Progress Notes (Signed)
 Electrophysiology Office Note:    Date:  06/03/2024   ID:  Terri Drake, DOB 06/30/1961, MRN [id number]  PCP:  Aisha Harvey, MD   [facility] Providers Cardiologist:  Powell FORBES Sorrow, MD (Inactive) Cardiology APP:  Nellene Quita SAUNDERS, [place]  Electrophysiologist:  Eulas FORBES Furbish, MD     Referring MD: Aisha Harvey, MD   History of Present Illness:    Terri Drake is a 63 y.o. female with a hx listed below, significant for atrial fibrillation, referred for arrhythmia management.  She was initially diagnosed with atrial fibrillation in May 2022 during follow-up with her primary care physician.  She had rapid ventricular rates, but was unaware of her arrhythmia.  She presented to the emergency department in August 2022 with symptoms of elevated heart rate, nausea and lightheadedness.  Her beta-blocker dose was increased.  She was started on flecainide  in late 2022.  The dose of was increased to 100 mg twice daily.  She has continued to have episodes of atrial fibrillation.  Most recent was in March.  They typically last several hours.  She is asymptomatic, but her heart rates average greater than 120 bpm.  She is a professor of anthropology.  She reports that she has not had any evidence of atrial fibrillation since her ablation.  She uses her Crist mobile intermittently to check.  She never had significant symptoms with A-fib, however.  She has discontinued apixaban  since her CHA2DS2-VASc score is 2 for vascular disease and female sex.  Of primary concern to her, she has had intermittent, bilateral leg and thigh cramping since her ablation.  She has tried periods remedies --hydration, electrolytes, but nothing seems to help.  She attributes the increase in cramps to something that occurred during her ablation.    EKGs/Labs/Other Studies Reviewed Today:    Echocardiogram:  TTE 01/02/2022 EF 65-70%. Normal LA size   Monitors:   Stress testing:  ETT  08/12/21 No evidence of ischemia  Advanced imaging:   Cardiac catherization   EKG:  Last EKG results: today - sinus bradycardia, HR 49 bpm   Recent Labs: 07/16/2023: BUN 13; Creatinine, Ser 0.92; Hemoglobin 14.3; Platelets 245; Sodium 138 07/17/2023: Potassium 4.9     Physical Exam:    VS:  There were no vitals taken for this visit.    Wt Readings from Last 3 Encounters:  11/07/23 184 lb (83.5 kg)  09/10/23 186 lb (84.4 kg)  09/07/23 186 lb (84.4 kg)     GEN: Well nourished, well developed in no acute distress CARDIAC: RRR, no murmurs, rubs, gallops RESPIRATORY:  Normal work of breathing MUSCULOSKELETAL: no edema    ASSESSMENT & PLAN:    Paroxysmal Atrial fibrillation On flecainide  100mg  BID with occasional breakthrough episodes of AF She is asymptomatic in AF but V-rates are uncontrolled Be cause she has failed flecainide  and has Tachy-brady, I recommended ablation. She underwent atrial fibrillation ablation on August 08, 2023 Flecainide  was discontinued after ablation  Secondary hypercoagulable state CHA2DS2-VASc score is 2 for female, vascular disease She will turn 78 in February, and her CHADS2Vasc score will be 3 I advised her that she would need to resume anticoagulation at this time unless we are reassured that she has not had any recurrence of atrial fibrillation as she is asymptomatic of her A-fib I recommended she either get an FDA-approved device for monitoring atrial fibrillation such as an Apple watch, or consider an implantable loop monitor She is not interested in having an implantable  loop monitor. I have requested that she either call us  to start Eliquis  when she turns 64 or obtain an approved wearable monitor for atrial fibrillation  Tachy-brady Sinus rates in the 40's on flecainide  100 and metoprolol  12.5 She is fatigued in normal rhythm on her current regimen Will plan to DC flecainide  and metoprolol  after the ablation  Sinus  bradycardia Off antichronotropic medications She has tachy-brady syndrome managed with rhythm control   Cramping  This began after her ablation  She has intermittent, bilateral leg cramps I have not been able to find an association between cramping and AF ablation in the literature -- particularly bilateral cramping. It would be a very rare and unlikely possibility that flecainide  was treating her cause of leg cramps (eg some skeletal muscle Na+ channelopathy)         Medication Adjustments/Labs and Tests Ordered: Current medicines are reviewed at length with the patient today.  Concerns regarding medicines are outlined above.  Orders Placed This Encounter  Procedures   EKG 12-Lead   No orders of the defined types were placed in this encounter.    Signed, Eulas FORBES Furbish, MD  06/03/2024 8:57 AM    [facility]

## 2024-07-03 ENCOUNTER — Ambulatory Visit
Admission: RE | Admit: 2024-07-03 | Discharge: 2024-07-03 | Disposition: A | Source: Ambulatory Visit | Attending: Pulmonary Disease

## 2024-07-03 DIAGNOSIS — R942 Abnormal results of pulmonary function studies: Secondary | ICD-10-CM

## 2024-07-03 DIAGNOSIS — R0602 Shortness of breath: Secondary | ICD-10-CM

## 2024-07-03 DIAGNOSIS — J984 Other disorders of lung: Secondary | ICD-10-CM

## 2024-07-29 ENCOUNTER — Encounter: Payer: Self-pay | Admitting: Pulmonary Disease

## 2024-07-29 ENCOUNTER — Telehealth: Payer: Self-pay | Admitting: Pulmonary Disease

## 2024-07-29 ENCOUNTER — Ambulatory Visit: Admitting: Pulmonary Disease

## 2024-07-29 VITALS — BP 118/62 | HR 68 | Ht 67.0 in | Wt 169.8 lb

## 2024-07-29 DIAGNOSIS — R931 Abnormal findings on diagnostic imaging of heart and coronary circulation: Secondary | ICD-10-CM | POA: Diagnosis not present

## 2024-07-29 DIAGNOSIS — I288 Other diseases of pulmonary vessels: Secondary | ICD-10-CM

## 2024-07-29 DIAGNOSIS — I10 Essential (primary) hypertension: Secondary | ICD-10-CM

## 2024-07-29 DIAGNOSIS — J984 Other disorders of lung: Secondary | ICD-10-CM

## 2024-07-29 NOTE — Telephone Encounter (Signed)
 Error

## 2024-07-29 NOTE — Progress Notes (Signed)
 "  Established Patient Pulmonology Office Visit   Subjective:  Patient ID: Terri Drake, female    DOB: 20-Jun-1961  MRN: [id number]  CC:  Chief Complaint  Patient presents with   Medical Management of Chronic Issues    Pt states no well has question     Discussed the use of AI scribe software for clinical note transcription with the patient, who gave verbal consent to proceed.  History of Present Illness Terri Drake is a 63 year old female with atrial fibrillation and small airways disease who presents for follow up.  She reports significant recent weight loss and was able to hike in [place] at elevations up to twelve thousand feet without atrial fibrillation or limiting dyspnea. She monitors her heart and respiratory rate with a Fitbit and notes stable resting heart rates around the high 50s to high 60s, without detected atrial fibrillation over this period.  She uses Breo one puff daily for small airways disease with prior imaging showing minimal air trapping. She is generally adherent and has not noticed clear symptom change with occasional missed doses. She does not use albuterol. She has frequent cough, worse at night, with intermittent dyspnea and reports loss of singing voice since an accident that she associates with onset of her breathing issues.  She recalls CT findings of an enlarged heart and pulmonic trunk, but prior echocardiograms and pulmonary function tests did not show pulmonary hypertension. Her blood pressure is now better controlled, recently as low as 118 systolic. She is on multiple glaucoma eye drops for increasing intraocular pressure.  She exercises regularly, including treadmill walking 2.5 to 3 miles and weight training three times weekly. She may cough with exertion but denies significant exertional shortness of breath or gasping. She stopped metoprolol , which has allowed higher exercise heart rates without adverse cardiopulmonary symptoms.  She is  concerned about her sleep breathing pattern because Fitbit readings suggest infrequent breathing and shortness of breath at night. She denies snoring or waking up gasping. She is retired and reiterates that she tolerated extensive hiking in [place] without meaningful respiratory limitation.        ROS    Current Outpatient Medications:    acetaminophen  (TYLENOL ) 500 MG tablet, Take 500 mg by mouth every 6 (six) hours as needed for moderate pain (pain score 4-6)., Disp: , Rfl:    Biotin 5000 MCG TABS, Take 5,000 mcg by mouth daily., Disp: , Rfl:    Cholecalciferol (VITAMIN D3) 50 MCG (2000 UT) CAPS, Take 4,000 Units by mouth daily., Disp: , Rfl:    ELIQUIS  5 MG TABS tablet, Take 5 mg by mouth 2 (two) times daily., Disp: , Rfl:    latanoprost  (XALATAN ) 0.005 % ophthalmic solution, Place 1 drop into both eyes at bedtime., Disp: , Rfl:    losartan (COZAAR) 50 MG tablet, Take 50 mg by mouth daily., Disp: , Rfl:    magnesium  gluconate (MAGONATE) 500 MG tablet, Take 0.5 tablets (250 mg total) by mouth at bedtime. (Patient taking differently: Take 500 mg by mouth at bedtime.), Disp: 30 tablet, Rfl: 0   rosuvastatin (CRESTOR) 20 MG tablet, 1 tablet Orally Once a day, Disp: , Rfl:    SIMBRINZA 1-0.2 % SUSP, Apply 1 drop to eye 2 (two) times daily., Disp: , Rfl:    fluticasone  furoate-vilanterol (BREO ELLIPTA ) 100-25 MCG/ACT AEPB, Inhale 1 puff into the lungs daily., Disp: 30 each, Rfl: 11  Current Facility-Administered Medications:    sodium chloride  (PF) 0.9 % injection  2 mL, 2 mL, Intravenous, Daily, Raulkar, Sven SQUIBB, MD, 2 mL at 12/08/22 1024      Objective:  BP 118/62   Pulse 68   Ht 5' 7 (1.702 m) Comment: per pt  Wt 169 lb 12.8 oz (77 kg)   SpO2 99%   BMI 26.59 kg/m   Wt Readings from Last 3 Encounters:  07/29/24 169 lb 12.8 oz (77 kg)  06/03/24 172 lb 12.8 oz (78.4 kg)  11/07/23 184 lb (83.5 kg)   BMI Readings from Last 3 Encounters:  07/29/24 26.59 kg/m  06/03/24  27.06 kg/m  11/07/23 28.82 kg/m    Physical Exam Constitutional:      General: She is not in acute distress.    Appearance: Normal appearance.  Eyes:     General: No scleral icterus.    Conjunctiva/sclera: Conjunctivae normal.  Cardiovascular:     Rate and Rhythm: Normal rate and regular rhythm.  Pulmonary:     Breath sounds: No wheezing, rhonchi or rales.  Musculoskeletal:     Right lower leg: No edema.     Left lower leg: No edema.  Skin:    General: Skin is warm and dry.  Neurological:     General: No focal deficit present.      Diagnostic Review:    HRCT Chest 07/03/24 1. No evidence of interstitial lung disease. 2. Minimal air trapping is indicative of small airways disease. 3. Aortic atherosclerosis (ICD10-I70.0). Coronary artery calcification. 4. Enlarged pulmonic trunk, indicative of pulmonary arterial hypertension.    Assessment & Plan:   Assessment & Plan Small airways disease  Orders:   Pulmonary Function Test; Future  Enlarged pulmonary artery (HCC)  Orders:   Home sleep test; Future  Hypertension, unspecified type  Orders:   Home sleep test; Future   Assessment and Plan Assessment & Plan Small airways disease Minimal air trapping on CT scan. Persistent cough and occasional dyspnea. Breo inhaler may cause hoarseness. - Consider trial off Breo after current supply to assess impact on voice and symptoms. - Monitor for increased cough, wheezing, or dyspnea during trial off Breo. - If symptoms worsen, consider restarting Breo at lower dose.  Enlarged pulmonary artery CT shows enlarged pulmonary artery. No evidence of pulmonary hypertension on previous tests. Differential includes pulmonary hypertension and sleep apnea. - Ordered home sleep study for sleep apnea and nocturnal oxygen desaturation. - Scheduled pulmonary function tests. - Consider echocardiogram to evaluate heart function and pulmonary artery size.  Hypertension Blood  pressure readings up to 168/93 mmHg. Current medication does not affect heart rate. Possible contribution from sleep apnea or pulmonary hypertension. - Continue current antihypertensive regimen. - Monitor blood pressure at home. - Evaluate for sleep apnea with home sleep study.      Return in about 3 months (around 10/27/2024) for f/u visit Dr. Kara.   Dorn KATHEE Kara, MD "

## 2024-07-29 NOTE — Patient Instructions (Addendum)
 Schedule pulmonary function tests at the front desk   We will schedule you for home sleep study to evaluate for sleep apnea   Stop breo inhaler  Monitor for improvement in your voice. Also monitor for shortness of breath, cough, wheezing or chest tightness. If these symptoms return, then we can resume Breo inhaler at low dose.  Your CT Chest does not indicate interstitial lung disease  Follow up in 3 months

## 2024-08-05 ENCOUNTER — Encounter: Payer: Self-pay | Admitting: Pulmonary Disease

## 2024-08-05 ENCOUNTER — Telehealth: Payer: Self-pay

## 2024-08-06 NOTE — Telephone Encounter (Signed)
 Hi Terri Drake,  The inhaler will not affect your blood pressure like you are noticing.   I agree with your primary care team about increasing your blood pressure medicine as you have.   High blood pressure could be from untreated sleep apnea. The sleep study will help diagnosis sleep apnea if happening.   As long as you are not having shortness of breath, cough, wheezing or chest tightness then I wouldn't worry about needing the inhaler.  Thanks, JD

## 2024-08-07 ENCOUNTER — Telehealth: Payer: Self-pay

## 2024-08-07 NOTE — Telephone Encounter (Signed)
 X1 VM/ LM return call

## 2024-08-07 NOTE — Telephone Encounter (Signed)
 Copied from CRM [id number]. Topic: Clinical - Medical Advice >> Aug 07, 2024  8:28 AM Isabell A wrote: Reason for CRM: Patient returning phone call from [place].   Callback number: [phone]   Tried to reach out to patient VM/ LM to return call    ( Please see patient message from Dr. Kara  12/16)

## 2024-08-08 ENCOUNTER — Ambulatory Visit: Payer: Self-pay

## 2024-08-08 ENCOUNTER — Encounter: Payer: Self-pay | Admitting: Pulmonary Disease

## 2024-08-08 ENCOUNTER — Telehealth: Payer: Self-pay

## 2024-08-08 MED ORDER — FLUTICASONE FUROATE-VILANTEROL 100-25 MCG/ACT IN AEPB: 1.0000 | INHALATION_SPRAY | Freq: Every day | RESPIRATORY_TRACT | 11 refills | Status: AC

## 2024-08-08 NOTE — Telephone Encounter (Signed)
 Changed to NT encounter.   Copied from CRM #[id number]. Topic: Clinical - Medical Advice >> Aug 08, 2024 11:47 AM Ismael A wrote: Reason for CRM: pt returning call from [place], I relayed this message Hi Erynn,   The inhaler will not affect your blood pressure like you are noticing.    I agree with your primary care team about increasing your blood pressure medicine as you have.    High blood pressure could be from untreated sleep apnea. The sleep study will help diagnosis sleep apnea if happening.    As long as you are not having shortness of breath, cough, wheezing or chest tightness then I wouldn't worry about needing the inhaler.   Thanks, JD   She mentioned she coughing and having occasional chest tightness - pt would like any follow ups/updates on this

## 2024-08-08 NOTE — Telephone Encounter (Signed)
 Copied from CRM #[id number]. Topic: Clinical - Medical Advice >> Aug 08, 2024 11:47 AM Ismael A wrote: Reason for CRM: pt returning call from [place], I relayed this message Hi Emiko,   The inhaler will not affect your blood pressure like you are noticing.    I agree with your primary care team about increasing your blood pressure medicine as you have.    High blood pressure could be from untreated sleep apnea. The sleep study will help diagnosis sleep apnea if happening.    As long as you are not having shortness of breath, cough, wheezing or chest tightness then I wouldn't worry about needing the inhaler.   Thanks, JD   She mentioned she coughing and having occasional chest tightness - pt would like any follow ups/updates on this

## 2024-08-08 NOTE — Telephone Encounter (Signed)
 Attempt # 1 to reach patient to triage symptoms. Left VM stating:  Dr Kara has sent in a lower dose of the Breo-Ellipta inhaler to try to help with your side effects of coughing and chest tightness. Please call back if you have any questions or worsening symptoms.

## 2024-08-08 NOTE — Telephone Encounter (Signed)
 Lower dose breo sent in  JD

## 2024-08-08 NOTE — Addendum Note (Signed)
 Addended by: Saburo Luger on: 08/08/2024 11:58 AM   Modules accepted: Orders

## 2024-08-12 ENCOUNTER — Encounter

## 2024-08-12 DIAGNOSIS — I10 Essential (primary) hypertension: Secondary | ICD-10-CM

## 2024-08-12 DIAGNOSIS — I288 Other diseases of pulmonary vessels: Secondary | ICD-10-CM

## 2024-08-25 DIAGNOSIS — R069 Unspecified abnormalities of breathing: Secondary | ICD-10-CM | POA: Diagnosis not present

## 2024-08-27 ENCOUNTER — Encounter (HOSPITAL_BASED_OUTPATIENT_CLINIC_OR_DEPARTMENT_OTHER): Payer: Self-pay | Admitting: Pulmonary Disease

## 2024-08-28 ENCOUNTER — Ambulatory Visit: Payer: Self-pay | Admitting: Pulmonary Disease

## 2024-08-29 ENCOUNTER — Other Ambulatory Visit: Payer: Self-pay

## 2024-08-29 ENCOUNTER — Telehealth: Payer: Self-pay

## 2024-08-29 DIAGNOSIS — G4733 Obstructive sleep apnea (adult) (pediatric): Secondary | ICD-10-CM

## 2024-08-29 NOTE — Telephone Encounter (Signed)
 Copied from CRM [id number]. Topic: Clinical - Lab/Test Results >> Aug 29, 2024  8:56 AM Joesph PARAS wrote: Reason for CRM: Patient has recalled question for nurse. Patient would like to know if she still needs to use her inhaler and if not, what will the impact of coming off the inhaler be on her breathing.  Patient OK with reply by text/mychart.    Spoke with patient VBU, advise to continue use until all procedures are done and discuss stop at next f/u

## 2024-08-29 NOTE — Telephone Encounter (Signed)
 Spoke with patient VBU, choose to Cpap titration -   Has a few questions  ( told her we can schedule an appointment after her WL)  BP, noticed changes.  she has been keeping track of this wondering if its the OSA causing issues with her blood pressure, etc.

## 2024-10-28 ENCOUNTER — Ambulatory Visit (HOSPITAL_BASED_OUTPATIENT_CLINIC_OR_DEPARTMENT_OTHER): Admitting: Pulmonary Disease

## 2024-11-05 ENCOUNTER — Ambulatory Visit: Admitting: Pulmonary Disease

## 2024-11-05 ENCOUNTER — Encounter

## 2024-12-02 ENCOUNTER — Ambulatory Visit (HOSPITAL_COMMUNITY): Admitting: Physician Assistant
